# Patient Record
Sex: Male | Born: 1935 | Race: White | Hispanic: No | Marital: Married | Smoking: Never smoker
Health system: Southern US, Community
[De-identification: ages and names within clinical notes are randomized; demographics above are authoritative.]

## PROBLEM LIST (undated history)

## (undated) DIAGNOSIS — I498 Other specified cardiac arrhythmias: Secondary | ICD-10-CM

## (undated) DIAGNOSIS — R001 Bradycardia, unspecified: Secondary | ICD-10-CM

## (undated) DIAGNOSIS — J84112 Idiopathic pulmonary fibrosis: Secondary | ICD-10-CM

## (undated) DIAGNOSIS — R002 Palpitations: Secondary | ICD-10-CM

## (undated) DIAGNOSIS — R911 Solitary pulmonary nodule: Secondary | ICD-10-CM

## (undated) DIAGNOSIS — K219 Gastro-esophageal reflux disease without esophagitis: Secondary | ICD-10-CM

## (undated) DIAGNOSIS — I10 Essential (primary) hypertension: Secondary | ICD-10-CM

## (undated) DIAGNOSIS — Z8551 Personal history of malignant neoplasm of bladder: Secondary | ICD-10-CM

## (undated) DIAGNOSIS — E785 Hyperlipidemia, unspecified: Secondary | ICD-10-CM

## (undated) HISTORY — DX: Other specified cardiac arrhythmias: I49.8

## (undated) HISTORY — PX: CYSTOSCOPY: SUR368

## (undated) HISTORY — DX: Idiopathic pulmonary fibrosis: J84.112

## (undated) HISTORY — DX: Personal history of malignant neoplasm of bladder: Z85.51

## (undated) HISTORY — DX: Essential (primary) hypertension: I10

## (undated) HISTORY — DX: Bradycardia, unspecified: R00.1

## (undated) HISTORY — DX: Hyperlipidemia, unspecified: E78.5

## (undated) HISTORY — DX: Palpitations: R00.2

## (undated) HISTORY — DX: Solitary pulmonary nodule: R91.1

## (undated) HISTORY — DX: Gastro-esophageal reflux disease without esophagitis: K21.9

---

## 2000-03-28 ENCOUNTER — Ambulatory Visit (HOSPITAL_COMMUNITY): Admission: RE | Admit: 2000-03-28 | Discharge: 2000-03-28 | Payer: Self-pay | Admitting: Gastroenterology

## 2006-07-16 ENCOUNTER — Encounter: Admission: RE | Admit: 2006-07-16 | Discharge: 2006-07-16 | Payer: Self-pay | Admitting: Emergency Medicine

## 2006-08-02 ENCOUNTER — Ambulatory Visit: Payer: Self-pay | Admitting: Internal Medicine

## 2006-08-12 ENCOUNTER — Ambulatory Visit (HOSPITAL_COMMUNITY): Admission: RE | Admit: 2006-08-12 | Discharge: 2006-08-12 | Payer: Self-pay | Admitting: Internal Medicine

## 2006-08-14 ENCOUNTER — Ambulatory Visit: Payer: Self-pay | Admitting: Internal Medicine

## 2006-08-26 ENCOUNTER — Ambulatory Visit: Payer: Self-pay | Admitting: Internal Medicine

## 2006-11-25 ENCOUNTER — Ambulatory Visit: Payer: Self-pay | Admitting: Internal Medicine

## 2007-03-06 DIAGNOSIS — J479 Bronchiectasis, uncomplicated: Secondary | ICD-10-CM

## 2007-03-06 DIAGNOSIS — J8410 Pulmonary fibrosis, unspecified: Secondary | ICD-10-CM

## 2007-03-07 ENCOUNTER — Ambulatory Visit: Payer: Self-pay | Admitting: Internal Medicine

## 2007-06-04 ENCOUNTER — Ambulatory Visit: Payer: Self-pay | Admitting: Internal Medicine

## 2007-06-04 DIAGNOSIS — I10 Essential (primary) hypertension: Secondary | ICD-10-CM

## 2007-06-04 DIAGNOSIS — E785 Hyperlipidemia, unspecified: Secondary | ICD-10-CM

## 2007-06-04 DIAGNOSIS — K219 Gastro-esophageal reflux disease without esophagitis: Secondary | ICD-10-CM | POA: Insufficient documentation

## 2007-06-05 ENCOUNTER — Encounter: Payer: Self-pay | Admitting: Internal Medicine

## 2007-12-02 ENCOUNTER — Ambulatory Visit: Payer: Self-pay | Admitting: Internal Medicine

## 2007-12-16 ENCOUNTER — Ambulatory Visit (HOSPITAL_BASED_OUTPATIENT_CLINIC_OR_DEPARTMENT_OTHER): Admission: RE | Admit: 2007-12-16 | Discharge: 2007-12-16 | Payer: Self-pay | Admitting: Internal Medicine

## 2007-12-16 ENCOUNTER — Encounter: Payer: Self-pay | Admitting: Internal Medicine

## 2007-12-27 ENCOUNTER — Ambulatory Visit: Payer: Self-pay | Admitting: Internal Medicine

## 2008-01-01 ENCOUNTER — Ambulatory Visit: Payer: Self-pay | Admitting: Internal Medicine

## 2008-01-01 DIAGNOSIS — R0602 Shortness of breath: Secondary | ICD-10-CM

## 2008-01-15 ENCOUNTER — Telehealth (INDEPENDENT_AMBULATORY_CARE_PROVIDER_SITE_OTHER): Payer: Self-pay | Admitting: *Deleted

## 2008-02-18 ENCOUNTER — Ambulatory Visit: Payer: Self-pay | Admitting: Pulmonary Disease

## 2008-05-18 ENCOUNTER — Ambulatory Visit: Payer: Self-pay | Admitting: Pulmonary Disease

## 2008-07-02 ENCOUNTER — Ambulatory Visit: Payer: Self-pay | Admitting: Internal Medicine

## 2008-07-02 LAB — CONVERTED CEMR LAB
BUN: 15 mg/dL (ref 6–23)
Creatinine, Ser: 1.4 mg/dL (ref 0.4–1.5)
HCT: 42.6 % (ref 39.0–52.0)
MCHC: 35.7 g/dL (ref 30.0–36.0)
MCV: 87.3 fL (ref 78.0–100.0)
Monocytes Absolute: 0.9 10*3/uL (ref 0.1–1.0)
Monocytes Relative: 10 % (ref 3.0–12.0)
Neutrophils Relative %: 58.7 % (ref 43.0–77.0)

## 2008-07-05 ENCOUNTER — Ambulatory Visit: Payer: Self-pay | Admitting: Cardiology

## 2008-07-08 ENCOUNTER — Telehealth: Payer: Self-pay | Admitting: Internal Medicine

## 2008-07-12 ENCOUNTER — Telehealth: Payer: Self-pay | Admitting: Internal Medicine

## 2008-10-20 ENCOUNTER — Encounter (INDEPENDENT_AMBULATORY_CARE_PROVIDER_SITE_OTHER): Payer: Self-pay | Admitting: Urology

## 2008-10-20 ENCOUNTER — Ambulatory Visit (HOSPITAL_COMMUNITY): Admission: RE | Admit: 2008-10-20 | Discharge: 2008-10-20 | Payer: Self-pay | Admitting: Urology

## 2008-12-29 ENCOUNTER — Ambulatory Visit: Payer: Self-pay | Admitting: Internal Medicine

## 2009-01-06 LAB — CONVERTED CEMR LAB
Basophils Absolute: 0 10*3/uL (ref 0.0–0.1)
Basophils Relative: 0.1 % (ref 0.0–3.0)
HCT: 42.5 % (ref 39.0–52.0)
Hemoglobin: 15.1 g/dL (ref 13.0–17.0)
Lymphocytes Relative: 20 % (ref 12.0–46.0)
Lymphs Abs: 1.7 10*3/uL (ref 0.7–4.0)
MCHC: 35.7 g/dL (ref 30.0–36.0)
MCV: 87.7 fL (ref 78.0–100.0)
Monocytes Absolute: 1 10*3/uL (ref 0.1–1.0)
Neutro Abs: 5.5 10*3/uL (ref 1.4–7.7)
RBC: 4.84 M/uL (ref 4.22–5.81)
RDW: 12.8 % (ref 11.5–14.6)
WBC: 8.6 10*3/uL (ref 4.5–10.5)

## 2009-01-12 ENCOUNTER — Ambulatory Visit: Payer: Self-pay | Admitting: Internal Medicine

## 2009-02-28 ENCOUNTER — Ambulatory Visit: Payer: Self-pay | Admitting: Internal Medicine

## 2009-03-17 ENCOUNTER — Encounter: Payer: Self-pay | Admitting: Internal Medicine

## 2009-05-23 ENCOUNTER — Encounter: Payer: Self-pay | Admitting: Internal Medicine

## 2009-06-16 ENCOUNTER — Encounter (HOSPITAL_COMMUNITY): Admission: RE | Admit: 2009-06-16 | Discharge: 2009-09-14 | Payer: Self-pay | Admitting: Internal Medicine

## 2009-06-28 ENCOUNTER — Ambulatory Visit: Payer: Self-pay | Admitting: Internal Medicine

## 2009-07-25 HISTORY — PX: US ECHOCARDIOGRAPHY: HXRAD669

## 2009-07-29 HISTORY — PX: CARDIOVASCULAR STRESS TEST: SHX262

## 2009-08-22 ENCOUNTER — Encounter: Payer: Self-pay | Admitting: Internal Medicine

## 2009-09-15 ENCOUNTER — Encounter (HOSPITAL_COMMUNITY): Admission: RE | Admit: 2009-09-15 | Discharge: 2009-10-12 | Payer: Self-pay | Admitting: Internal Medicine

## 2009-10-18 ENCOUNTER — Encounter (HOSPITAL_COMMUNITY): Admission: RE | Admit: 2009-10-18 | Discharge: 2010-01-16 | Payer: Self-pay | Admitting: Internal Medicine

## 2009-10-31 ENCOUNTER — Ambulatory Visit: Payer: Self-pay | Admitting: Internal Medicine

## 2009-11-03 ENCOUNTER — Encounter: Payer: Self-pay | Admitting: Internal Medicine

## 2009-11-24 ENCOUNTER — Telehealth (INDEPENDENT_AMBULATORY_CARE_PROVIDER_SITE_OTHER): Payer: Self-pay | Admitting: *Deleted

## 2009-11-24 ENCOUNTER — Encounter: Payer: Self-pay | Admitting: Internal Medicine

## 2010-01-04 ENCOUNTER — Encounter: Payer: Self-pay | Admitting: Internal Medicine

## 2010-01-19 ENCOUNTER — Encounter (HOSPITAL_COMMUNITY)
Admission: RE | Admit: 2010-01-19 | Discharge: 2010-04-19 | Payer: Self-pay | Source: Home / Self Care | Admitting: Internal Medicine

## 2010-02-17 ENCOUNTER — Ambulatory Visit: Payer: Self-pay | Admitting: Internal Medicine

## 2010-04-20 ENCOUNTER — Encounter (HOSPITAL_COMMUNITY)
Admission: RE | Admit: 2010-04-20 | Discharge: 2010-06-20 | Payer: Self-pay | Source: Home / Self Care | Attending: Internal Medicine | Admitting: Internal Medicine

## 2010-05-01 ENCOUNTER — Ambulatory Visit: Payer: Self-pay | Admitting: Internal Medicine

## 2010-06-08 ENCOUNTER — Ambulatory Visit
Admission: RE | Admit: 2010-06-08 | Discharge: 2010-06-08 | Payer: Self-pay | Source: Home / Self Care | Attending: Internal Medicine | Admitting: Internal Medicine

## 2010-06-08 DIAGNOSIS — J209 Acute bronchitis, unspecified: Secondary | ICD-10-CM | POA: Insufficient documentation

## 2010-06-20 NOTE — Letter (Signed)
Summary: External Correspondence  External Correspondence   Imported By: Valinda Hoar 11/24/2009 14:04:55  _____________________________________________________________________  External Attachment:    Type:   Image     Comment:   External Document

## 2010-06-20 NOTE — Assessment & Plan Note (Signed)
Summary: 4 months/apc   Primary Provider/Referring Provider:  Lajean Manes  CC:  4 month followup.  Pt states his breathing is the same- no better or worse.  No new complaints today.  Pt states that he has only been using the spiriva as needed.[name]  History of Present Illness:  February 28, 2009- Bronchiectasis, RML Nodule, fibrosis.......................Marland Kitchenwife here Here for PFT review, feeling a little better in cool weather. Travelled to [place] and breathed better in the dry heat. Walked slowly on hills. After late golf game had increased cough overnight. PFT- Mild restriction, moderate reduction of diffusion, but normal flows. - 96%, 95, 97%, 465 m. Compared with PFT of 8/09, restriction is more evident, with pattern the same.  June 28, 2009- Pulmonary Fibrosis, bronchiectasis, RML Nodule..................................[name] Assessed at [facility] on my referral for ILD c/w pulmonary fibrosis. He reports maybe some increased cough with morning phlegm. Maybe a little more dyspnea noted with hills and steps. He says [facility] group felt dx Ideopathic Firosis firm without needing biopsy. He is waiting to hear about participation in a clinical trial. He had flu vax. He isn't sure if Spiriva does anything. He has started [facility]. Denies palpitation, ankle swelling, chest pain.  October 31, 2009- Pulmonary fibrosis, bronchiectasis, RML nodule. He feels stable. Going to Pulmonary Rehab. Also in a double blind clinical drug trial for IPF at [facility]. [facility] program is doing his PFT and CXR regularly. Dyspnea is unchanged. Main c/o is cough, dry, waking him and worse in the morning. Benzonatate not helping. He paces himself and avoids hills to control dyspnea. Plays golf 1-2x/ week.He uses Spiriva when needed.   Current Medications (verified): 1)  Atenolol 25 Mg  Tabs (Atenolol) .... One Qd 2)  Lipitor 80 Mg  Tabs (Atorvastatin Calcium) .... One Qd 3)  Micardis Hct 80-25 Mg  Tabs (Telmisartan-Hctz)  .... One Qd 4)  Prilosec 20 Mg  Cpdr (Omeprazole) .... Take 1 By Mouth Once Daily 5)  Spiriva Handihaler 18 Mcg Caps (Tiotropium Bromide Monohydrate) .... Inhale Contents of 1 Capsule Once A Day As Needed  Allergies (verified): No Known Drug Allergies  Past History:  Past Medical History: Last updated: 06/28/2009 R lung nodule No hx TB exposure/ neg PPD Ideopathic Pulmonaery Fibrosis - [facility]- no biopsy L Hilar node Hypertension Hyperlipidemia G E R D\par Bladder cancer- resected  Past Surgical History: Last updated: 06/28/2009 Bladder cancer resected via cystoscopy  Family History: Last updated: 06/04/2007 Coronary Heart Disease COPD:   Social History: Last updated: 06/04/2007 Patient never smoked.  retired Research officer, political party  Risk Factors: Smoking Status: never (06/04/2007)  Review of Systems      See HPI       The patient complains of shortness of breath with activity and non-productive cough.  The patient denies shortness of breath at rest, productive cough, coughing up blood, chest pain, irregular heartbeats, acid heartburn, indigestion, loss of appetite, weight change, abdominal pain, difficulty swallowing, sore throat, tooth/dental problems, headaches, nasal congestion/difficulty breathing through nose, and sneezing.    Vital Signs:  Patient profile:   76 year old male Weight:      185 pounds O2 Sat:      96 % on Room air Pulse rate:   53 / minute BP sitting:   128 / 74  (left arm)  Vitals Entered By: Vernie Murders (October 31, 2009 2:10 PM)  O2 Flow:  Room air  Physical Exam  Additional Exam:  General: A/Ox3; pleasant and cooperative, NAD, subdued affect,  SKIN: no rash, lesions NODES: no lymphadenopathy HEENT: [place]/AT, EOM- WNL, Conjuctivae- clear, PERRLA, TM-WNL, Nose- clear, Throat- clear and wnl NECK: Supple w/ fair ROM, JVD- none, normal carotid impulses w/o bruits Thyroid-  CHEST:  crackles to level of scapula bilaterally HEART: RRR, no m/g/r  heard ABDOMEN: Soft and nl;  ZOX:WRUE, nl pulses, no edema  NEURO: Grossly intact to observation      Impression & Recommendations:  Problem # 1:  PULMONARY NODULE, RIGHT MIDDLE LOBE (ICD-518.89)  This will be watched for now.He is getting imaged at [facility] for his fibrosis.  Problem # 2:  PULMONARY FIBROSIS (ICD-515) Clinically stable for now on drug trial. Cough is disruptive. Will give cough syurup.We don't document aspiration or other correctable process.  Problem # 3:  G E R D (ICD-530.81) Not describing active symptoms. Potential for aspiration addressed. His updated medication list for this problem includes:    Prilosec 20 Mg Cpdr (Omeprazole) .[name]... Take 1 by mouth once daily  Medications Added to Medication List This Visit: 1)  Spiriva Handihaler 18 Mcg Caps (Tiotropium bromide monohydrate) .... Inhale contents of 1 capsule once a day as needed 2)  Promethazine-codeine 6.25-10 Mg/53ml Syrp (Promethazine-codeine) .[name].. 1 teaspoon .four times a day as needed cough  Other Orders: Est. Patient Level IV (45409)  Patient Instructions: 1)  Please schedule a follow-up appointment in 6 months. 2)  Script for cough syrup to use if needed Prescriptions: PROMETHAZINE-CODEINE 6.25-10 MG/5ML SYRP (PROMETHAZINE-CODEINE) 1 teaspoon .four times a day as needed cough  #200 ml x 1   Entered and Authorized by:   Waymon Budge MD   Signed by:   Waymon Budge MD on 10/31/2009   Method used:   Print then Give to Patient   RxID:   [id number]

## 2010-06-20 NOTE — Assessment & Plan Note (Signed)
Summary: rov 4 months///kp   Primary Provider/Referring Provider:  Lajean Manes  CC:  4 month follow up visit-SOB at times and with Activity..  History of Present Illness:  12/29/08- bronchiectasis, RML nodule, fibrosis...........................Marland Kitchenwife here C/O no energy. Nap makes him feel worse. Coughing more with clear mucus x 2 months, more at night and early AM. Not interfering with sleep. Not much more short of breath. Denies chest pain/ palpitation .Last treadmill with Dr Patty Sermons 2 years ago. Nocturia x 3. Snores a lot. NPSG last year normal- AHI 2.5. Had hematuria- dx'd blader cancer- Dr Isabel Caprice. Treated locally excision/ topical chemo.Plays golf 1 day/ week.  February 28, 2009- Bronchiectasis, RML Nodule, fibrosis.......................Marland Kitchenwife here Here for PFT review, feeling a little better in cool weather. Travelled to [place] and breathed better in the dry heat. Walked slowly on hills. After late golf game had increased cough overnight. PFT- Mild restriction, moderate reduction of diffusion, but normal flows. - 96%, 95, 97%, 465 m. Compared with PFT of 8/09, restriction is more evident, with pattern the same.  June 28, 2009- Pulmonary Fibrosis, bronchiectasis, RML Nodule..................................[name] Assessed at [facility] on my referral for ILD c/w pulmonary fibrosis. He reports maybe some increased cough with morning phlegm. Maybe a little more dyspnea noted with hills and steps. He says [facility] group felt dx Ideopathic Firosis firm without needing biopsy. He is waiting to hear about participation in a clinical trial. He had flu vax. He isn't sure if Spiriva does anything. He has started [facility]. Denies palpitation, ankle swelling, chest pain.  Current Medications (verified): 1)  Atenolol 25 Mg  Tabs (Atenolol) .... One Qd 2)  Lipitor 80 Mg  Tabs (Atorvastatin Calcium) .... One Qd 3)  Micardis Hct 80-25 Mg  Tabs (Telmisartan-Hctz) .... One Qd 4)  Prilosec 20 Mg   Cpdr (Omeprazole) .... Take 1 By Mouth Once Daily 5)  Spiriva Handihaler 18 Mcg Caps (Tiotropium Bromide Monohydrate) .... Inhale Contents of 1 Capsule Once A Day  Allergies (verified): No Known Drug Allergies  Past History:  Family History: Last updated: 06/04/2007 Coronary Heart Disease COPD:   Social History: Last updated: 06/04/2007 Patient never smoked.  retired Research officer, political party  Risk Factors: Smoking Status: never (06/04/2007)  Past Medical History: R lung nodule No hx TB exposure/ neg PPD Ideopathic Pulmonaery Fibrosis - [facility]- no biopsy L Hilar node Hypertension Hyperlipidemia G E R D\par Bladder cancer- resected  Past Surgical History: Bladder cancer resected via cystoscopy  Review of Systems      See HPI       The patient complains of dyspnea on exertion and prolonged cough.  The patient denies anorexia, fever, weight loss, weight gain, vision loss, decreased hearing, hoarseness, chest pain, syncope, peripheral edema, headaches, hemoptysis, abdominal pain, and severe indigestion/heartburn.    Vital Signs:  Patient profile:   76 year old male Height:      68 inches Weight:      190.13 pounds BMI:     29.01 O2 Sat:      96 % on Room air Pulse rate:   57 / minute BP sitting:   132 / 74  (left arm) Cuff size:   regular  Vitals Entered By: Reynaldo Minium CMA (June 28, 2009 9:29 AM)  O2 Flow:  Room air  Physical Exam  Additional Exam:  General: A/Ox3; pleasant and cooperative, NAD, subdued affect,  SKIN: no rash, lesions NODES: no lymphadenopathy HEENT: [place]/AT, EOM- WNL, Conjuctivae- clear, PERRLA, TM-WNL, Nose- clear, Throat- clear  and wnl NECK: Supple w/ fair ROM, JVD- none, normal carotid impulses w/o bruits Thyroid-  CHEST:  crackles to level of scapula bilaterally HEART: RRR, no m/g/r heard ABDOMEN: Soft and nl;  ZOX:WRUE, nl pulses, no edema  NEURO: Grossly intact to observation      Impression & Recommendations:  Problem # 1:   PULMONARY FIBROSIS (ICD-515) IPF, 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 He has heard about single lung transplant and pirfenidone as options. For now he is waiting to hear about acceptance into a study trial at [facility]. I encouraged aerobic exercise, including his golf and the Pulmonary Rehab program. It is ok for him to try Spiriva on and off, a week at a time, to see if he thinks it helps any.  Other Orders: Est. Patient Level III (45409)  Patient Instructions: 1)  Please schedule a follow-up appointment in 4 months. 2)  OK to try Spiriva off and on, a week at a time. If you can't be sure that it helps with shortness of breath, then it probably isn't worth the money.

## 2010-06-20 NOTE — Letter (Signed)
Summary: Pulmonary/[facility]  Pulmonary/[facility]   Imported By: Sherian Rein 08/22/2009 12:06:12  _____________________________________________________________________  External Attachment:    Type:   Image     Comment:   External Document

## 2010-06-20 NOTE — Progress Notes (Signed)
Summary: Handicapped Placard forms  Phone Note Call from Patient   Caller: Patient Call For: [name] [name] Summary of Call: Pt came in; waiting in lobby to have Handicapped placard signed by CDY. attached to this note. Initial call taken by: Reynaldo Minium CMA,  November 24, 2009 11:24 AM  Follow-up for Phone Call        placard signed  and given to patient.  -Copy yo be scanned in chart .Kandice Hams [facility]  November 24, 2009 11:43 AM  Follow-up by: Kandice Hams CMA,  November 24, 2009 11:43 AM

## 2010-06-20 NOTE — Letter (Signed)
Summary: Pulmonology/[facility]  Pulmonology/[facility]   Imported By: Lester [place] 06/01/2009 11:05:30  _____________________________________________________________________  External Attachment:    Type:   Image     Comment:   External Document

## 2010-06-20 NOTE — Consult Note (Signed)
Summary: Pulmonary[facility]  Pulmonary/[facility]   Imported By: Lester [place] 11/11/2009 08:16:46  _____________________________________________________________________  External Attachment:    Type:   Image     Comment:   External Document

## 2010-06-20 NOTE — Assessment & Plan Note (Signed)
Summary: flu shot/ mbw  Nurse Visit   Allergies: No Known Drug Allergies  Orders Added: 1)  Flu Vaccine 67yrs + MEDICARE PATIENTS [Q2039] 2)  Administration Flu vaccine - MCR [G0008] Flu Vaccine Consent Questions     Do you have a history of severe allergic reactions to this vaccine? no    Any prior history of allergic reactions to egg and/or gelatin? no    Do you have a sensitivity to the preservative Thimersol? no    Do you have a past history of Guillan-Barre Syndrome? no    Do you currently have an acute febrile illness? no    Have you ever had a severe reaction to latex? no    Vaccine information given and explained to patient? yes    Are you currently pregnant? no    Lot Number:[id number]   Exp Date:11/18/2010   Site Given  Left Deltoid IMmedflu   Tammy Scott  February 20, 2010 9:40 AM   Appended Document: flu shot/ mbw     Allergies: No Known Drug Allergies     correct lot# for flu shot [id number] given 02/20/2010  Tammy Scott  February 20, 2010 5:11 PM

## 2010-06-20 NOTE — Consult Note (Signed)
Summary: Pulmonary/[facility]  Pulmonary/[facility]   Imported By: Sherian Rein 01/10/2010 11:36:25  _____________________________________________________________________  External Attachment:    Type:   Image     Comment:   External Document

## 2010-06-22 ENCOUNTER — Encounter (HOSPITAL_COMMUNITY): Payer: Medicare Other | Attending: Internal Medicine

## 2010-06-22 DIAGNOSIS — R0989 Other specified symptoms and signs involving the circulatory and respiratory systems: Secondary | ICD-10-CM | POA: Insufficient documentation

## 2010-06-22 DIAGNOSIS — R0609 Other forms of dyspnea: Secondary | ICD-10-CM | POA: Insufficient documentation

## 2010-06-22 DIAGNOSIS — E785 Hyperlipidemia, unspecified: Secondary | ICD-10-CM | POA: Insufficient documentation

## 2010-06-22 DIAGNOSIS — J8410 Pulmonary fibrosis, unspecified: Secondary | ICD-10-CM | POA: Insufficient documentation

## 2010-06-22 DIAGNOSIS — I10 Essential (primary) hypertension: Secondary | ICD-10-CM | POA: Insufficient documentation

## 2010-06-22 DIAGNOSIS — R05 Cough: Secondary | ICD-10-CM | POA: Insufficient documentation

## 2010-06-22 DIAGNOSIS — J90 Pleural effusion, not elsewhere classified: Secondary | ICD-10-CM | POA: Insufficient documentation

## 2010-06-22 DIAGNOSIS — K219 Gastro-esophageal reflux disease without esophagitis: Secondary | ICD-10-CM | POA: Insufficient documentation

## 2010-06-22 DIAGNOSIS — R059 Cough, unspecified: Secondary | ICD-10-CM | POA: Insufficient documentation

## 2010-06-22 DIAGNOSIS — Z5189 Encounter for other specified aftercare: Secondary | ICD-10-CM | POA: Insufficient documentation

## 2010-06-22 NOTE — Assessment & Plan Note (Signed)
Summary: Acute NP office visit - bronchitis   Primary Provider/Referring Provider:  Lajean Manes  CC:  prod cough with yellow mucus, wheezing, increased SOB, sore throat, and x2weeks - denies f/c/s.  History of Present Illness: June 28, 2009- Pulmonary Fibrosis, bronchiectasis, RML Nodule..................................[name] Assessed at [facility] on my referral for ILD c/w pulmonary fibrosis. He reports maybe some increased cough with morning phlegm. Maybe a little more dyspnea noted with hills and steps. He says [facility] group felt dx Ideopathic Firosis firm without needing biopsy. He is waiting to hear about participation in a clinical trial. He had flu vax. He isn't sure if Spiriva does anything. He has started [facility]. Denies palpitation, ankle swelling, chest pain.  October 31, 2009- Pulmonary fibrosis, bronchiectasis, RML nodule. He feels stable. Going to Pulmonary Rehab. Also in a double blind clinical drug trial for IPF at [facility]. [facility] program is doing his PFT and CXR regularly. Dyspnea is unchanged. Main c/o is cough, dry, waking him and worse in the morning. Benzonatate not helping. He paces himself and avoids hills to control dyspnea. Plays golf 1-2x/ week.He uses Spiriva when needed.  May 01, 2010-  Pulmonary fibrosis, bronchiectasis, RML nodule. Nurse-CC: 6 month follow up visit-Pulmonary Fibrosis; increased SOB with activity-nothing makes it better. He feels stable, playing golf once weekly and pacing himself on level ground. Stilll goes to [facility] twice weekly. He continues with an IPF study trial at [facility], being given a 2 hour IV infusion once monthly. That will finish in March and he may chosse to participate in the local Perfenadone trial next. He feels he is holding his own in terms of response to the [facility] drug. Dry cough is his biggest complaint. Brother now being evaluated for IPF.  June 08, 2010 --Presents for an acute office visit. Complains of prod  cough with yellow mucus, wheezing, increased SOB, sore throat, x2weeks. OTC not helping Denies chest pain,  orthopnea, hemoptysis, fever, n/v/d, edema, headache,recent travel or antibiotics. Use cough syrup but cough is keeping him up.      Medications Prior to Update: 1)  Atenolol 25 Mg  Tabs (Atenolol) .... One Qd 2)  Lipitor 80 Mg  Tabs (Atorvastatin Calcium) .... One Qd 3)  Micardis Hct 80-25 Mg  Tabs (Telmisartan-Hctz) .... One Qd 4)  Prilosec 20 Mg  Cpdr (Omeprazole) .... Take 1 By Mouth Once Daily 5)  Spiriva Handihaler 18 Mcg Caps (Tiotropium Bromide Monohydrate) .... Inhale Contents of 1 Capsule Once A Day As Needed 6)  Promethazine-Codeine 6.25-10 Mg/71ml Syrp (Promethazine-Codeine) .[name].. 1 Teaspoon .four Times A Day As Needed Cough  Current Medications (verified): 1)  Atenolol 25 Mg  Tabs (Atenolol) .... Take 1 Tablet By Mouth Once A Day 2)  Lipitor 80 Mg  Tabs (Atorvastatin Calcium) .... Take 1 Tab By Mouth At Bedtime 3)  Micardis Hct 80-25 Mg  Tabs (Telmisartan-Hctz) .... Take 1 Tablet By Mouth Once A Day 4)  Prilosec 20 Mg  Cpdr (Omeprazole) .... Take 1 By Mouth Before Breakfast 5)  Spiriva Handihaler 18 Mcg Caps (Tiotropium Bromide Monohydrate) .... Inhale Contents of 1 Capsule Once A Day As Needed 6)  Promethazine-Codeine 6.25-10 Mg/101ml Syrp (Promethazine-Codeine) .[name].. 1 Teaspoon .four Times A Day As Needed Cough  Allergies (verified): No Known Drug Allergies  Past History:  Past Medical History: Last updated: 05/01/2010 R lung nodule No hx TB exposure/ neg PPD Ideopathic Pulmonaery Fibrosis - [facility] study - no biopsy L Hilar node Hypertension Hyperlipidemia G E  R D\par Bladder cancer- resected  Past Surgical History: Last updated: 06/28/2009 Bladder cancer resected via cystoscopy  Family History: Last updated: 05/01/2010 Coronary Heart Disease COPD:  Brother- IPF  Social History: Last updated: 06/08/2010 Patient never smoked.  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 No Comments Medications reviewed with patient Daytime contact number verified with patient. Boone Master CNA/MA  June 08, 2010 4:19 PM    Physical Exam  Additional Exam:  General: A/Ox3; pleasant and cooperative, NAD SKIN: no rash, lesions NODES: no lymphadenopathy HEENT: [place]/AT, EOM- WNL, Conjuctivae- clear, PERRLA, TM-WNL, Nose- clear, Throat- clear and wnl NECK: Supple w/ fair ROM, JVD- none, normal carotid impulses w/o bruits Thyroid-  CHEST:  crackles to level of scapula bilaterally. Dry cough with deep breath.  HEART: RRR, no m/g/r heard ABDOMEN: Soft and nl;  UJW:JXBJ, nl pulses, no edema  NEURO: Grossly intact to observation      Impression & Recommendations:  Problem # 1:  BRONCHITIS, ACUTE (ICD-466.0) Flare :  albuterol neb  Augmentin 875mg  two times a day for 7days with food Mucinex DM two times a day as needed  Prednisone taper over next week.  Please contact office for sooner follow up if symptoms do not improve or worsen  follow up Dr. Maple Hudson as planned and as needed  His updated medication list for this problem includes:    Spiriva Handihaler 18 Mcg Caps (Tiotropium bromide monohydrate) ..... Inhale contents of 1 capsule once a day as needed     Promethazine-codeine 6.25-10 Mg/43ml Syrp (Promethazine-codeine) .[name]... 1 teaspoon .four times a day as needed cough    Augmentin 875-125 Mg Tabs (Amoxicillin-pot clavulanate) .[name]... 1 by mouth two times a day  Orders: Est. Patient Level IV (47829)  Medications Added to Medication List This Visit: 1)  Atenolol 25 Mg Tabs (Atenolol) .... Take 1 tablet by mouth once a day 2)  Lipitor 80 Mg Tabs (Atorvastatin calcium) .... Take 1 tab by mouth at bedtime 3)  Micardis Hct 80-25 Mg Tabs (Telmisartan-hctz) .... Take 1 tablet by mouth once a day 4)  Prilosec 20 Mg Cpdr (Omeprazole) .... Take 1 by mouth before breakfast 5)  Augmentin 875-125 Mg Tabs (Amoxicillin-pot clavulanate) .[name].. 1 by mouth two times a day 6)  Prednisone 10 Mg Tabs (Prednisone) .... 4 tabs for 2 days, then 3 tabs for 2 days, 2 tabs for 2 days, then 1 tab for 2 days, then stop  Patient Instructions: 1)  Augmentin 875mg  two times a day for 7days with food 2)  Mucinex DM two times a day as needed  3)  Prednisone taper over next week.  4)  Please contact office for sooner follow up if symptoms do not improve or worsen  5)  follow up Dr. Maple Hudson as planned and as needed  Prescriptions: PREDNISONE 10 MG TABS (PREDNISONE) 4 tabs for 2 days, then 3 tabs for 2 days, 2 tabs for 2 days, then 1 tab for 2 days, then stop  #20 x 0   Entered and Authorized by:   Rubye Oaks NP   Signed by:   Tammy Parrett NP on 06/08/2010   Method used:   Electronically to        [facility] [address] [id number] (retail)       [address]       [place], [place]  [address]       Ph: [phone]       Fax: [phone]   RxID:   [id number] AUGMENTIN 875-125 MG TABS (AMOXICILLIN-POT CLAVULANATE) 1 by mouth two times a day  #14 x 0   Entered and Authorized by:   Rubye Oaks NP   Signed by:   Tammy Parrett NP on 06/08/2010   Method used:   [facility]        [facility]  [address] [id number]       [address]       [place], [place]  [address]       Ph: [phone]       Fax: [phone]   RxID:   [id number]     Appended Document: Orders Update - neb tx    Clinical Lists Changes  Orders: Added new Service order of Nebulizer Tx (83151) - Signed Added new Service order of Albuterol Sulfate Sol 1mg  unit dose (V6160) - Signed       Medication Administration  Medication # 1:    Medication: Albuterol Sulfate Sol 1mg  unit dose    Diagnosis: BRONCHITIS, ACUTE (ICD-466.0)    Dose: 1 vial    Route: inhaled    Exp Date: 05-2011    Lot #: [id number]    Mfr: nephron    Patient tolerated medication without complications    Given by: Boone Master CNA/MA (June 08, 2010 4:59 PM)  Orders Added: 1)  Nebulizer Tx [73710] 2)  Albuterol Sulfate Sol 1mg  unit dose [G2694]

## 2010-06-22 NOTE — Assessment & Plan Note (Signed)
Summary: rov 6 months///kp   Primary Provider/Referring Provider:  Lajean Manes  CC:  6 month follow up visit-Pulmonary Fibrosis; increased SOB with activity-nothing makes it better.[name] [name]  History of Present Illness: June 28, 2009- Pulmonary Fibrosis, bronchiectasis, RML Nodule..................................[name] [name] Assessed at [facility] on my referral for ILD c/w pulmonary fibrosis. He reports maybe some increased cough with morning phlegm. Maybe a little more dyspnea noted with hills and steps. He says [facility] group felt dx Ideopathic Firosis firm without needing biopsy. He is waiting to hear about participation in a clinical trial. He had flu vax. He isn't sure if Spiriva does anything. He has started [facility]. Denies palpitation, ankle swelling, chest pain.  October 31, 2009- Pulmonary fibrosis, bronchiectasis, RML nodule. He feels stable. Going to Pulmonary Rehab. Also in a double blind clinical drug trial for IPF at [facility]. [facility] program is doing his PFT and CXR regularly. Dyspnea is unchanged. Main c/o is cough, dry, waking him and worse in the morning. Benzonatate not helping. He paces himself and avoids hills to control dyspnea. Plays golf 1-2x/ week.He uses Spiriva when needed.  May 01, 2010-  Pulmonary fibrosis, bronchiectasis, RML nodule. Nurse-CC: 6 month follow up visit-Pulmonary Fibrosis; increased SOB with activity-nothing makes it better. He feels stable, playing golf once weekly and pacing himself on level ground. Stilll goes to [facility] twice weekly. He continues with an IPF study trial at [facility], being given a 2 hour IV infusion once monthly. That will finish in March and he may chosse to participate in the local Perfenadone trial next. He feels he is holding his own in terms of response to the [facility] drug. Dry cough is his biggest complaint. Brother now being evaluated for IPF.     Preventive Screening-Counseling & Management  Alcohol-Tobacco     Smoking Status:  never  Current Medications (verified): 1)  Atenolol 25 Mg  Tabs (Atenolol) .... One Qd 2)  Lipitor 80 Mg  Tabs (Atorvastatin Calcium) .... One Qd 3)  Micardis Hct 80-25 Mg  Tabs (Telmisartan-Hctz) .... One Qd 4)  Prilosec 20 Mg  Cpdr (Omeprazole) .... Take 1 By Mouth Once Daily 5)  Spiriva Handihaler 18 Mcg Caps (Tiotropium Bromide Monohydrate) .... Inhale Contents of 1 Capsule Once A Day As Needed 6)  Promethazine-Codeine 6.25-10 Mg/55ml Syrp (Promethazine-Codeine) .[name] [name].. 1 Teaspoon .four Times A Day As Needed Cough  Allergies (verified): No Known Drug Allergies  Past History:  Past Surgical History: Last updated: 06/28/2009 Bladder cancer resected via cystoscopy  Family History: Last updated: 05/01/2010 Coronary Heart Disease COPD:  Brother- IPF  Social History: Last updated: 06/04/2007 Patient never smoked.  retired Research officer, political party  Risk Factors: Smoking Status: never (05/01/2010)  Past Medical History: R lung nodule No hx TB exposure/ neg PPD Ideopathic Pulmonaery Fibrosis - [facility] study - no biopsy L Hilar node Hypertension Hyperlipidemia G E R D\par Bladder cancer- resected  Family History: Coronary Heart Disease COPD:  Brother- IPF  Review of Systems      See HPI       The patient complains of shortness of breath with activity and non-productive cough.  The patient denies shortness of breath at rest, productive cough, coughing up blood, chest pain, irregular heartbeats, acid heartburn, indigestion, loss of appetite, weight change, abdominal pain, difficulty swallowing, sore throat, tooth/dental problems, headaches, nasal congestion/difficulty breathing through nose, and sneezing.    Vital Signs:  Patient profile:   76 year old male Height:      68 inches Weight:  191.38 pounds BMI:     29.20 O2 Sat:      98 % on Room air Pulse rate:   56 / minute BP sitting:   140 / 76  (left arm) Cuff size:   regular  Vitals Entered By: Reynaldo Minium CMA (May 01, 2010 9:59 AM)  O2 Flow:  Room air CC: 6 month follow up visit-Pulmonary Fibrosis; increased SOB with activity-nothing makes it better.   Physical Exam  Additional Exam:  General: A/Ox3; pleasant and cooperative, NAD, subdued affect,  SKIN: no rash, lesions NODES: no lymphadenopathy HEENT: [place]/AT, EOM- WNL, Conjuctivae- clear, PERRLA, TM-WNL, Nose- clear, Throat- clear and wnl NECK: Supple w/ fair ROM, JVD- none, normal carotid impulses w/o bruits Thyroid-  CHEST:  crackles to level of scapula bilaterally. Dry cough with deep breath.  HEART: RRR, no m/g/r heard ABDOMEN: Soft and nl;  GEX:BMWU, nl pulses, no edema  NEURO: Grossly intact to observation      Impression & Recommendations:  Problem # 1:  PULMONARY FIBROSIS (ICD-515) He has had numerous protocol driven PFTs and imaging studies as well as 6 MWTs done with his study trial particapation at [facility]. We are not being given those results, but his condition isn't changing enough to require [place] to test him here. He is also aware that lung transplant may be an option.   Problem # 2:  PULMONARY NODULE, RIGHT MIDDLE LOBE (ICD-518.89)  We are not going to do extra imaging beyond [facility] surveillance.   Other Orders: Est. Patient Level III (13244)  Patient Instructions: 1)  Please schedule a follow-up appointment in 6 months. 2)  Continue with the protocol trial at [facility]. 3)  If you are interested, the local Perfenadone study trial group can talk with you about participating in that sudy when you finish this one. 4)  Script for cough syrup refilled.  Prescriptions: PROMETHAZINE-CODEINE 6.25-10 MG/5ML SYRP (PROMETHAZINE-CODEINE) 1 teaspoon .four times a day as needed cough  #200 ml x 1   Entered and Authorized by:   Waymon Budge MD   Signed by:   Waymon Budge MD on 05/01/2010   Method used:   Print then Give to Patient   RxID:   [id number]

## 2010-06-27 ENCOUNTER — Encounter (HOSPITAL_COMMUNITY): Payer: Medicare Other

## 2010-06-29 ENCOUNTER — Encounter (HOSPITAL_COMMUNITY): Payer: Medicare Other

## 2010-07-04 ENCOUNTER — Encounter (HOSPITAL_COMMUNITY): Payer: Medicare Other

## 2010-07-06 ENCOUNTER — Encounter (HOSPITAL_COMMUNITY): Payer: Medicare Other

## 2010-07-11 ENCOUNTER — Encounter (HOSPITAL_COMMUNITY): Payer: Medicare Other

## 2010-07-13 ENCOUNTER — Encounter (HOSPITAL_COMMUNITY): Payer: Medicare Other

## 2010-07-18 ENCOUNTER — Encounter (HOSPITAL_COMMUNITY): Payer: Medicare Other

## 2010-07-20 ENCOUNTER — Encounter (HOSPITAL_COMMUNITY): Payer: Federal, State, Local not specified - PPO

## 2010-07-25 ENCOUNTER — Encounter (HOSPITAL_COMMUNITY): Payer: Federal, State, Local not specified - PPO | Attending: Internal Medicine

## 2010-07-25 DIAGNOSIS — R05 Cough: Secondary | ICD-10-CM | POA: Insufficient documentation

## 2010-07-25 DIAGNOSIS — J90 Pleural effusion, not elsewhere classified: Secondary | ICD-10-CM | POA: Insufficient documentation

## 2010-07-25 DIAGNOSIS — E785 Hyperlipidemia, unspecified: Secondary | ICD-10-CM | POA: Insufficient documentation

## 2010-07-25 DIAGNOSIS — R0609 Other forms of dyspnea: Secondary | ICD-10-CM | POA: Insufficient documentation

## 2010-07-25 DIAGNOSIS — I10 Essential (primary) hypertension: Secondary | ICD-10-CM | POA: Insufficient documentation

## 2010-07-25 DIAGNOSIS — J8410 Pulmonary fibrosis, unspecified: Secondary | ICD-10-CM | POA: Insufficient documentation

## 2010-07-25 DIAGNOSIS — R0989 Other specified symptoms and signs involving the circulatory and respiratory systems: Secondary | ICD-10-CM | POA: Insufficient documentation

## 2010-07-25 DIAGNOSIS — K219 Gastro-esophageal reflux disease without esophagitis: Secondary | ICD-10-CM | POA: Insufficient documentation

## 2010-07-25 DIAGNOSIS — R059 Cough, unspecified: Secondary | ICD-10-CM | POA: Insufficient documentation

## 2010-07-25 DIAGNOSIS — Z5189 Encounter for other specified aftercare: Secondary | ICD-10-CM | POA: Insufficient documentation

## 2010-07-27 ENCOUNTER — Encounter (HOSPITAL_COMMUNITY): Payer: Federal, State, Local not specified - PPO

## 2010-07-28 ENCOUNTER — Other Ambulatory Visit: Payer: Self-pay | Admitting: Family Medicine

## 2010-07-28 ENCOUNTER — Ambulatory Visit
Admission: RE | Admit: 2010-07-28 | Discharge: 2010-07-28 | Disposition: A | Discharge status: Home / Self Care | Payer: Medicare Other | Source: Ambulatory Visit | Attending: Family Medicine | Admitting: Family Medicine

## 2010-07-31 ENCOUNTER — Other Ambulatory Visit: Payer: Self-pay

## 2010-08-01 ENCOUNTER — Encounter (HOSPITAL_COMMUNITY): Payer: Federal, State, Local not specified - PPO

## 2010-08-03 ENCOUNTER — Encounter (HOSPITAL_COMMUNITY): Payer: Federal, State, Local not specified - PPO

## 2010-08-08 ENCOUNTER — Encounter (HOSPITAL_COMMUNITY): Payer: Federal, State, Local not specified - PPO

## 2010-08-10 ENCOUNTER — Encounter (HOSPITAL_COMMUNITY): Payer: Federal, State, Local not specified - PPO

## 2010-08-15 ENCOUNTER — Encounter (HOSPITAL_COMMUNITY): Payer: Federal, State, Local not specified - PPO

## 2010-08-17 ENCOUNTER — Encounter (HOSPITAL_COMMUNITY): Payer: Federal, State, Local not specified - PPO

## 2010-08-22 ENCOUNTER — Encounter (HOSPITAL_COMMUNITY): Payer: Federal, State, Local not specified - PPO | Attending: Internal Medicine

## 2010-08-22 DIAGNOSIS — Z5189 Encounter for other specified aftercare: Secondary | ICD-10-CM | POA: Insufficient documentation

## 2010-08-22 DIAGNOSIS — J90 Pleural effusion, not elsewhere classified: Secondary | ICD-10-CM | POA: Insufficient documentation

## 2010-08-22 DIAGNOSIS — R0989 Other specified symptoms and signs involving the circulatory and respiratory systems: Secondary | ICD-10-CM | POA: Insufficient documentation

## 2010-08-22 DIAGNOSIS — J8410 Pulmonary fibrosis, unspecified: Secondary | ICD-10-CM | POA: Insufficient documentation

## 2010-08-22 DIAGNOSIS — E785 Hyperlipidemia, unspecified: Secondary | ICD-10-CM | POA: Insufficient documentation

## 2010-08-22 DIAGNOSIS — I10 Essential (primary) hypertension: Secondary | ICD-10-CM | POA: Insufficient documentation

## 2010-08-22 DIAGNOSIS — K219 Gastro-esophageal reflux disease without esophagitis: Secondary | ICD-10-CM | POA: Insufficient documentation

## 2010-08-22 DIAGNOSIS — R059 Cough, unspecified: Secondary | ICD-10-CM | POA: Insufficient documentation

## 2010-08-22 DIAGNOSIS — R0609 Other forms of dyspnea: Secondary | ICD-10-CM | POA: Insufficient documentation

## 2010-08-22 DIAGNOSIS — R05 Cough: Secondary | ICD-10-CM | POA: Insufficient documentation

## 2010-08-24 ENCOUNTER — Encounter (HOSPITAL_COMMUNITY): Payer: Federal, State, Local not specified - PPO

## 2010-08-28 LAB — BASIC METABOLIC PANEL
BUN: 13 mg/dL (ref 6–23)
Calcium: 9 mg/dL (ref 8.4–10.5)
Creatinine, Ser: 1.53 mg/dL — ABNORMAL HIGH (ref 0.4–1.5)
Glucose, Bld: 92 mg/dL (ref 70–99)
Potassium: 3.9 mEq/L (ref 3.5–5.1)
Sodium: 132 mEq/L — ABNORMAL LOW (ref 135–145)

## 2010-08-29 ENCOUNTER — Encounter (HOSPITAL_COMMUNITY): Payer: Federal, State, Local not specified - PPO

## 2010-08-31 ENCOUNTER — Encounter (HOSPITAL_COMMUNITY): Payer: Federal, State, Local not specified - PPO

## 2010-09-05 ENCOUNTER — Encounter (HOSPITAL_COMMUNITY): Payer: Federal, State, Local not specified - PPO

## 2010-09-07 ENCOUNTER — Encounter (HOSPITAL_COMMUNITY): Payer: Federal, State, Local not specified - PPO

## 2010-09-12 ENCOUNTER — Encounter (HOSPITAL_COMMUNITY): Payer: Federal, State, Local not specified - PPO

## 2010-09-14 ENCOUNTER — Encounter (HOSPITAL_COMMUNITY): Payer: Federal, State, Local not specified - PPO

## 2010-09-19 ENCOUNTER — Encounter (HOSPITAL_COMMUNITY): Payer: Medicare Other | Attending: Internal Medicine

## 2010-09-19 DIAGNOSIS — R0609 Other forms of dyspnea: Secondary | ICD-10-CM | POA: Insufficient documentation

## 2010-09-19 DIAGNOSIS — R05 Cough: Secondary | ICD-10-CM | POA: Insufficient documentation

## 2010-09-19 DIAGNOSIS — Z5189 Encounter for other specified aftercare: Secondary | ICD-10-CM | POA: Insufficient documentation

## 2010-09-19 DIAGNOSIS — I10 Essential (primary) hypertension: Secondary | ICD-10-CM | POA: Insufficient documentation

## 2010-09-19 DIAGNOSIS — K219 Gastro-esophageal reflux disease without esophagitis: Secondary | ICD-10-CM | POA: Insufficient documentation

## 2010-09-19 DIAGNOSIS — J90 Pleural effusion, not elsewhere classified: Secondary | ICD-10-CM | POA: Insufficient documentation

## 2010-09-19 DIAGNOSIS — R059 Cough, unspecified: Secondary | ICD-10-CM | POA: Insufficient documentation

## 2010-09-19 DIAGNOSIS — J8410 Pulmonary fibrosis, unspecified: Secondary | ICD-10-CM | POA: Insufficient documentation

## 2010-09-19 DIAGNOSIS — E785 Hyperlipidemia, unspecified: Secondary | ICD-10-CM | POA: Insufficient documentation

## 2010-09-19 DIAGNOSIS — R0989 Other specified symptoms and signs involving the circulatory and respiratory systems: Secondary | ICD-10-CM | POA: Insufficient documentation

## 2010-09-21 ENCOUNTER — Encounter (HOSPITAL_COMMUNITY): Payer: Medicare Other

## 2010-09-26 ENCOUNTER — Encounter (HOSPITAL_COMMUNITY): Payer: Medicare Other

## 2010-09-28 ENCOUNTER — Encounter (HOSPITAL_COMMUNITY): Payer: Medicare Other

## 2010-09-29 ENCOUNTER — Telehealth: Payer: Self-pay | Admitting: Internal Medicine

## 2010-09-29 MED ORDER — PROMETHAZINE-CODEINE 6.25-10 MG/5ML PO SYRP: ORAL_SOLUTION | ORAL | Status: DC

## 2010-09-29 NOTE — Telephone Encounter (Signed)
OK to refill phenergan codeine cough syrup this time.

## 2010-09-29 NOTE — Telephone Encounter (Signed)
Spoke w/ pt wife and she is aware rx was sent to pharmacy. Nothing further was needed

## 2010-09-29 NOTE — Telephone Encounter (Signed)
Pt requesting refill on promethazine- codeine cough syrup. Pt was last seen 05/01/10 and was giving rx x 1 refill. Pt next OV is 10/30/10 for follow up. Please advise Dr. Maple Hudson if okay to refill. Thanks.  Carver Fila, CMA

## 2010-09-29 NOTE — Telephone Encounter (Signed)
Spoke w/ [facility] [address] and rx was called into pharmacy.   LMOMTCB X1 to advised of this

## 2010-09-29 NOTE — Telephone Encounter (Signed)
Spouse phoned stated she was returning a call to triage she can be reached at [phone].[name] [name]

## 2010-10-03 ENCOUNTER — Encounter (HOSPITAL_COMMUNITY): Payer: Medicare Other

## 2010-10-03 NOTE — Assessment & Plan Note (Signed)
[facility]                             PULMONARY OFFICE NOTE   NAME:Toste, [name] [name]                  MRN:          [id number]  DATE:03/07/2007                            DOB:          [birth year]    PROBLEMS:  1. Right middle lobe nodule.  2. Bronchiectasis/fibrosis.  3. Left hilar adenopathy.   HISTORY:  He describes an off and on nagging pain in the left anterior  costal line that seems more related to indigestion than to exertion or  anything cardiac. It also tends to come on when he is coughing which  happens especially at night. CT scan at [facility] on 11/28/2006 had  shown stable appearance in comparison with March 2008, particularly in  reference to a right middle lobe nodule with no new findings, some  interstitial fibrosis, and bronchiectasis were stable as well.   MEDICATIONS:  1. Atenolol 50 mg.  2. Lipitor 80 mg.  3. Micardis 80/25.   ALLERGIES:  No medication allergy.   He had pneumococcal vaccine about two years ago, but wants flu shot.   OBJECTIVE:  Weight 187 pounds, blood pressure 122/70, pulse 49, room air  saturation 98%. Minor cough. No rhonchi or dullness. No rub. I find no  adenopathy. Heart sounds are unremarkable.   IMPRESSION:  Right lung nodule with mild degrees of bronchiectasis and  fibrosis. Musculoskeletal chest wall pain.   PLAN:  1. Reflux precautions.  2. Flu vaccine.  3. We will get a chest x-ray in January, scheduling return for then.     Clinton D. Maple Hudson, MD, [facility] Bollman, FACP  Electronically Signed    CDY/MedQ  DD: 03/08/2007  DT: 03/10/2007  Job #: [id number]   cc:   Oley Balm. Georgina Pillion, M.D.

## 2010-10-03 NOTE — Assessment & Plan Note (Signed)
[facility]                             PULMONARY OFFICE NOTE   NAME:[name] [name] BOSHER                  MRN:          [id number]  DATE:11/25/2006                            DOB:          [birth year]    PROBLEM:  1. Right middle lobe nodule.  2. Bronchiectasis/fibrosis.  3. Left hilar adenopathy.   HISTORY:  He is tolerating the current weather comfortably.  Notices  pains in his lateral ribs and flanks each time after he plays golf with  a strong implication that it is related to his exertion and twisting.  There has been no sputum, no exertional type chest pain otherwise.   MEDICATIONS:  1. Atenolol 25 mg.  2. Lipitor 80 mg.  3. Micardis 80/2.5.   ALLERGIES:  No medication allergy.   OBJECTIVE:  Weight 186 pounds.  BP 134/72, pulse 54, room air saturation  100%.  Mild cough with forced expiration.  Few bibasilar crackles.  No adenopathy, edema, cyanosis or clubbing.  HEART:  Sounds regular without murmur.   A PET scan done August 12, 2006 was nonspecific for nodule in the right  lung, left hilar node.  Radiologist suspected the uptake was reactive  more likely than malignant but he suggested a followup CT scan in 3 to 6  months.  This was described and discussed with the patient.   IMPRESSION:  1. Lung nodules.  2. Pulmonary fibrosis.   PLAN:  Followup CT scan of the chest with high resolution views to be  done at [name] for comparison with their baseline study  done July 25, 2006.  Schedule a return in 3 months, earlier p.r.n.     [name] D. Maple Hudson, MD, [name], FACP  Electronically Signed   CDY/MedQ  DD: 11/27/2006  DT: 11/28/2006  Job #: [id number]   cc:   [name], M.D.

## 2010-10-03 NOTE — Procedures (Signed)
NAME:  [name] [name], [name] [name] NO.:  [id number]   MEDICAL RECORD NO.:  [id number]          PATIENT TYPE:  OUT   LOCATION:  SLEEP CENTER                 FACILITY:  [facility]   PHYSICIAN:  Clinton D. Maple Hudson, MD, FCCP, FACPDATE OF BIRTH:  12/21/1936   DATE OF STUDY:  12/16/2007                            NOCTURNAL POLYSOMNOGRAM   REFERRING PHYSICIAN:  Clinton D. Maple Hudson, MD, FCCP, FACP   REFERRING PHYSICIAN:  Clinton D. Maple Hudson, MD, FCCP, FACP.   INDICATION FOR STUDY:  Hypersomnia with sleep apnea.   EPWORTH SLEEPINESS SCORE:  8/24.  BMI 27.7, weight 182 pounds, height 68  inches, neck 18 inches.   HOME MEDICATIONS:  Charted and reviewed.   SLEEP ARCHITECTURE:  Total sleep time 362.5 minutes with sleep  deficiency 80.1%.  Stage I was 6.9%, stage II 75.2%, stage III 0.1%, REM  17.8% of total sleep time.  Sleep latency 45 minutes, REM latency 111  minutes.  Awake after sleep onset 46 minutes.  Arousal index 8.9.  No  bedtime medication taken.   RESPIRATORY DATA:  Apnea hypopnea index (AHI) 2.5 per hour.  Fifteen  events were scored, including one obstructive apnea and 14 hypopneas.  Events were more common while supine.  REM AHI 8.4.  Respiratory  disturbance index (RDI) was 4.8.  There were insufficient events to  permit CPAP titration by split protocol on this study night.   OXYGEN DATA:  Moderate snoring with oxygen desaturation to a nadir of  90%.  Mean oxygen saturation through the study was 95.6% on room air.   CARDIAC DATA:  Normal sinus rhythm.   MOVEMENT-PARASOMNIA:  No significant movement disturbance.  Bathroom x2.   IMPRESSIONS-RECOMMENDATIONS:  1. Occasional respiratory events disturbing sleep, AHI 2.5 per hour      (normal range 0-5 per hour).  2. Unremarkable sleep architecture for sleep study environment.      Clinton D. Maple Hudson, MD, [facility], FACP  Diplomate, Biomedical engineer of Sleep Medicine  Electronically Signed     CDY/MEDQ  D:  12/27/2007 08:51:27  T:   12/27/2007 16:10:96  Job:  [id number]

## 2010-10-03 NOTE — Op Note (Signed)
NAME:  [name] [name], MATH NO.:  [id number]   MEDICAL RECORD NO.:  [id number]          PATIENT TYPE:  AMB   LOCATION:  DAY                          FACILITY:  [facility]   PHYSICIAN:  Valetta Fuller, M.D.  DATE OF BIRTH:  [birth year]   DATE OF PROCEDURE:  DATE OF DISCHARGE:                               OPERATIVE REPORT   PREOPERATIVE DIAGNOSES:  1. Gross hematuria.  2. Bladder tumor.   POSTOPERATIVE DIAGNOSES:  1. Gross hematuria.  2. Bladder tumor.   PROCEDURE PERFORMED:  TURBT with instillation of mitomycin.   SURGEON:  Valetta Fuller, M.D.   ANESTHESIA:  General with LMA.   INDICATIONS:  [name] is a 76 year old male with no real urologic  history.  He presented for further assessment of a 3 week history of  asymptomatic gross hematuria.  The patient had cystoscopy in the office  which revealed what appeared to be a solitary bladder tumor.  This tumor  was involving the right posterior wall of the bladder.  The tumor  appeared to be approximately 3 cm in size.  It appeared to be papillary  and well-differentiated.  We recommended transurethral resection of the  tumor with consideration for mitomycin instillation.  The patient  appeared to understand the rationale for this and risks and benefits and  full informed consent was obtained.   TECHNIQUE AND FINDINGS:  The patient was brought to the operating room.  He received perioperative antibiotic with ciprofloxacin.  He had  successful induction of general LMA anesthesia and was placed in the  lithotomy position and prepped and draped in the usual manner.  Appropriate surgical time-out was performed.  A 28-French continuous  flow resectoscope was utilized.  Careful inspection of the bladder  revealed only a solitary tumor of approximately 3 cm with an overlying  clot in the right posterior wall of the bladder well away from the  ureteral orifice.  This was resected.  We then used a cold cup biopsy to  take a deeper bite of the underlying muscle.  There did not appear to be  evidence of muscle invasion based on the endoscopic findings.  The area  was carefully  fulgurated.  There was no evidence of bladder perforation and hemostasis  was excellent.  We placed a 20-French Foley catheter and elected to  instill 30 mg of mitomycin which will be left in-dwelling for  approximately 45 minutes.  The patient was brought to the recovery room  in stable condition.      Valetta Fuller, M.D.  Electronically Signed     DSG/MEDQ  D:  10/20/2008  T:  10/20/2008  Job:  [id number]   cc:   Oley Balm. Georgina Pillion, M.D.  Fax: [phone]

## 2010-10-05 ENCOUNTER — Encounter (HOSPITAL_COMMUNITY): Payer: Medicare Other

## 2010-10-06 NOTE — Assessment & Plan Note (Signed)
[facility]                             PULMONARY OFFICE NOTE   NAME:[name] [name], [name] [name]                  MRN:          [id number]  DATE:08/26/2006                            DOB:          [birth year]    PROBLEM LIST:  1. Right middle lobe nodule.  2. Bronchiectasis/fibrosis.  3. Left hilar adenopathy.   HISTORY:  He and his wife return for followup. He is coughing somewhat  less. Does not recognize active reflux. Taking 1 or 2 Nexium per week.  They indicate that he has been somewhat depressed and anxious about his  pulmonary status and his wife feels he really needs to get out playing  golf again to get his mind off of it. Nothing new. No chest pain,  adenopathy, fever or purulent sputum.   MEDICATIONS:  1. Atenolol 25 mg.  2. Lipitor 80 mg.  3. Micardis 80/25.   No medication allergy.   OBJECTIVE:  Weight 183 pounds, blood pressure 142/74, pulse regular 60,  room air saturation 98%.  There are fine crackles in the posterior bases. Work of breathing has  not increased.  I do not find adenopathy, neck vein distention or peripheral edema.  Heart sounds are regular without murmur.   Chest CT on July 25, 2006 had shown a right middle lobe density and  prominent markings. PET scan on August 12, 2006 at [facility] showed a  pulmonary nodule in the right lung measuring less than 1 cm with no  abnormal uptake. The left hilum had an enlarged lymph node which had  mild uptake on the PET scan, nonspecific. Chronic interstitial disease  was seen in the bases with the radiologist suggesting that the lymph  node uptake at the left hilum might be reactive with malignancy less  likely. A followup CT was recommended in 3-6 months. I talked with Mr.  and [name] [name] about these findings explaining that the nodular  density was of relatively little concern at this point but needed to be  followed. The bronchiectasis/fibrosis in the lung bases needs to  be  followed up for activity in case it turns out to be a progressive  pulmonary fibrosis. I discussed the possibility of biopsy with wedge  resection of the nodule and told him I felt that a surgeon at this point  would likely bring him back in 3 months to repeat the CT scan rather  than operating now. We talked through several scenarios and agreed to  follow his picture.   PLAN:  Schedule return in 3 months. I will anticipate scheduling a high-  resolution CT scan of the lung at that time.     Clinton D. Maple Hudson, MD, Tonny Bollman, FACP  Electronically Signed    CDY/MedQ  DD: 08/26/2006  DT: 08/27/2006  Job #: [id number]   cc:   Oley Balm. Georgina Pillion, M.D.

## 2010-10-06 NOTE — Assessment & Plan Note (Signed)
[facility]                             PULMONARY OFFICE NOTE   NAME:[name], [name] SALMONS                  MRN:          [id number]  DATE:08/02/2006                            DOB:          [birth year]    PROBLEM:  76 year old man seen in pulmonary consultation at the kind  request of Dr. Georgina Pillion with concern of lung nodules.   HISTORY:  For about three months, he has had some cough which has not  been severe and is usually non-productive. He never smoked. He had a  chest x-ray which led to a CAT scan at [facility] on  07/25/2006 showing a small right middle lobe nodular density with follow  up recommended in two months by the radiologist. Chronic interstitial  changes and suggestion of bronchiectasis noted especially in the  posterior bases with borderline cardiomegaly, normal adrenal glands, and  no bony abnormalities. He denies a history of pneumonia and has not  traveled to parts of the country associated with nodular pulmonary  fungal infections. There was no exposure history to tuberculosis.   MEDICATIONS:  1. Atenolol 25 mg.  2. Lipitor 80 mg.  3. Micardis 80/25.   ALLERGIES:  No medication allergy.   REVIEW OF SYSTEMS:  Non-productive cough without dyspnea; acid  indigestion. Weight has been stable. Cough especially at night. No chest  pain, fever, sweats, blood, adenopathy, or rash. He denies ankle edema  or leg pain and he denies any change in digestion, bowel, or bladder. He  has had some reflux events occasionally, but those seem associated with  food. There is no history of sinus disease.   PAST HISTORY:  Hypertension, elevated cholesterol. No surgeries. No  other significant medical history. He denies intolerance to latex,  contrast dye, or aspirin.   SOCIAL HISTORY:  Never smoked, occasional alcohol. 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   FAMILY HISTORY:  Mother was said to have had chronic obstructive  pulmonary disease, but not to have been a smoker. Others with heart  disease.   OBJECTIVE:  VITAL SIGNS:  Weight 185 pounds, blood pressure 130/68,  pulse regular 59, room air saturation 99%.  GENERAL:  He looks comfortable. I find no adenopathy or rash.  HEENT:  Normal palette spacing with no pharyngeal erythema or post-nasal  drainage. His nose and throat are clear.  CHEST:  Quiet and clear, except that I question trace crackle in the  deep posterior bases. He did not cough while here.  HEART:  Regular rhythm without murmur or gallop.  ABDOMEN:  No hepatosplenomegaly.  EXTREMITIES:  No edema or cyanosis.   RADIOLOGY:  CT from 07/25/2006 described a small right middle lobe nodular  density, chronic interstitial changes with question of bronchiectasis,  and borderline cardiomegaly. There was some calcium in the left anterior  descending coronary artery. These findings were reviewed with him.   IMPRESSION:  Right middle lobe nodule and mild bronchiectasis suggesting  previous inflammation that he does recall or possibly an aspiration  event. Note that the chest  x-ray from February 26 had question of  possible nodules in the left suprahilar and paraspinous areas. The CAT  scan only recognized a nodule in the right middle lobe. We reviewed  options with him. He has no recognized risk factors, but is very  concerned about the possibility of cancer. It appeared prudent that we  go ahead with a PET scan since this would drive a decision towards  referral for surgical evaluation. I believe this nodule is too small for  needle biopsy. We are scheduling pulmonary function tests and a PET  scan. He will return in about three weeks or so after those have been  done and we will make the decision going forward. He is comfortable with  this approach.     Clinton D. Maple Hudson, MD, Tonny Bollman, FACP  Electronically Signed     CDY/MedQ  DD: 08/03/2006  DT: 08/05/2006  Job #: [id number]   cc:   Oley Balm. Georgina Pillion, M.D.

## 2010-10-06 NOTE — Procedures (Signed)
[facility]  Patient:    [name] [name], [name] [name]                    MRN: [id number] Proc. Date: 03/28/00 Attending:  Fayrene Fearing L. Randa Evens, M.D. CC:         Oley Balm. Georgina Pillion, M.D.   Procedure Report  PROCEDURE:  Colonoscopy.  SURGEON:  James L. Edwards, M.D.  MEDICATIONS:  Fentanyl 50 mcg and Versed 4 mg IV.  SCOPE:  Olympus adult video colonoscope.  INDICATIONS:  Heme positive stool, _______ had previous sigmoidoscopies and colonoscopies were negative over the past four or five years.  DESCRIPTION OF PROCEDURE:  The procedure had been explained to the patient and consent obtained.  With the patient in the left lateral decubitus position, the Olympus adult video colonoscope was inserted and advanced under direct visualization.  The prep was excellent.  We were able to advance very easily to the cecum.  The right lower quadrant was transilluminated and the ileocecal valve was seen.  The scope was withdrawn.  The cecum, ascending colon, hepatic flexure, transverse colon, splenic flexure, descending, and sigmoid colon were seen well upon removal.  No polyps or other lesions were seen.  There was no significant diverticular disease.  The scope was withdrawn.  The patient tolerated the procedure well.  Was maintained on low flow oxygen and pulse oximeter throughout the procedure with no obvious problem.  ASSESSMENT:  Heme positive stool with no findings other than internal hemorrhoids on this exam.  PLAN:  Will treat for hemorrhoids and see back in the office in six weeks. DD:  03/28/00 TD:  03/28/00 Job: [id number]

## 2010-10-10 ENCOUNTER — Encounter (HOSPITAL_COMMUNITY): Payer: Medicare Other

## 2010-10-12 ENCOUNTER — Encounter (HOSPITAL_COMMUNITY): Payer: Medicare Other

## 2010-10-17 ENCOUNTER — Encounter (HOSPITAL_COMMUNITY): Payer: Medicare Other

## 2010-10-19 ENCOUNTER — Encounter (HOSPITAL_COMMUNITY): Payer: Medicare Other

## 2010-10-24 ENCOUNTER — Encounter (HOSPITAL_COMMUNITY): Payer: Medicare Other | Attending: Internal Medicine

## 2010-10-24 DIAGNOSIS — R05 Cough: Secondary | ICD-10-CM | POA: Insufficient documentation

## 2010-10-24 DIAGNOSIS — J8410 Pulmonary fibrosis, unspecified: Secondary | ICD-10-CM | POA: Insufficient documentation

## 2010-10-24 DIAGNOSIS — R0609 Other forms of dyspnea: Secondary | ICD-10-CM | POA: Insufficient documentation

## 2010-10-24 DIAGNOSIS — J90 Pleural effusion, not elsewhere classified: Secondary | ICD-10-CM | POA: Insufficient documentation

## 2010-10-24 DIAGNOSIS — R059 Cough, unspecified: Secondary | ICD-10-CM | POA: Insufficient documentation

## 2010-10-24 DIAGNOSIS — I10 Essential (primary) hypertension: Secondary | ICD-10-CM | POA: Insufficient documentation

## 2010-10-24 DIAGNOSIS — Z5189 Encounter for other specified aftercare: Secondary | ICD-10-CM | POA: Insufficient documentation

## 2010-10-24 DIAGNOSIS — K219 Gastro-esophageal reflux disease without esophagitis: Secondary | ICD-10-CM | POA: Insufficient documentation

## 2010-10-24 DIAGNOSIS — E785 Hyperlipidemia, unspecified: Secondary | ICD-10-CM | POA: Insufficient documentation

## 2010-10-24 DIAGNOSIS — R0989 Other specified symptoms and signs involving the circulatory and respiratory systems: Secondary | ICD-10-CM | POA: Insufficient documentation

## 2010-10-26 ENCOUNTER — Encounter (HOSPITAL_COMMUNITY): Payer: Medicare Other

## 2010-10-27 ENCOUNTER — Encounter: Payer: Self-pay | Admitting: Internal Medicine

## 2010-10-30 ENCOUNTER — Encounter: Payer: Self-pay | Admitting: Internal Medicine

## 2010-10-30 ENCOUNTER — Ambulatory Visit (INDEPENDENT_AMBULATORY_CARE_PROVIDER_SITE_OTHER): Payer: Medicare Other | Admitting: Internal Medicine

## 2010-10-30 VITALS — BP 116/64 | HR 65 | Ht 68.0 in | Wt 185.8 lb

## 2010-10-30 DIAGNOSIS — J984 Other disorders of lung: Secondary | ICD-10-CM

## 2010-10-30 DIAGNOSIS — J8410 Pulmonary fibrosis, unspecified: Secondary | ICD-10-CM

## 2010-10-30 MED ORDER — TRAMADOL HCL 50 MG PO TABS: 50.0000 mg | ORAL_TABLET | Freq: Four times a day (QID) | ORAL | Status: DC | PRN

## 2010-10-30 MED ORDER — PROMETHAZINE-CODEINE 6.25-10 MG/5ML PO SYRP: ORAL_SOLUTION | ORAL | Status: DC

## 2010-10-30 NOTE — Progress Notes (Signed)
  Subjective:    Patient ID: [name] [name], male    DOB: 03-06-1935, 76 y.o.   MRN: [id number]  HPI 10/30/10- 62 yoM never smoker, followed for pulmonary fibrosis and RML nodule, complicated by bronchiectasis, GERD and HBP. Last here June 08, 2010- note reviewed.  He continues Pulmonary Rehab. He finished clinical trial at [facility] and has now started perfenadone/ Asend trial at [facility]. With these he is getting PFTs, and xrays at [facility].  He notes that dyspnea is stable- doe on stairs, able to play golf twice weekly. Dry cough is gradually worse. Codeine cough syrup and benzonatate help.    Review of Systems Constitutional:   No weight loss, night sweats,  Fevers, chills, fatigue, lassitude. HEENT:   No headaches,  Difficulty swallowing,  Tooth/dental problems,  Sore throat,                No sneezing, itching, ear ache, nasal congestion, post nasal drip,   CV:  No chest pain,  Orthopnea, PND, swelling in lower extremities, anasarca, dizziness, palpitations  GI  No heartburn, indigestion, abdominal pain, nausea, vomiting, diarrhea, change in bowel habits, loss of appetite  Resp:   No coughing up of blood.  No change in color of mucus.  No wheezing.   Skin: no rash or lesions.  GU: no dysuria, change in color of urine, no urgency or frequency.  No flank pain.  MS:  No joint pain or swelling.  No decreased range of motion.  No back pain.  Psych:  No change in mood or affect. No depression or anxiety.  No memory loss.      Objective:   Physical Exam General- Alert, Oriented, Affect-appropriate, Distress- none acute  Skin- rash-none, lesions- none, excoriation- none  Lymphadenopathy- none  Head- atraumatic  Eyes- Gross vision intact, PERRLA,                             conjunctival hemorrhage on right  Ears- Hearing, canals, Tm - normal  Nose- Clear, No-Septal dev, mucus, polyps, erosion, perforation   Throat- Mallampati II , mucosa clear , drainage- none, tonsils-  atrophic  Neck- flexible , trachea midline, no stridor , thyroid nl, carotid no bruit  Chest - symmetrical excursion , unlabored     Heart/CV- RRR , no murmur , no gallop  , no rub, nl s1 s2                     - JVD- none , edema- none, stasis changes- none, varices- none     Lung- crackles to upper back bilaterally,             wheeze- none, cough- dry , dullness-none, rub- none     Chest wall-   Abd- tender-no, distended-no, bowel sounds-present, HSM- no  Br/ Gen/ Rectal- Not done, not indicated  Extrem- cyanosis- none, clubbing, none, atrophy- none, strength- nl  Neuro- grossly intact to observation         Assessment & Plan:

## 2010-10-30 NOTE — Assessment & Plan Note (Signed)
We talked about acceptable day-time cough suppressants and will try tramadol. He will call his study coordinator for an ok.

## 2010-10-30 NOTE — Assessment & Plan Note (Signed)
He is being imaged at [facility]. There is not a reason or benefit to him for me to repeat those imaging studies here.

## 2010-10-30 NOTE — Patient Instructions (Signed)
Script for cough syrup refilled  Script to try tramadol/ Ultram for cough. Call for larger amount if helpful.

## 2010-10-31 ENCOUNTER — Encounter (HOSPITAL_COMMUNITY): Payer: Medicare Other

## 2010-11-02 ENCOUNTER — Encounter (HOSPITAL_COMMUNITY): Payer: Medicare Other

## 2010-11-07 ENCOUNTER — Encounter (HOSPITAL_COMMUNITY): Payer: Medicare Other

## 2010-11-07 ENCOUNTER — Telehealth: Payer: Self-pay | Admitting: Internal Medicine

## 2010-11-07 MED ORDER — TRAMADOL HCL 50 MG PO TABS: 50.0000 mg | ORAL_TABLET | Freq: Four times a day (QID) | ORAL | Status: DC | PRN

## 2010-11-07 NOTE — Telephone Encounter (Signed)
Per CY-okay to refill this time for patient. Rx sent.    Pt aware that Rx sent.

## 2010-11-09 ENCOUNTER — Encounter (HOSPITAL_COMMUNITY): Payer: Medicare Other

## 2010-11-14 ENCOUNTER — Encounter (HOSPITAL_COMMUNITY): Payer: Medicare Other

## 2010-11-16 ENCOUNTER — Encounter (HOSPITAL_COMMUNITY): Payer: Medicare Other

## 2010-11-21 ENCOUNTER — Encounter (HOSPITAL_COMMUNITY): Payer: Medicare Other | Attending: Internal Medicine

## 2010-11-21 DIAGNOSIS — R05 Cough: Secondary | ICD-10-CM | POA: Insufficient documentation

## 2010-11-21 DIAGNOSIS — J90 Pleural effusion, not elsewhere classified: Secondary | ICD-10-CM | POA: Insufficient documentation

## 2010-11-21 DIAGNOSIS — Z5189 Encounter for other specified aftercare: Secondary | ICD-10-CM | POA: Insufficient documentation

## 2010-11-21 DIAGNOSIS — E785 Hyperlipidemia, unspecified: Secondary | ICD-10-CM | POA: Insufficient documentation

## 2010-11-21 DIAGNOSIS — R0989 Other specified symptoms and signs involving the circulatory and respiratory systems: Secondary | ICD-10-CM | POA: Insufficient documentation

## 2010-11-21 DIAGNOSIS — I10 Essential (primary) hypertension: Secondary | ICD-10-CM | POA: Insufficient documentation

## 2010-11-21 DIAGNOSIS — J8410 Pulmonary fibrosis, unspecified: Secondary | ICD-10-CM | POA: Insufficient documentation

## 2010-11-21 DIAGNOSIS — R0609 Other forms of dyspnea: Secondary | ICD-10-CM | POA: Insufficient documentation

## 2010-11-21 DIAGNOSIS — R059 Cough, unspecified: Secondary | ICD-10-CM | POA: Insufficient documentation

## 2010-11-21 DIAGNOSIS — K219 Gastro-esophageal reflux disease without esophagitis: Secondary | ICD-10-CM | POA: Insufficient documentation

## 2010-11-23 ENCOUNTER — Encounter (HOSPITAL_COMMUNITY): Payer: Medicare Other

## 2010-11-28 ENCOUNTER — Encounter (HOSPITAL_COMMUNITY): Payer: Medicare Other

## 2010-11-30 ENCOUNTER — Encounter (HOSPITAL_COMMUNITY): Payer: Medicare Other

## 2010-12-05 ENCOUNTER — Encounter (HOSPITAL_COMMUNITY): Payer: Medicare Other

## 2010-12-07 ENCOUNTER — Encounter (HOSPITAL_COMMUNITY): Payer: Medicare Other

## 2010-12-12 ENCOUNTER — Encounter (HOSPITAL_COMMUNITY): Payer: Medicare Other

## 2010-12-14 ENCOUNTER — Encounter (HOSPITAL_COMMUNITY): Payer: Medicare Other

## 2010-12-19 ENCOUNTER — Encounter (HOSPITAL_COMMUNITY): Payer: Medicare Other

## 2010-12-21 ENCOUNTER — Encounter (HOSPITAL_COMMUNITY): Payer: Medicare Other | Attending: Internal Medicine

## 2010-12-21 DIAGNOSIS — R059 Cough, unspecified: Secondary | ICD-10-CM | POA: Insufficient documentation

## 2010-12-21 DIAGNOSIS — E785 Hyperlipidemia, unspecified: Secondary | ICD-10-CM | POA: Insufficient documentation

## 2010-12-21 DIAGNOSIS — R0989 Other specified symptoms and signs involving the circulatory and respiratory systems: Secondary | ICD-10-CM | POA: Insufficient documentation

## 2010-12-21 DIAGNOSIS — J8410 Pulmonary fibrosis, unspecified: Secondary | ICD-10-CM | POA: Insufficient documentation

## 2010-12-21 DIAGNOSIS — J90 Pleural effusion, not elsewhere classified: Secondary | ICD-10-CM | POA: Insufficient documentation

## 2010-12-21 DIAGNOSIS — R0609 Other forms of dyspnea: Secondary | ICD-10-CM | POA: Insufficient documentation

## 2010-12-21 DIAGNOSIS — R05 Cough: Secondary | ICD-10-CM | POA: Insufficient documentation

## 2010-12-21 DIAGNOSIS — I10 Essential (primary) hypertension: Secondary | ICD-10-CM | POA: Insufficient documentation

## 2010-12-21 DIAGNOSIS — K219 Gastro-esophageal reflux disease without esophagitis: Secondary | ICD-10-CM | POA: Insufficient documentation

## 2010-12-21 DIAGNOSIS — Z5189 Encounter for other specified aftercare: Secondary | ICD-10-CM | POA: Insufficient documentation

## 2010-12-26 ENCOUNTER — Encounter (HOSPITAL_COMMUNITY): Payer: Medicare Other

## 2010-12-28 ENCOUNTER — Encounter (HOSPITAL_COMMUNITY): Payer: Medicare Other

## 2011-01-02 ENCOUNTER — Encounter (HOSPITAL_COMMUNITY): Payer: Medicare Other

## 2011-01-04 ENCOUNTER — Encounter (HOSPITAL_COMMUNITY): Payer: Medicare Other

## 2011-01-09 ENCOUNTER — Encounter (HOSPITAL_COMMUNITY): Payer: Medicare Other

## 2011-01-11 ENCOUNTER — Encounter (HOSPITAL_COMMUNITY): Payer: Medicare Other

## 2011-01-15 ENCOUNTER — Telehealth: Payer: Self-pay | Admitting: Internal Medicine

## 2011-01-15 NOTE — Telephone Encounter (Signed)
Spoke with [employer]. She states that the pt is in a clinical study at [facility]. She states that he this requires him to have lab work done every 2 months. Pt wants to have this done here, as it is more convenient for him. She states that pt would come here to our lab with a kit including everything that they would need. CDY, pls advise thanks

## 2011-01-16 ENCOUNTER — Encounter (HOSPITAL_COMMUNITY): Payer: Medicare Other

## 2011-01-16 NOTE — Telephone Encounter (Signed)
Left Message for [name] [name] to call me back about this patient-we will not do lab draws for this patient as they are requesting.

## 2011-01-17 NOTE — Telephone Encounter (Signed)
[name] [name] for m thoma

## 2011-01-18 ENCOUNTER — Encounter (HOSPITAL_COMMUNITY): Payer: Medicare Other

## 2011-01-18 NOTE — Telephone Encounter (Signed)
LMOM for [name] to return call to triage.

## 2011-01-18 NOTE — Telephone Encounter (Signed)
Spoke with [employer] and advised of recs per [employer]. She states needs specific reason why labs can not be done here. Will forward to [facility] per TP.

## 2011-01-18 NOTE — Telephone Encounter (Signed)
Spoke with [name] [name] is aware that per protocol we will not draw labs and send out for them. Pt is in a study program at [facility] and wanted another office to draw labs and send to their group for processing.

## 2011-01-23 ENCOUNTER — Encounter (HOSPITAL_COMMUNITY): Payer: Medicare Other | Attending: Internal Medicine

## 2011-01-23 DIAGNOSIS — E785 Hyperlipidemia, unspecified: Secondary | ICD-10-CM | POA: Insufficient documentation

## 2011-01-23 DIAGNOSIS — R0609 Other forms of dyspnea: Secondary | ICD-10-CM | POA: Insufficient documentation

## 2011-01-23 DIAGNOSIS — R0989 Other specified symptoms and signs involving the circulatory and respiratory systems: Secondary | ICD-10-CM | POA: Insufficient documentation

## 2011-01-23 DIAGNOSIS — K219 Gastro-esophageal reflux disease without esophagitis: Secondary | ICD-10-CM | POA: Insufficient documentation

## 2011-01-23 DIAGNOSIS — R059 Cough, unspecified: Secondary | ICD-10-CM | POA: Insufficient documentation

## 2011-01-23 DIAGNOSIS — R05 Cough: Secondary | ICD-10-CM | POA: Insufficient documentation

## 2011-01-23 DIAGNOSIS — J90 Pleural effusion, not elsewhere classified: Secondary | ICD-10-CM | POA: Insufficient documentation

## 2011-01-23 DIAGNOSIS — I10 Essential (primary) hypertension: Secondary | ICD-10-CM | POA: Insufficient documentation

## 2011-01-23 DIAGNOSIS — J8410 Pulmonary fibrosis, unspecified: Secondary | ICD-10-CM | POA: Insufficient documentation

## 2011-01-23 DIAGNOSIS — Z5189 Encounter for other specified aftercare: Secondary | ICD-10-CM | POA: Insufficient documentation

## 2011-01-25 ENCOUNTER — Telehealth: Payer: Self-pay | Admitting: Cardiology

## 2011-01-25 ENCOUNTER — Encounter (HOSPITAL_COMMUNITY): Payer: Medicare Other

## 2011-01-25 NOTE — Telephone Encounter (Signed)
Discussed with Lawson Fiscal. Will continue to monitor at rehab and at home.  Spoke wit Byrd Hesselbach and patient.  Both are to notify if heart rate stays in 40's.  Patient states he feels fine, no symptoms.  Chart on dest for review.  Did schedule appointment for this month to be seen.

## 2011-01-25 NOTE — Telephone Encounter (Signed)
Pt at cardiac rehab now. C/O heart rate 46, asympathic, sinus bradycardia. Will fax notes over. Maria aware message sent as urgent message

## 2011-01-27 NOTE — Telephone Encounter (Signed)
Agree with plan 

## 2011-01-30 ENCOUNTER — Encounter (HOSPITAL_COMMUNITY): Payer: Medicare Other

## 2011-01-31 ENCOUNTER — Encounter: Payer: Self-pay | Admitting: Cardiology

## 2011-02-01 ENCOUNTER — Encounter (HOSPITAL_COMMUNITY): Payer: Medicare Other

## 2011-02-06 ENCOUNTER — Encounter (HOSPITAL_COMMUNITY): Payer: Medicare Other

## 2011-02-08 ENCOUNTER — Other Ambulatory Visit: Payer: Self-pay | Admitting: *Deleted

## 2011-02-08 ENCOUNTER — Encounter (HOSPITAL_COMMUNITY): Payer: Medicare Other

## 2011-02-08 ENCOUNTER — Ambulatory Visit (INDEPENDENT_AMBULATORY_CARE_PROVIDER_SITE_OTHER): Payer: Medicare Other | Admitting: Cardiology

## 2011-02-08 ENCOUNTER — Encounter: Payer: Self-pay | Admitting: Cardiology

## 2011-02-08 VITALS — BP 130/80 | HR 60 | Wt 186.0 lb

## 2011-02-08 DIAGNOSIS — E7800 Pure hypercholesterolemia, unspecified: Secondary | ICD-10-CM

## 2011-02-08 DIAGNOSIS — E785 Hyperlipidemia, unspecified: Secondary | ICD-10-CM

## 2011-02-08 DIAGNOSIS — I119 Hypertensive heart disease without heart failure: Secondary | ICD-10-CM

## 2011-02-08 DIAGNOSIS — J8410 Pulmonary fibrosis, unspecified: Secondary | ICD-10-CM

## 2011-02-08 DIAGNOSIS — I10 Essential (primary) hypertension: Secondary | ICD-10-CM

## 2011-02-08 MED ORDER — ATENOLOL 25 MG PO TABS: 25.0000 mg | ORAL_TABLET | Freq: Every day | ORAL | Status: DC

## 2011-02-08 NOTE — Telephone Encounter (Signed)
Sent to pharmacy 

## 2011-02-08 NOTE — Assessment & Plan Note (Signed)
The patient has a history of pulmonary fibrosis.  He is not very limited in terms of his activity.  He plays golf once a week and goes to pulmonary rehabilitation twice a week.  He continues in the drug study down at [facility].

## 2011-02-08 NOTE — Progress Notes (Signed)
[name] [name] Date of Birth:  Apr 26, 1935 [facility] [address]   [address] [place], [place]  [address] [phone]           Fax   [phone]  History of Present Illness: This pleasant 76 year old gentleman is seen after a long absence.  We last saw him in March of 2011.  He has a history of pulmonary fibrosis which is idiopathic.  He is enrolled in a clinical trial at [facility].  He was initially referred to [place] because of marked sinus bradycardia.  He also has a history of essential hypertension.  He does not have any history of ischemic heart disease.  On 07/29/09 he had a treadmill Cardiolite stress test which shows normal perfusion and no evidence of ischemia and his ejection fraction was 64%.  Patient has a history of a echocardiogram on 07/25/09 showing normal left ventricular systolic function with impaired relaxation and showing mild aortic sclerosis without stenosis and normal pulmonary artery pressure.  Current Outpatient Prescriptions  Medication Sig Dispense Refill  . atorvastatin (LIPITOR) 80 MG tablet Take 80 mg by mouth daily.        [name] omeprazole (PRILOSEC) 20 MG capsule Take 20 mg by mouth daily. 30 minutes before breakfast       . telmisartan (MICARDIS) 80 MG tablet Take 80 mg by mouth daily.        [name] tiotropium (SPIRIVA) 18 MCG inhalation capsule Place 18 mcg into inhaler and inhale daily.        [name] atenolol (TENORMIN) 25 MG tablet Take 1 tablet (25 mg total) by mouth daily.  90 tablet  3    No Known Allergies  Patient Active Problem List  Diagnoses  . HYPERLIPIDEMIA  . HYPERTENSION  . BRONCHIECTASIS  . PULMONARY FIBROSIS  . PULMONARY NODULE, RIGHT MIDDLE LOBE  . G E R D  . DYSPNEA  . BRONCHITIS, ACUTE    History  Smoking status  . Never Smoker   Smokeless tobacco  . Not on file    History  Alcohol Use: Not on file    Family History  Problem Relation Age of Onset  . Coronary artery disease    . COPD    . Pulmonary  fibrosis Brother   . COPD Mother   . Heart failure Father     Review of Systems: Constitutional: no fever chills diaphoresis or fatigue or change in weight.  Head and neck: no hearing loss, no epistaxis, no photophobia or visual disturbance. Respiratory: No cough, shortness of breath or wheezing. Cardiovascular: No chest pain peripheral edema, palpitations. Gastrointestinal: No abdominal distention, no abdominal pain, no change in bowel habits hematochezia or melena. Genitourinary: No dysuria, no frequency, no urgency, no nocturia. Musculoskeletal:No arthralgias, no back pain, no gait disturbance or myalgias. Neurological: No dizziness, no headaches, no numbness, no seizures, no syncope, no weakness, no tremors. Hematologic: No lymphadenopathy, no easy bruising. Psychiatric: No confusion, no hallucinations, no sleep disturbance.    Physical Exam: Filed Vitals:   02/08/11 1456  BP: 130/80  Pulse: 60  The general appearance reveals a well-developed well-nourished gentleman in no distress.Pupils equal and reactive.   Extraocular Movements are full.  There is no scleral icterus.  The mouth and pharynx are normal.  The neck is supple.  The carotids reveal no bruits.  The jugular venous pressure is normal.  The thyroid is not enlarged.  There is no lymphadenopathy.    The chest reveals fine inspiratory pulmonary  rales bilaterally.  There is no expiratory wheeze.The precordium is quiet.  The first heart sound is normal.  The second heart sound is physiologically split.  There is no murmur gallop rub or click.  There is no abnormal lift or heave.  The abdomen is soft and nontender. Bowel sounds are normal. The liver and spleen are not enlarged. There Are no abdominal masses. There are no bruits.  The pedal pulses are good.  There is no phlebitis or edema.  There is no cyanosis or clubbing.  Strength is normal and symmetrical in all extremities.  There is no lateralizing weakness.  There are  no sensory deficits.  The skin is warm and dry.  There is no rash.     Assessment / Plan:  Continue same medication.  Recheck in one year or sooner p.r.n.

## 2011-02-08 NOTE — Assessment & Plan Note (Signed)
The patient has a history of essential hypertension.  He is on Atenolol and Micardis.  He is not having any side effects except for asymptomatic sinus bradycardia.  He has not had any dizziness or syncope.

## 2011-02-08 NOTE — Assessment & Plan Note (Signed)
The patient has a history of hypercholesterolemia.He is on Lipitor 80 mg daily.  He's not having any side effects from the Lipitor.

## 2011-02-08 NOTE — Telephone Encounter (Signed)
Pt returning call to [place]. Pt said Darl Pikes called wanting to know pt pharmacy. Pt said it is [facility] [id number] [place] [address] [phone]

## 2011-02-08 NOTE — Telephone Encounter (Signed)
Pt returning call to [place]. Please call back.

## 2011-02-13 ENCOUNTER — Encounter (HOSPITAL_COMMUNITY): Payer: Medicare Other

## 2011-02-15 ENCOUNTER — Encounter (HOSPITAL_COMMUNITY): Payer: Medicare Other

## 2011-02-20 ENCOUNTER — Encounter (HOSPITAL_COMMUNITY): Payer: Medicare Other | Attending: Internal Medicine

## 2011-02-20 DIAGNOSIS — R05 Cough: Secondary | ICD-10-CM | POA: Insufficient documentation

## 2011-02-20 DIAGNOSIS — Z5189 Encounter for other specified aftercare: Secondary | ICD-10-CM | POA: Insufficient documentation

## 2011-02-20 DIAGNOSIS — R0989 Other specified symptoms and signs involving the circulatory and respiratory systems: Secondary | ICD-10-CM | POA: Insufficient documentation

## 2011-02-20 DIAGNOSIS — R0609 Other forms of dyspnea: Secondary | ICD-10-CM | POA: Insufficient documentation

## 2011-02-20 DIAGNOSIS — J8410 Pulmonary fibrosis, unspecified: Secondary | ICD-10-CM | POA: Insufficient documentation

## 2011-02-20 DIAGNOSIS — I10 Essential (primary) hypertension: Secondary | ICD-10-CM | POA: Insufficient documentation

## 2011-02-20 DIAGNOSIS — J90 Pleural effusion, not elsewhere classified: Secondary | ICD-10-CM | POA: Insufficient documentation

## 2011-02-20 DIAGNOSIS — R059 Cough, unspecified: Secondary | ICD-10-CM | POA: Insufficient documentation

## 2011-02-20 DIAGNOSIS — E785 Hyperlipidemia, unspecified: Secondary | ICD-10-CM | POA: Insufficient documentation

## 2011-02-20 DIAGNOSIS — K219 Gastro-esophageal reflux disease without esophagitis: Secondary | ICD-10-CM | POA: Insufficient documentation

## 2011-02-22 ENCOUNTER — Encounter (HOSPITAL_COMMUNITY): Payer: Medicare Other

## 2011-02-27 ENCOUNTER — Encounter (HOSPITAL_COMMUNITY): Payer: Medicare Other

## 2011-03-01 ENCOUNTER — Encounter (HOSPITAL_COMMUNITY): Payer: Medicare Other

## 2011-03-06 ENCOUNTER — Encounter (HOSPITAL_COMMUNITY): Payer: Medicare Other

## 2011-03-08 ENCOUNTER — Encounter (HOSPITAL_COMMUNITY): Payer: Medicare Other

## 2011-03-13 ENCOUNTER — Encounter (HOSPITAL_COMMUNITY): Payer: Medicare Other

## 2011-03-15 ENCOUNTER — Encounter (HOSPITAL_COMMUNITY): Payer: Medicare Other

## 2011-03-20 ENCOUNTER — Encounter (HOSPITAL_COMMUNITY): Payer: Medicare Other

## 2011-03-22 ENCOUNTER — Encounter (HOSPITAL_COMMUNITY): Payer: Medicare Other

## 2011-03-22 DIAGNOSIS — R0989 Other specified symptoms and signs involving the circulatory and respiratory systems: Secondary | ICD-10-CM | POA: Insufficient documentation

## 2011-03-22 DIAGNOSIS — I10 Essential (primary) hypertension: Secondary | ICD-10-CM | POA: Insufficient documentation

## 2011-03-22 DIAGNOSIS — R0609 Other forms of dyspnea: Secondary | ICD-10-CM | POA: Insufficient documentation

## 2011-03-22 DIAGNOSIS — E785 Hyperlipidemia, unspecified: Secondary | ICD-10-CM | POA: Insufficient documentation

## 2011-03-22 DIAGNOSIS — J8410 Pulmonary fibrosis, unspecified: Secondary | ICD-10-CM | POA: Insufficient documentation

## 2011-03-22 DIAGNOSIS — R05 Cough: Secondary | ICD-10-CM | POA: Insufficient documentation

## 2011-03-22 DIAGNOSIS — J90 Pleural effusion, not elsewhere classified: Secondary | ICD-10-CM | POA: Insufficient documentation

## 2011-03-22 DIAGNOSIS — Z5189 Encounter for other specified aftercare: Secondary | ICD-10-CM | POA: Insufficient documentation

## 2011-03-22 DIAGNOSIS — K219 Gastro-esophageal reflux disease without esophagitis: Secondary | ICD-10-CM | POA: Insufficient documentation

## 2011-03-22 DIAGNOSIS — R059 Cough, unspecified: Secondary | ICD-10-CM | POA: Insufficient documentation

## 2011-03-23 ENCOUNTER — Telehealth: Payer: Self-pay | Admitting: Internal Medicine

## 2011-03-23 MED ORDER — AZITHROMYCIN 250 MG PO TABS: ORAL_TABLET | ORAL | Status: AC

## 2011-03-23 NOTE — Telephone Encounter (Signed)
Spoke with pt. He is c/o prod cough and chest congestion x several days. Cough is prod with clear sputum. Pt states that he already has plenty of cough syrup that CDY had already prescribed for him before, but wants to have a zpack called in. No change in breathing, wheeze, or fever. Please advise, thanks! No Known Allergies

## 2011-03-23 NOTE — Telephone Encounter (Signed)
Per CY-okay to give Zpak #  Take as directed no refills.

## 2011-03-23 NOTE — Telephone Encounter (Signed)
Pt is aware and [facility] [address]

## 2011-03-27 ENCOUNTER — Encounter (HOSPITAL_COMMUNITY): Payer: Medicare Other

## 2011-03-29 ENCOUNTER — Encounter (HOSPITAL_COMMUNITY): Payer: Medicare Other

## 2011-04-03 ENCOUNTER — Encounter (HOSPITAL_COMMUNITY): Payer: Medicare Other

## 2011-04-05 ENCOUNTER — Encounter (HOSPITAL_COMMUNITY)
Admission: RE | Admit: 2011-04-05 | Discharge: 2011-04-05 | Disposition: A | Discharge status: Home / Self Care | Payer: Medicare Other | Source: Ambulatory Visit | Attending: Internal Medicine | Admitting: Internal Medicine

## 2011-04-10 ENCOUNTER — Encounter (HOSPITAL_COMMUNITY): Payer: Medicare Other

## 2011-04-12 ENCOUNTER — Encounter (HOSPITAL_COMMUNITY): Payer: Medicare Other

## 2011-04-17 ENCOUNTER — Encounter (HOSPITAL_COMMUNITY)
Admission: RE | Admit: 2011-04-17 | Discharge: 2011-04-17 | Disposition: A | Discharge status: Home / Self Care | Payer: Medicare Other | Source: Ambulatory Visit | Attending: Internal Medicine | Admitting: Internal Medicine

## 2011-04-19 ENCOUNTER — Encounter (HOSPITAL_COMMUNITY)
Admission: RE | Admit: 2011-04-19 | Discharge: 2011-04-19 | Disposition: A | Discharge status: Home / Self Care | Payer: Medicare Other | Source: Ambulatory Visit | Attending: Internal Medicine | Admitting: Internal Medicine

## 2011-04-24 ENCOUNTER — Encounter (HOSPITAL_COMMUNITY): Payer: Federal, State, Local not specified - PPO

## 2011-04-24 DIAGNOSIS — Z5189 Encounter for other specified aftercare: Secondary | ICD-10-CM | POA: Insufficient documentation

## 2011-04-24 DIAGNOSIS — R05 Cough: Secondary | ICD-10-CM | POA: Insufficient documentation

## 2011-04-24 DIAGNOSIS — J90 Pleural effusion, not elsewhere classified: Secondary | ICD-10-CM | POA: Insufficient documentation

## 2011-04-24 DIAGNOSIS — K219 Gastro-esophageal reflux disease without esophagitis: Secondary | ICD-10-CM | POA: Insufficient documentation

## 2011-04-24 DIAGNOSIS — R0609 Other forms of dyspnea: Secondary | ICD-10-CM | POA: Insufficient documentation

## 2011-04-24 DIAGNOSIS — R059 Cough, unspecified: Secondary | ICD-10-CM | POA: Insufficient documentation

## 2011-04-24 DIAGNOSIS — J8410 Pulmonary fibrosis, unspecified: Secondary | ICD-10-CM | POA: Insufficient documentation

## 2011-04-24 DIAGNOSIS — I10 Essential (primary) hypertension: Secondary | ICD-10-CM | POA: Insufficient documentation

## 2011-04-24 DIAGNOSIS — E785 Hyperlipidemia, unspecified: Secondary | ICD-10-CM | POA: Insufficient documentation

## 2011-04-24 DIAGNOSIS — R0989 Other specified symptoms and signs involving the circulatory and respiratory systems: Secondary | ICD-10-CM | POA: Insufficient documentation

## 2011-04-26 ENCOUNTER — Encounter (HOSPITAL_COMMUNITY)
Admission: RE | Admit: 2011-04-26 | Discharge: 2011-04-26 | Disposition: A | Discharge status: Home / Self Care | Payer: Federal, State, Local not specified - PPO | Source: Ambulatory Visit | Attending: Internal Medicine | Admitting: Internal Medicine

## 2011-05-01 ENCOUNTER — Ambulatory Visit (INDEPENDENT_AMBULATORY_CARE_PROVIDER_SITE_OTHER): Payer: Medicare Other | Admitting: Internal Medicine

## 2011-05-01 ENCOUNTER — Encounter (HOSPITAL_COMMUNITY)
Admission: RE | Admit: 2011-05-01 | Discharge: 2011-05-01 | Disposition: A | Discharge status: Home / Self Care | Payer: Federal, State, Local not specified - PPO | Source: Ambulatory Visit | Attending: Internal Medicine | Admitting: Internal Medicine

## 2011-05-01 ENCOUNTER — Encounter: Payer: Self-pay | Admitting: Internal Medicine

## 2011-05-01 VITALS — BP 130/82 | HR 59 | Ht 68.0 in | Wt 186.4 lb

## 2011-05-01 DIAGNOSIS — J209 Acute bronchitis, unspecified: Secondary | ICD-10-CM

## 2011-05-01 DIAGNOSIS — J8410 Pulmonary fibrosis, unspecified: Secondary | ICD-10-CM

## 2011-05-01 DIAGNOSIS — J984 Other disorders of lung: Secondary | ICD-10-CM

## 2011-05-01 MED ORDER — PROMETHAZINE-CODEINE 6.25-10 MG/5ML PO SYRP: ORAL_SOLUTION | ORAL | Status: DC

## 2011-05-01 NOTE — Progress Notes (Signed)
Patient ID: [name] [name], male    DOB: 1934-06-19, 76 y.o.   MRN: [id number]  HPI 10/30/10- 76 yoM never smoker, followed for pulmonary fibrosis and RML nodule, complicated by bronchiectasis, GERD and HBP. Last here June 08, 2010- note reviewed.  He continues Pulmonary Rehab. He finished clinical trial at [facility] and has now started perfenadone/ Asend trial at [facility]. With these he is getting PFTs, and xrays at [facility].  He notes that dyspnea is stable- doe on stairs, able to play golf twice weekly. Dry cough is gradually worse. Codeine cough syrup and benzonatate help.   05/01/11- 10/30/10- 76 yoM never smoker, followed for pulmonary fibrosis and RML nodule, complicated by bronchiectasis, GERD and HBP Has had flu vaccine. Still participating in pulmonary rehabilitation twice a week as he continues with a research drug protocol at [facility]. He had a negative test for reflux recently. He describes a protracted "cold" since November 1. He may have a little fever and discolored sputum at the beginning. We had sent a Z-Pak and he is now taking an Alka-Seltzer product. Over the last 6 months he has felt about the same. He notices chronic cough with deep breath but no progressive shortness of breath. He says he looked stable on a 6 minute walk test and PFT at [facility]. Spiriva did not help it is no longer being used.  Review of Systems- see HPI Constitutional:   No-   weight loss, night sweats, fevers, chills, fatigue, lassitude. HEENT:   No-  headaches, difficulty swallowing, tooth/dental problems, sore throat,       No-  sneezing, itching, ear ache, nasal congestion, post nasal drip,  CV:  No-   chest pain, orthopnea, PND, swelling in lower extremities, anasarca,                                  dizziness, palpitations Resp: +  shortness of breath with exertion or at rest.              No-   productive cough,  +non-productive cough,  No- coughing up of blood.              No-   change in color of mucus.   No- wheezing.   Skin: No-   rash or lesions. GI:  No-   heartburn, indigestion, abdominal pain, nausea, vomiting, diarrhea,                 change in bowel habits, loss of appetite GU: No-   dysuria, change in color of urine, no urgency or frequency.  No- flank pain. MS:  No-   joint pain or swelling.  No- decreased range of motion.  No- back pain. Neuro-     nothing unusual Psych:  No- change in mood or affect. No depression or anxiety.  No memory loss.      Objective:   Physical Exam General- Alert, Oriented, Affect-appropriate, Distress- none acute Skin- rash-none, lesions- none, excoriation- none Lymphadenopathy- none Head- atraumatic            Eyes- Gross vision intact, PERRLA, conjunctivae clear secretions            Ears- Hearing, canals-normal            Nose- Clear, no-Septal dev, mucus, polyps, erosion, perforation             Throat- Mallampati II , mucosa clear ,  drainage- none, tonsils- atrophic Neck- flexible , trachea midline, no stridor , thyroid nl, carotid no bruit Chest - symmetrical excursion , unlabored           Heart/CV- RRR , no murmur , no gallop  , no rub, nl s1 s2                           - JVD- none , edema- none, stasis changes- none, varices- none           Lung- diffuse crackles, wheeze- none, raspy cough , dullness-none, rub- none           Chest wall-  Abd- tender-no, distended-no, bowel sounds-present, HSM- no Br/ Gen/ Rectal- Not done, not indicated Extrem- cyanosis- none, clubbing- none, atrophy- none, strength- nl Neuro- grossly intact to observation

## 2011-05-01 NOTE — Patient Instructions (Signed)
Refill script cough syrup  Please call as needed

## 2011-05-03 ENCOUNTER — Encounter (HOSPITAL_COMMUNITY)
Admission: RE | Admit: 2011-05-03 | Discharge: 2011-05-03 | Disposition: A | Discharge status: Home / Self Care | Payer: Federal, State, Local not specified - PPO | Source: Ambulatory Visit | Attending: Internal Medicine | Admitting: Internal Medicine

## 2011-05-05 NOTE — Assessment & Plan Note (Signed)
He is getting regular chest x-ray followup at [facility].

## 2011-05-05 NOTE — Assessment & Plan Note (Signed)
Extensive UIP. He is now in his second double blind drug trial at [facility] and reports stable pulmonary function.

## 2011-05-05 NOTE — Assessment & Plan Note (Signed)
Recent viral syndrome with mild bronchitic exacerbation should resolve. I see no indication for antibiotics.

## 2011-05-08 ENCOUNTER — Encounter (HOSPITAL_COMMUNITY)
Admission: RE | Admit: 2011-05-08 | Discharge: 2011-05-08 | Disposition: A | Discharge status: Home / Self Care | Payer: Federal, State, Local not specified - PPO | Source: Ambulatory Visit | Attending: Internal Medicine | Admitting: Internal Medicine

## 2011-05-10 ENCOUNTER — Encounter (HOSPITAL_COMMUNITY)
Admission: RE | Admit: 2011-05-10 | Discharge: 2011-05-10 | Disposition: A | Discharge status: Home / Self Care | Payer: Federal, State, Local not specified - PPO | Source: Ambulatory Visit | Attending: Internal Medicine | Admitting: Internal Medicine

## 2011-05-15 ENCOUNTER — Encounter (HOSPITAL_COMMUNITY): Payer: Federal, State, Local not specified - PPO

## 2011-05-17 ENCOUNTER — Encounter (HOSPITAL_COMMUNITY): Payer: Federal, State, Local not specified - PPO

## 2011-05-22 ENCOUNTER — Encounter (HOSPITAL_COMMUNITY): Payer: Medicare Other

## 2011-05-22 DIAGNOSIS — R05 Cough: Secondary | ICD-10-CM | POA: Insufficient documentation

## 2011-05-22 DIAGNOSIS — Z5189 Encounter for other specified aftercare: Secondary | ICD-10-CM | POA: Insufficient documentation

## 2011-05-22 DIAGNOSIS — R0609 Other forms of dyspnea: Secondary | ICD-10-CM | POA: Insufficient documentation

## 2011-05-22 DIAGNOSIS — J90 Pleural effusion, not elsewhere classified: Secondary | ICD-10-CM | POA: Insufficient documentation

## 2011-05-22 DIAGNOSIS — I10 Essential (primary) hypertension: Secondary | ICD-10-CM | POA: Insufficient documentation

## 2011-05-22 DIAGNOSIS — E785 Hyperlipidemia, unspecified: Secondary | ICD-10-CM | POA: Insufficient documentation

## 2011-05-22 DIAGNOSIS — R059 Cough, unspecified: Secondary | ICD-10-CM | POA: Insufficient documentation

## 2011-05-22 DIAGNOSIS — R0989 Other specified symptoms and signs involving the circulatory and respiratory systems: Secondary | ICD-10-CM | POA: Insufficient documentation

## 2011-05-22 DIAGNOSIS — K219 Gastro-esophageal reflux disease without esophagitis: Secondary | ICD-10-CM | POA: Insufficient documentation

## 2011-05-22 DIAGNOSIS — J8410 Pulmonary fibrosis, unspecified: Secondary | ICD-10-CM | POA: Insufficient documentation

## 2011-05-24 ENCOUNTER — Encounter (HOSPITAL_COMMUNITY)
Admission: RE | Admit: 2011-05-24 | Discharge: 2011-05-24 | Disposition: A | Discharge status: Home / Self Care | Payer: Medicare Other | Source: Ambulatory Visit | Attending: Internal Medicine | Admitting: Internal Medicine

## 2011-05-29 ENCOUNTER — Encounter (HOSPITAL_COMMUNITY)
Admission: RE | Admit: 2011-05-29 | Discharge: 2011-05-29 | Disposition: A | Discharge status: Home / Self Care | Payer: Medicare Other | Source: Ambulatory Visit | Attending: Internal Medicine | Admitting: Internal Medicine

## 2011-05-31 ENCOUNTER — Encounter (HOSPITAL_COMMUNITY)
Admission: RE | Admit: 2011-05-31 | Discharge: 2011-05-31 | Disposition: A | Discharge status: Home / Self Care | Payer: Medicare Other | Source: Ambulatory Visit | Attending: Internal Medicine | Admitting: Internal Medicine

## 2011-06-05 ENCOUNTER — Encounter (HOSPITAL_COMMUNITY)
Admission: RE | Admit: 2011-06-05 | Discharge: 2011-06-05 | Disposition: A | Discharge status: Home / Self Care | Payer: Medicare Other | Source: Ambulatory Visit | Attending: Internal Medicine | Admitting: Internal Medicine

## 2011-06-07 ENCOUNTER — Encounter (HOSPITAL_COMMUNITY)
Admission: RE | Admit: 2011-06-07 | Discharge: 2011-06-07 | Disposition: A | Discharge status: Home / Self Care | Payer: Medicare Other | Source: Ambulatory Visit | Attending: Internal Medicine | Admitting: Internal Medicine

## 2011-06-11 ENCOUNTER — Encounter (INDEPENDENT_AMBULATORY_CARE_PROVIDER_SITE_OTHER): Payer: Self-pay | Admitting: Surgery

## 2011-06-11 ENCOUNTER — Ambulatory Visit (INDEPENDENT_AMBULATORY_CARE_PROVIDER_SITE_OTHER): Payer: Medicare Other | Admitting: Surgery

## 2011-06-11 DIAGNOSIS — K4020 Bilateral inguinal hernia, without obstruction or gangrene, not specified as recurrent: Secondary | ICD-10-CM | POA: Insufficient documentation

## 2011-06-11 NOTE — Patient Instructions (Signed)
Hernia °A hernia occurs when an internal organ pushes out through a weak spot in the abdominal wall. Hernias most commonly occur in the groin and around the navel. Hernias often can be pushed back into place (reduced). Most hernias tend to get worse over time. Some abdominal hernias can get stuck in the opening (irreducible or incarcerated hernia) and cannot be reduced. An irreducible abdominal hernia which is tightly squeezed into the opening is at risk for impaired blood supply (strangulated hernia). A strangulated hernia is a medical emergency. Because of the risk for an irreducible or strangulated hernia, surgery may be recommended to repair a hernia. °CAUSES  °· Heavy lifting.  °· Prolonged coughing.  °· Straining to have a bowel movement.  °· A cut (incision) made during an abdominal surgery.  °HOME CARE INSTRUCTIONS  °· Bed rest is not required. You may continue your normal activities.  °· Avoid lifting more than 10 pounds (4.5 kg) or straining.  °· Cough gently. If you are a smoker it is best to stop. Even the best hernia repair can break down with the continual strain of coughing. Even if you do not have your hernia repaired, a cough will continue to aggravate the problem.  °· Do not wear anything tight over your hernia. Do not try to keep it in with an outside bandage or truss. These can damage abdominal contents if they are trapped within the hernia sac.  °· Eat a normal diet.  °· Avoid constipation. Straining over long periods of time will increase hernia size and encourage breakdown of repairs. If you cannot do this with diet alone, stool softeners may be used.  °SEEK IMMEDIATE MEDICAL CARE IF:  °· You have a fever.  °· You develop increasing abdominal pain.  °· You feel nauseous or vomit.  °· Your hernia is stuck outside the abdomen, looks discolored, feels hard, or is tender.  °· You have any changes in your bowel habits or in the hernia that are unusual for you.  °· You have increased pain or  swelling around the hernia.  °· You cannot push the hernia back in place by applying gentle pressure while lying down.  °MAKE SURE YOU:  °· Understand these instructions.  °· Will watch your condition.  °· Will get help right away if you are not doing well or get worse.  °Document Released: 05/07/2005 Document Revised: 01/17/2011 Document Reviewed: 12/25/2007 °[employer]® Patient Information ©2012 [employer]. °

## 2011-06-11 NOTE — Progress Notes (Signed)
Subjective:     Patient ID: [name] [name], male   DOB: [birth year], 77 y.o.   MRN: [id number]  HPI  [name] [name]  Aug 01, 1934 [id number]  Patient Care Team: Darrow Bussing, MD as PCP - General (Family Medicine) Waymon Budge, MD as Consulting Physician (Pulmonary Disease)  This patient is a 77 y.o.male who presents today for surgical evaluation at the request of Dr. Docia Chuck.   Reason for visit: Right groin swelling. A probable hernia.  Patient is a pleasant gentleman with pulmonary fibrosis. He is on a protocol followed by [facility] as well as Dr. Maple Hudson with the [facility]. He is in pulmonary rehabilitation 2 days a week. He is not on oxygen.  A few months ago he noted some groin swelling and discomfort. It is more noticeable. No episodes of incarceration or straining ablation. Does not have any symptoms on the left.  He has a history of small bladder cancer treated with cystoscopy by Dr. Isabel Caprice. No abdominal surgeries. No prior hernia surgeries. He does have a chronic cough and wonders if that is contributed to his hernia  Patient Active Problem List  Diagnoses  . HYPERLIPIDEMIA  . HYPERTENSION  . BRONCHIECTASIS  . PULMONARY FIBROSIS  . PULMONARY NODULE, RIGHT MIDDLE LOBE  . G E R D  . DYSPNEA  . BRONCHITIS, ACUTE  . Bilateral inguinal herniae (BIH), R>L    Past Medical History  Diagnosis Date  . Lung nodule   . IPF (idiopathic pulmonary fibrosis)   . Unspecified essential hypertension   . Other and unspecified hyperlipidemia   . Esophageal reflux   . Bradycardia   . Fluttering heart   . Palpitations   . Cancer     bladder    Past Surgical History  Procedure Date  . Cystoscopy   . US echocardiography 07/25/2009    ef 55-60%  . Cardiovascular stress test 07/29/2009    ef 64%    History   Social History  . Marital Status: Married    Spouse Name: N/A    Number of Children: N/A  . Years of Education: N/A   Occupational  History  . Retired    Social History Main Topics  . Smoking status: Never Smoker   . Smokeless tobacco: Not on file  . Alcohol Use: 1.2 oz/week    2 Cans of beer per week  . Drug Use: No  . Sexually Active: Not on file   Other Topics Concern  . Not on file   Social History Narrative  . No narrative on file    Family History  Problem Relation Age of Onset  . Coronary artery disease    . COPD    . Pulmonary fibrosis Brother   . COPD Mother   . Heart failure Father     Current outpatient prescriptions:atenolol (TENORMIN) 25 MG tablet, Take 1 tablet (25 mg total) by mouth daily., Disp: 90 tablet, Rfl: 3;  atorvastatin (LIPITOR) 80 MG tablet, Take 80 mg by mouth daily.  , Disp: , Rfl: ;  omeprazole (PRILOSEC) 20 MG capsule, Take 20 mg by mouth daily. 30 minutes before breakfast , Disp: , Rfl:  promethazine-codeine (PHENERGAN WITH CODEINE) 6.25-10 MG/5ML syrup, 1 teaspoon four times a day as needed for cough, Disp: 240 mL, Rfl: 1;  telmisartan (MICARDIS) 80 MG tablet, Take 80 mg by mouth daily.  , Disp: , Rfl:   No Known Allergies  BP 118/86  Pulse 60  Temp(Src) 97.6 F (  36.4 C) (Temporal)  Resp 12  Ht 5\' 7"  (1.702 m)  Wt 186 lb (84.369 kg)  BMI 29.13 kg/m2  '  Review of Systems  Constitutional: Negative for fever, chills and diaphoresis.  HENT: Negative for nosebleeds, sore throat, facial swelling, mouth sores, trouble swallowing and ear discharge.   Eyes: Negative for photophobia, discharge and visual disturbance.  Respiratory: Positive for cough. Negative for apnea, choking, chest tightness, shortness of breath, wheezing and stridor.   Cardiovascular: Negative for chest pain and palpitations.  Gastrointestinal: Negative for nausea, vomiting, abdominal pain, diarrhea, constipation, blood in stool, abdominal distention, anal bleeding and rectal pain.  Genitourinary: Negative for dysuria, urgency, discharge, difficulty urinating, penile pain and testicular pain.    Musculoskeletal: Negative for myalgias, back pain, arthralgias and gait problem.  Skin: Negative for color change, pallor, rash and wound.  Neurological: Negative for dizziness, speech difficulty, weakness, numbness and headaches.  Hematological: Negative for adenopathy. Does not bruise/bleed easily.  Psychiatric/Behavioral: Negative for hallucinations, confusion and agitation.       Objective:   Physical Exam  Constitutional: He is oriented to person, place, and time. He appears well-developed and well-nourished. No distress.  HENT:  Head: Normocephalic.  Mouth/Throat: Oropharynx is clear and moist. No oropharyngeal exudate.  Eyes: Conjunctivae and EOM are normal. Pupils are equal, round, and reactive to light. No scleral icterus.  Neck: Normal range of motion. Neck supple. No tracheal deviation present.  Cardiovascular: Normal rate, regular rhythm and intact distal pulses.   Pulmonary/Chest: Effort normal and breath sounds normal. No respiratory distress. He has no rales. He exhibits no tenderness.  Abdominal: Soft. He exhibits no distension and no mass. There is no tenderness.       Obese but soft  Genitourinary:     Musculoskeletal: Normal range of motion. He exhibits no tenderness.  Lymphadenopathy:    He has no cervical adenopathy.       Right: No inguinal adenopathy present.       Left: No inguinal adenopathy present.  Neurological: He is alert and oriented to person, place, and time. No cranial nerve deficit. He exhibits normal muscle tone. Coordination normal.  Skin: Skin is warm and dry. No rash noted. He is not diaphoretic. No erythema. No pallor.  Psychiatric: He has a normal mood and affect. His behavior is normal. Judgment and thought content normal.       Assessment:     BIH (R>>L)    Plan:     Given the size especially on the right side and discomfort I think he would benefit from repair. I would fix his left inguinal hernia as well.  Laparoscopy wound  provide benefit for this.  I would like to double check is okay with Dr. Maple Hudson his pulmonologist for him to do surgery.  The anatomy & physiology of the abdominal wall was discussed.  The pathophysiology of hernias was discussed.  Natural history risks without surgery of enlargement, pain, incarceration & strangulation was discussed.   Contributors to complications such as smoking, obesity, diabetes, prior surgery, etc were discussed.  I feel the risks of no intervention will lead to serious problems that outweigh the operative risks; therefore, I recommended surgery to reduce and repair the hernia.  I explained laparoscopic techniques with possible need for an open approach.  I noted the probable use of mesh to patch and/or buttress hernia repair  Risks such as bleeding, infection, abscess, need for further treatment, heart attack, death, and other risks were discussed.  I noted a good likelihood this will help address the problem.   Goals of post-operative recovery were discussed as well.  Possibility that this will not correct all symptoms was explained.  I stressed the importance of low-impact activity, aggressive pain control, avoiding constipation, & not pushing through pain to minimize risk of post-operative chronic pain or injury. Possibility of reherniation was discussed.  We will work to minimize complications.   An educational handout further explaining the pathology & treatment options was given as well.  Questions were answered.  The patient expresses understanding & wishes to proceed with surgery.

## 2011-06-12 ENCOUNTER — Encounter (HOSPITAL_COMMUNITY)
Admission: RE | Admit: 2011-06-12 | Discharge: 2011-06-12 | Disposition: A | Discharge status: Home / Self Care | Payer: Medicare Other | Source: Ambulatory Visit | Attending: Internal Medicine | Admitting: Internal Medicine

## 2011-06-14 ENCOUNTER — Encounter (HOSPITAL_COMMUNITY): Payer: Medicare Other

## 2011-06-19 ENCOUNTER — Encounter (HOSPITAL_COMMUNITY)
Admission: RE | Admit: 2011-06-19 | Discharge: 2011-06-19 | Disposition: A | Discharge status: Home / Self Care | Payer: Medicare Other | Source: Ambulatory Visit | Attending: Internal Medicine | Admitting: Internal Medicine

## 2011-06-20 ENCOUNTER — Telehealth: Payer: Self-pay | Admitting: Internal Medicine

## 2011-06-20 NOTE — Telephone Encounter (Signed)
Per Florentina Addison she has not had a fax come through on this pt. I spoke with Elease Hashimoto and advised her of this. I gave her triage fax # and will forward to [facility] to look out for fax

## 2011-06-21 ENCOUNTER — Encounter (HOSPITAL_COMMUNITY)
Admission: RE | Admit: 2011-06-21 | Discharge: 2011-06-21 | Disposition: A | Discharge status: Home / Self Care | Payer: Medicare Other | Source: Ambulatory Visit | Attending: Internal Medicine | Admitting: Internal Medicine

## 2011-06-21 NOTE — Telephone Encounter (Signed)
Message and clearance info placed on CY's cart to review and fill out.

## 2011-06-25 NOTE — Telephone Encounter (Signed)
Letter dictated and signed- needs to go with my last ov note to Dr [name].

## 2011-06-25 NOTE — Telephone Encounter (Signed)
Spoke with Dr [name]' office-aware that we have faxed the last office note, letter dictated by CY, and clearance surgery paperwork back to their office today.

## 2011-06-26 ENCOUNTER — Encounter (HOSPITAL_COMMUNITY)
Admission: RE | Admit: 2011-06-26 | Discharge: 2011-06-26 | Disposition: A | Discharge status: Home / Self Care | Payer: Medicare Other | Source: Ambulatory Visit | Attending: Internal Medicine | Admitting: Internal Medicine

## 2011-06-26 DIAGNOSIS — J90 Pleural effusion, not elsewhere classified: Secondary | ICD-10-CM | POA: Insufficient documentation

## 2011-06-26 DIAGNOSIS — R059 Cough, unspecified: Secondary | ICD-10-CM | POA: Insufficient documentation

## 2011-06-26 DIAGNOSIS — J8410 Pulmonary fibrosis, unspecified: Secondary | ICD-10-CM | POA: Insufficient documentation

## 2011-06-26 DIAGNOSIS — I10 Essential (primary) hypertension: Secondary | ICD-10-CM | POA: Insufficient documentation

## 2011-06-26 DIAGNOSIS — R0609 Other forms of dyspnea: Secondary | ICD-10-CM | POA: Insufficient documentation

## 2011-06-26 DIAGNOSIS — R0989 Other specified symptoms and signs involving the circulatory and respiratory systems: Secondary | ICD-10-CM | POA: Insufficient documentation

## 2011-06-26 DIAGNOSIS — Z5189 Encounter for other specified aftercare: Secondary | ICD-10-CM | POA: Insufficient documentation

## 2011-06-26 DIAGNOSIS — R05 Cough: Secondary | ICD-10-CM | POA: Insufficient documentation

## 2011-06-26 DIAGNOSIS — K219 Gastro-esophageal reflux disease without esophagitis: Secondary | ICD-10-CM | POA: Insufficient documentation

## 2011-06-26 DIAGNOSIS — E785 Hyperlipidemia, unspecified: Secondary | ICD-10-CM | POA: Insufficient documentation

## 2011-06-27 ENCOUNTER — Telehealth (INDEPENDENT_AMBULATORY_CARE_PROVIDER_SITE_OTHER): Payer: Self-pay

## 2011-06-27 NOTE — Telephone Encounter (Signed)
Called pt and spoke to the wife about receiving pulmonary clearance from Dr Jetty Duhamel. I advised them that I would take the surgical orders to our surgery schedulers and they would contact them to schedule surgery.

## 2011-06-28 ENCOUNTER — Encounter (HOSPITAL_COMMUNITY)
Admission: RE | Admit: 2011-06-28 | Discharge: 2011-06-28 | Disposition: A | Discharge status: Home / Self Care | Payer: Medicare Other | Source: Ambulatory Visit | Attending: Internal Medicine | Admitting: Internal Medicine

## 2011-06-29 NOTE — Letter (Signed)
June 20, 2011   Ardeth Sportsman, MD [facility] [address], [address] [place], [place] [address].  Reference:  Preoperative pulmonary clearance.  RE:  [name] [name], [name] [name] MRN:  [id number]  /  DOB:  September 11, 1934  Dear Dr. [name]:  I have your request for clearance comments anticipating bilateral inguinal hernia repair with mesh under general anesthesia.  A copy of my most recent office visit note from May 01, 2011 is enclosed.  [name] has been treated here and is followed in a research protocol at [facility] for idiopathic pulmonary fibrosis/usual interstitial pneumonitis.  He is dyspneic on hills and stairs, but has still been able to play some golf.  He has had an occasional viral pattern chest cold/bronchitis, but no prohibitive acute pulmonary events.  You can anticipate limited pulmonary reserve and the potential for some delay in extubation postoperatively.  Hopefully, things will go smoothly.  He has been followed by Dr. Cassell Clement for Cardiology for diagnosis of hypertension and hypercholesterolemia.   Sincerely,     Makyla Bye D. Maple Hudson, MD, Tonny Bollman, FACP Electronically Signed   CDY/MedQ  DD: 06/20/2011  DT: 06/20/2011  Job #: [id number]

## 2011-07-03 ENCOUNTER — Encounter (HOSPITAL_COMMUNITY)
Admission: RE | Admit: 2011-07-03 | Discharge: 2011-07-03 | Disposition: A | Discharge status: Home / Self Care | Payer: Medicare Other | Source: Ambulatory Visit | Attending: Internal Medicine | Admitting: Internal Medicine

## 2011-07-05 ENCOUNTER — Encounter (HOSPITAL_COMMUNITY)
Admission: RE | Admit: 2011-07-05 | Discharge: 2011-07-05 | Disposition: A | Discharge status: Home / Self Care | Payer: Medicare Other | Source: Ambulatory Visit | Attending: Internal Medicine | Admitting: Internal Medicine

## 2011-07-09 ENCOUNTER — Encounter (HOSPITAL_COMMUNITY): Payer: Self-pay | Admitting: Pharmacy Technician

## 2011-07-10 ENCOUNTER — Encounter (HOSPITAL_COMMUNITY): Payer: Medicare Other

## 2011-07-11 ENCOUNTER — Telehealth (INDEPENDENT_AMBULATORY_CARE_PROVIDER_SITE_OTHER): Payer: Self-pay

## 2011-07-11 NOTE — Telephone Encounter (Signed)
Pt called in stating that he saw his PCP at [facility] and was talking to him about his upcoming sx with DR Gross. The pt said his PCP wanted him to find out about the anesthesia that was going to be used on the pt b/c of his lung issues. I explained to the pt that general anesthesia was going to be used but I did not know the name of the drug that would be up to the anesthesiologist to decide. The pt will go have a preop appt and he could talk about that with the hospital. I did tell the pt that Dr [name] got pulmonary clearance on him b/c of his lung issues and Dr Maple Hudson cleared him to have surgery. If there are any issues about the lung the pt needs to call DR Maple Hudson. The pt requested I fax his PCP a office note. I will fax office note from 06-11-11 to fx#[phone] attn:Margarite/DR Metzer.

## 2011-07-12 ENCOUNTER — Telehealth (INDEPENDENT_AMBULATORY_CARE_PROVIDER_SITE_OTHER): Payer: Self-pay

## 2011-07-12 ENCOUNTER — Encounter (HOSPITAL_COMMUNITY)
Admission: RE | Admit: 2011-07-12 | Discharge: 2011-07-12 | Disposition: A | Discharge status: Home / Self Care | Payer: Medicare Other | Source: Ambulatory Visit | Attending: Internal Medicine | Admitting: Internal Medicine

## 2011-07-12 NOTE — Telephone Encounter (Signed)
Erica from the hospital calling to get surgical orders completed in epic on pt's surgery for 07-20-11 Lap BIH repair.

## 2011-07-13 ENCOUNTER — Other Ambulatory Visit: Payer: Self-pay

## 2011-07-13 ENCOUNTER — Encounter (HOSPITAL_COMMUNITY): Payer: Self-pay

## 2011-07-13 ENCOUNTER — Encounter (HOSPITAL_COMMUNITY)
Admission: RE | Admit: 2011-07-13 | Discharge: 2011-07-13 | Disposition: A | Discharge status: Home / Self Care | Payer: Medicare Other | Source: Ambulatory Visit | Attending: Surgery | Admitting: Surgery

## 2011-07-13 ENCOUNTER — Ambulatory Visit (HOSPITAL_COMMUNITY)
Admission: RE | Admit: 2011-07-13 | Discharge: 2011-07-13 | Disposition: A | Discharge status: Home / Self Care | Payer: Medicare Other | Source: Ambulatory Visit | Attending: Anesthesiology | Admitting: Anesthesiology

## 2011-07-13 DIAGNOSIS — Z01818 Encounter for other preprocedural examination: Secondary | ICD-10-CM | POA: Insufficient documentation

## 2011-07-13 DIAGNOSIS — Z01810 Encounter for preprocedural cardiovascular examination: Secondary | ICD-10-CM | POA: Insufficient documentation

## 2011-07-13 DIAGNOSIS — Z01812 Encounter for preprocedural laboratory examination: Secondary | ICD-10-CM | POA: Diagnosis not present

## 2011-07-13 DIAGNOSIS — K4020 Bilateral inguinal hernia, without obstruction or gangrene, not specified as recurrent: Secondary | ICD-10-CM | POA: Diagnosis not present

## 2011-07-13 DIAGNOSIS — Z01811 Encounter for preprocedural respiratory examination: Secondary | ICD-10-CM | POA: Diagnosis not present

## 2011-07-13 LAB — BASIC METABOLIC PANEL
CO2: 27 mEq/L (ref 19–32)
Calcium: 9.4 mg/dL (ref 8.4–10.5)
Glucose, Bld: 108 mg/dL — ABNORMAL HIGH (ref 70–99)
Sodium: 133 mEq/L — ABNORMAL LOW (ref 135–145)

## 2011-07-13 LAB — CBC
Hemoglobin: 15.9 g/dL (ref 13.0–17.0)
MCH: 30.1 pg (ref 26.0–34.0)
MCV: 83.4 fL (ref 78.0–100.0)
Platelets: 190 10*3/uL (ref 150–400)
RBC: 5.29 MIL/uL (ref 4.22–5.81)

## 2011-07-13 NOTE — Pre-Procedure Instructions (Signed)
20 [name] [name]  07/13/2011   Your procedure is scheduled on:  July 20, 2011 (Friday)  Report to [facility] at 8:30 AM.  Call this number if you have problems the morning of surgery: [phone]   Remember:   Do not eat food:After Midnight.  May have clear liquids: up to 4 Hours before arrival.  Clear liquids include soda, tea, black coffee, apple or grape juice, broth.  Take these medicines the morning of surgery with A SIP OF WATER: atenolol, prilosec, stop aspirin   Do not wear jewelry, make-up or nail polish.  Do not wear lotions, powders, or perfumes. You may wear deodorant.  Do not shave 48 hours prior to surgery.  Do not bring valuables to the hospital.  Contacts, dentures or bridgework may not be worn into surgery.  Leave suitcase in the car. After surgery it may be brought to your room.  For patients admitted to the hospital, checkout time is 11:00 AM the day of discharge.   Patients discharged the day of surgery will not be allowed to drive home.  Name and phone number of your driver: Gigi Gin [phone]  Special Instructions: CHG Shower Use Special Wash: 1/2 bottle night before surgery and 1/2 bottle morning of surgery.   Please read over the following fact sheets that you were given: Pain Booklet, MRSA Information and Surgical Site Infection Prevention

## 2011-07-13 NOTE — Consult Note (Signed)
Anesthesia:  Patient is a 77 year old male scheduled for laparoscopic repair of bilateral inguinal hernias on 07/20/11.  His history is significant for idiopathic pulmonary fibrosis.  He is in his second drug trial study at [facility] (pifendidone study).  His Pulmonologist there is Dr. Harvie Bridge.  He is also followed by Dr. Jetty Duhamel in [place].  He is in Pulmonary Rehab.  Currently he feels at his baseline.  According to [facility] notes, his PFTs on 07/10/11 showed FVC 2.32, 61% predicted; FEV1 1.83, 66% predicted; ratio 79%.  These were not changed from 13 weeks prior.  CXR today showed no active disease. Stable mild hyperinflation and chronic fibrotic changes.  Other history includes HTN, reflux, HLD, palpitations, bladder cancer, and RML lung nodule.  He has never smoked.  Dr. Patty Sermons is his Cardiologist/PCP.  He does not have a history of CAD, but has been evaluated for marked sinus bradycardia in the past.  EKG today showed SB @ 55 bpm.  On 07/29/09 he had a treadmill Cardiolite stress test which shows normal perfusion and no evidence of ischemia and his ejection fraction was 64%. Patient has a history of a echocardiogram on 07/25/09 showing normal left ventricular systolic function with impaired relaxation and showing mild aortic sclerosis without stenosis and normal pulmonary artery pressure.    Labs acceptable.  Dr. Maple Hudson cleared him from a Pulmonary standpoint for GA (see Letters tab).  [name] [name] is comfortable with GA, but is open to other means of Anesthesia (ie, local or spinal) if the Anesthesiologist has any concerns--even if that would mean a different repair approach.  I had Anesthesiologist Dr. Michelle Piper review his [facility] records, and based on what's on paper, he feels that plan for GA is reasonable.  However, [name] [name] assigned Anesthesiologist will evaluate him on the day of surgery, collaborate with Dr. [name], and discuss the definitive Anesthesia plan at that time.    Plan for  RA ABG on arrival.

## 2011-07-17 ENCOUNTER — Encounter (HOSPITAL_COMMUNITY)
Admission: RE | Admit: 2011-07-17 | Discharge: 2011-07-17 | Disposition: A | Discharge status: Home / Self Care | Payer: Medicare Other | Source: Ambulatory Visit | Attending: Internal Medicine | Admitting: Internal Medicine

## 2011-07-17 DIAGNOSIS — Z8551 Personal history of malignant neoplasm of bladder: Secondary | ICD-10-CM | POA: Diagnosis not present

## 2011-07-17 DIAGNOSIS — N401 Enlarged prostate with lower urinary tract symptoms: Secondary | ICD-10-CM | POA: Diagnosis not present

## 2011-07-17 NOTE — Progress Notes (Signed)
Dr. [name]' office called([phone]) and notified that no orders in Epic for procedure.  Sarah, RN @ office, reported that she would notify Dr. [name] when he returns to office tomorrow.//L. Celena Lanius,RN

## 2011-07-18 ENCOUNTER — Other Ambulatory Visit (INDEPENDENT_AMBULATORY_CARE_PROVIDER_SITE_OTHER): Payer: Self-pay | Admitting: Surgery

## 2011-07-19 ENCOUNTER — Encounter (HOSPITAL_COMMUNITY)
Admission: RE | Admit: 2011-07-19 | Discharge: 2011-07-19 | Disposition: A | Discharge status: Home / Self Care | Payer: Medicare Other | Source: Ambulatory Visit | Attending: Internal Medicine | Admitting: Internal Medicine

## 2011-07-19 MED ORDER — CHLORHEXIDINE GLUCONATE 4 % EX LIQD
1.0000 "application " | Freq: Once | CUTANEOUS | Status: DC
  Filled 2011-07-19: qty 15

## 2011-07-19 MED ORDER — CEFAZOLIN SODIUM-DEXTROSE 2-3 GM-% IV SOLR
2.0000 g | INTRAVENOUS | Status: AC
  Administered 2011-07-20: 2 g via INTRAVENOUS
  Filled 2011-07-19: qty 50

## 2011-07-20 ENCOUNTER — Encounter (HOSPITAL_COMMUNITY): Payer: Self-pay | Admitting: Vascular Surgery

## 2011-07-20 ENCOUNTER — Encounter (HOSPITAL_COMMUNITY): Payer: Self-pay | Admitting: *Deleted

## 2011-07-20 ENCOUNTER — Ambulatory Visit (HOSPITAL_COMMUNITY): Payer: Medicare Other | Admitting: Vascular Surgery

## 2011-07-20 ENCOUNTER — Ambulatory Visit (HOSPITAL_COMMUNITY)
Admission: RE | Admit: 2011-07-20 | Discharge: 2011-07-20 | Disposition: A | Discharge status: Home / Self Care | Payer: Medicare Other | Source: Ambulatory Visit | Attending: Surgery | Admitting: Surgery

## 2011-07-20 ENCOUNTER — Emergency Department (HOSPITAL_COMMUNITY)
Admission: EM | Admit: 2011-07-20 | Discharge: 2011-07-21 | Disposition: A | Discharge status: Home Health | Payer: Medicare Other | Attending: Emergency Medicine | Admitting: Emergency Medicine

## 2011-07-20 ENCOUNTER — Encounter (HOSPITAL_COMMUNITY)
Admission: RE | Disposition: A | Discharge status: Home / Self Care | Payer: Self-pay | Source: Ambulatory Visit | Attending: Surgery

## 2011-07-20 DIAGNOSIS — J479 Bronchiectasis, uncomplicated: Secondary | ICD-10-CM | POA: Diagnosis not present

## 2011-07-20 DIAGNOSIS — I10 Essential (primary) hypertension: Secondary | ICD-10-CM | POA: Diagnosis not present

## 2011-07-20 DIAGNOSIS — K4020 Bilateral inguinal hernia, without obstruction or gangrene, not specified as recurrent: Secondary | ICD-10-CM | POA: Insufficient documentation

## 2011-07-20 DIAGNOSIS — R339 Retention of urine, unspecified: Secondary | ICD-10-CM | POA: Insufficient documentation

## 2011-07-20 DIAGNOSIS — Z79899 Other long term (current) drug therapy: Secondary | ICD-10-CM | POA: Insufficient documentation

## 2011-07-20 DIAGNOSIS — Z7982 Long term (current) use of aspirin: Secondary | ICD-10-CM | POA: Insufficient documentation

## 2011-07-20 DIAGNOSIS — K219 Gastro-esophageal reflux disease without esophagitis: Secondary | ICD-10-CM | POA: Insufficient documentation

## 2011-07-20 DIAGNOSIS — E785 Hyperlipidemia, unspecified: Secondary | ICD-10-CM | POA: Insufficient documentation

## 2011-07-20 DIAGNOSIS — K4190 Unilateral femoral hernia, without obstruction or gangrene, not specified as recurrent: Secondary | ICD-10-CM | POA: Diagnosis not present

## 2011-07-20 DIAGNOSIS — Z8551 Personal history of malignant neoplasm of bladder: Secondary | ICD-10-CM | POA: Insufficient documentation

## 2011-07-20 DIAGNOSIS — R109 Unspecified abdominal pain: Secondary | ICD-10-CM | POA: Insufficient documentation

## 2011-07-20 HISTORY — PX: FEMORAL HERNIA REPAIR: SHX632

## 2011-07-20 HISTORY — PX: INGUINAL HERNIA REPAIR: SHX194

## 2011-07-20 LAB — BLOOD GAS, ARTERIAL
Acid-Base Excess: 2.1 mmol/L — ABNORMAL HIGH (ref 0.0–2.0)
Bicarbonate: 25.8 mEq/L — ABNORMAL HIGH (ref 20.0–24.0)
O2 Saturation: 98.3 %
TCO2: 26.9 mmol/L (ref 0–100)
pO2, Arterial: 106 mmHg — ABNORMAL HIGH (ref 80.0–100.0)

## 2011-07-20 LAB — URINALYSIS, ROUTINE W REFLEX MICROSCOPIC
Bilirubin Urine: NEGATIVE
Glucose, UA: NEGATIVE mg/dL
Hgb urine dipstick: NEGATIVE
Ketones, ur: NEGATIVE mg/dL
Leukocytes, UA: NEGATIVE
Nitrite: NEGATIVE
Protein, ur: NEGATIVE mg/dL
Specific Gravity, Urine: 1.027 (ref 1.005–1.030)
Urobilinogen, UA: 0.2 mg/dL (ref 0.0–1.0)
pH: 6 (ref 5.0–8.0)

## 2011-07-20 LAB — POCT I-STAT, CHEM 8
BUN: 20 mg/dL (ref 6–23)
Calcium, Ion: 1.1 mmol/L — ABNORMAL LOW (ref 1.12–1.32)
Creatinine, Ser: 1.6 mg/dL — ABNORMAL HIGH (ref 0.50–1.35)
TCO2: 28 mmol/L (ref 0–100)

## 2011-07-20 SURGERY — REPAIR, HERNIA, INGUINAL, BILATERAL, LAPAROSCOPIC
Anesthesia: General | Site: Pelvis | Laterality: Left | Wound class: Clean

## 2011-07-20 MED ORDER — NEOSTIGMINE METHYLSULFATE 1 MG/ML IJ SOLN
INTRAMUSCULAR | Status: DC | PRN
  Administered 2011-07-20: 4 mg via INTRAVENOUS

## 2011-07-20 MED ORDER — GLYCOPYRROLATE 0.2 MG/ML IJ SOLN
INTRAMUSCULAR | Status: DC | PRN
  Administered 2011-07-20: .7 mg via INTRAVENOUS

## 2011-07-20 MED ORDER — FENTANYL CITRATE 0.05 MG/ML IJ SOLN
INTRAMUSCULAR | Status: DC | PRN
  Administered 2011-07-20: 100 ug via INTRAVENOUS
  Administered 2011-07-20: 50 ug via INTRAVENOUS
  Administered 2011-07-20: 100 ug via INTRAVENOUS

## 2011-07-20 MED ORDER — LACTATED RINGERS IV SOLN
INTRAVENOUS | Status: DC | PRN
  Administered 2011-07-20 (×2): via INTRAVENOUS

## 2011-07-20 MED ORDER — PROPOFOL 10 MG/ML IV EMUL
INTRAVENOUS | Status: DC | PRN
  Administered 2011-07-20: 140 mg via INTRAVENOUS

## 2011-07-20 MED ORDER — EPHEDRINE SULFATE 50 MG/ML IJ SOLN
INTRAMUSCULAR | Status: DC | PRN
  Administered 2011-07-20: 10 mg via INTRAVENOUS

## 2011-07-20 MED ORDER — LACTATED RINGERS IV SOLN
INTRAVENOUS | Status: DC
  Administered 2011-07-20: 09:00:00 via INTRAVENOUS

## 2011-07-20 MED ORDER — ONDANSETRON HCL 4 MG/2ML IJ SOLN: 4.0000 mg | Freq: Once | INTRAMUSCULAR | Status: DC | PRN

## 2011-07-20 MED ORDER — HYDROMORPHONE HCL PF 1 MG/ML IJ SOLN
INTRAMUSCULAR | Status: AC
  Filled 2011-07-20: qty 1

## 2011-07-20 MED ORDER — LIDOCAINE HCL (CARDIAC) 20 MG/ML IV SOLN
INTRAVENOUS | Status: DC | PRN
  Administered 2011-07-20: 50 mg via INTRAVENOUS

## 2011-07-20 MED ORDER — BUPIVACAINE-EPINEPHRINE 0.25% -1:200000 IJ SOLN
INTRAMUSCULAR | Status: DC | PRN
  Administered 2011-07-20: 46 mL

## 2011-07-20 MED ORDER — SODIUM CHLORIDE 0.9 % IR SOLN
Status: DC | PRN
  Administered 2011-07-20: 1000 mL

## 2011-07-20 MED ORDER — ONDANSETRON HCL 4 MG/2ML IJ SOLN
INTRAMUSCULAR | Status: DC | PRN
  Administered 2011-07-20: 4 mg via INTRAVENOUS

## 2011-07-20 MED ORDER — 0.9 % SODIUM CHLORIDE (POUR BTL) OPTIME
TOPICAL | Status: DC | PRN
  Administered 2011-07-20: 1000 mL

## 2011-07-20 MED ORDER — ROCURONIUM BROMIDE 100 MG/10ML IV SOLN
INTRAVENOUS | Status: DC | PRN
  Administered 2011-07-20: 15 mg via INTRAVENOUS
  Administered 2011-07-20: 35 mg via INTRAVENOUS

## 2011-07-20 MED ORDER — OXYCODONE HCL 5 MG PO TABS: 5.0000 mg | ORAL_TABLET | Freq: Four times a day (QID) | ORAL | Status: AC | PRN

## 2011-07-20 MED ORDER — HYDROMORPHONE HCL PF 1 MG/ML IJ SOLN
0.2500 mg | INTRAMUSCULAR | Status: DC | PRN
  Administered 2011-07-20: 0.5 mg via INTRAVENOUS

## 2011-07-20 MED ORDER — HETASTARCH-ELECTROLYTES 6 % IV SOLN
INTRAVENOUS | Status: DC | PRN
  Administered 2011-07-20: 11:00:00 via INTRAVENOUS

## 2011-07-20 SURGICAL SUPPLY — 45 items
APPLIER CLIP 5 13 M/L LIGAMAX5 (MISCELLANEOUS)
CANISTER SUCTION 2500CC (MISCELLANEOUS) IMPLANT
CHLORAPREP W/TINT 26ML (MISCELLANEOUS) ×3 IMPLANT
CLIP APPLIE 5 13 M/L LIGAMAX5 (MISCELLANEOUS) IMPLANT
CLOTH BEACON ORANGE TIMEOUT ST (SAFETY) ×3 IMPLANT
COVER SURGICAL LIGHT HANDLE (MISCELLANEOUS) ×3 IMPLANT
DRAPE WARM FLUID 44X44 (DRAPE) ×3 IMPLANT
DRSG TEGADERM 4X4.75 (GAUZE/BANDAGES/DRESSINGS) ×3 IMPLANT
ELECT REM PT RETURN 9FT ADLT (ELECTROSURGICAL) ×3
ELECTRODE REM PT RTRN 9FT ADLT (ELECTROSURGICAL) ×2 IMPLANT
GAUZE SPONGE 2X2 8PLY STRL LF (GAUZE/BANDAGES/DRESSINGS) ×2 IMPLANT
GLOVE BIO SURGEON STRL SZ7.5 (GLOVE) ×3 IMPLANT
GLOVE BIOGEL PI IND STRL 7.0 (GLOVE) ×2 IMPLANT
GLOVE BIOGEL PI IND STRL 7.5 (GLOVE) ×4 IMPLANT
GLOVE BIOGEL PI IND STRL 8 (GLOVE) ×2 IMPLANT
GLOVE BIOGEL PI INDICATOR 7.0 (GLOVE) ×1
GLOVE BIOGEL PI INDICATOR 7.5 (GLOVE) ×2
GLOVE BIOGEL PI INDICATOR 8 (GLOVE) ×1
GLOVE ECLIPSE 6.5 STRL STRAW (GLOVE) ×6 IMPLANT
GLOVE ECLIPSE 8.0 STRL XLNG CF (GLOVE) ×3 IMPLANT
GLOVE SURG SS PI 7.0 STRL IVOR (GLOVE) ×3 IMPLANT
GOWN PREVENTION PLUS XLARGE (GOWN DISPOSABLE) ×3 IMPLANT
GOWN STRL NON-REIN LRG LVL3 (GOWN DISPOSABLE) ×6 IMPLANT
KIT BASIN OR (CUSTOM PROCEDURE TRAY) ×3 IMPLANT
KIT ROOM TURNOVER OR (KITS) ×3 IMPLANT
MESH ULTRAPRO 6X6 15CM15CM (Mesh General) ×6 IMPLANT
NEEDLE 22X1 1/2 (OR ONLY) (NEEDLE) ×3 IMPLANT
NS IRRIG 1000ML POUR BTL (IV SOLUTION) ×3 IMPLANT
PAD ARMBOARD 7.5X6 YLW CONV (MISCELLANEOUS) ×6 IMPLANT
SCISSORS LAP 5X35 DISP (ENDOMECHANICALS) IMPLANT
SET IRRIG TUBING LAPAROSCOPIC (IRRIGATION / IRRIGATOR) ×3 IMPLANT
SLEEVE Z-THREAD 5X100MM (TROCAR) ×3 IMPLANT
SPONGE GAUZE 2X2 STER 10/PKG (GAUZE/BANDAGES/DRESSINGS) ×1
SUT MNCRL AB 4-0 PS2 18 (SUTURE) ×3 IMPLANT
SUT VIC AB 3-0 SH 27 (SUTURE)
SUT VIC AB 3-0 SH 27XBRD (SUTURE) IMPLANT
TACKER 5MM HERNIA 3.5CML NAB (ENDOMECHANICALS) ×3 IMPLANT
TOWEL OR 17X24 6PK STRL BLUE (TOWEL DISPOSABLE) ×3 IMPLANT
TOWEL OR 17X26 10 PK STRL BLUE (TOWEL DISPOSABLE) ×3 IMPLANT
TOWEL OR NON WOVEN STRL DISP B (DISPOSABLE) ×3 IMPLANT
TRAY FOLEY CATH 14FR (SET/KITS/TRAYS/PACK) ×3 IMPLANT
TRAY LAPAROSCOPIC (CUSTOM PROCEDURE TRAY) ×3 IMPLANT
TROCAR XCEL BLUNT TIP 100MML (ENDOMECHANICALS) ×3 IMPLANT
TROCAR Z-THREAD FIOS 5X100MM (TROCAR) ×3 IMPLANT
WATER STERILE IRR 1000ML POUR (IV SOLUTION) IMPLANT

## 2011-07-20 NOTE — Anesthesia Preprocedure Evaluation (Addendum)
Anesthesia Evaluation  Patient identified by MRN, date of birth, ID band Patient awake    Reviewed: Allergy & Precautions, H&P , NPO status , Patient's Chart, lab work & pertinent test results, reviewed documented beta blocker date and time   Airway Mallampati: I TM Distance: >3 FB Neck ROM: full    Dental  (+) Partial Lower, Partial Upper, Teeth Intact and Dental Advisory Given   Pulmonary shortness of breath, pneumonia ,          Cardiovascular hypertension, Pt. on medications and Pt. on home beta blockers regular Normal    Neuro/Psych    GI/Hepatic GERD-  Medicated and Controlled,  Endo/Other    Renal/GU      Musculoskeletal   Abdominal   Peds  Hematology   Anesthesia Other Findings   Reproductive/Obstetrics                          Anesthesia Physical Anesthesia Plan  ASA: III  Anesthesia Plan: General ETT   Post-op Pain Management:    Induction: Intravenous  Airway Management Planned: Oral ETT  Additional Equipment:   Intra-op Plan:   Post-operative Plan: Extubation in OR  Informed Consent: I have reviewed the patients History and Physical, chart, labs and discussed the procedure including the risks, benefits and alternatives for the proposed anesthesia with the patient or authorized representative who has indicated his/her understanding and acceptance.   Dental advisory given  Plan Discussed with: Anesthesiologist, CRNA and Surgeon  Anesthesia Plan Comments:        Anesthesia Quick Evaluation

## 2011-07-20 NOTE — Progress Notes (Signed)
Pt qattempted to void. Unsuccessful.  Pt verbalized understanding of needing to return to ED if unable to void by 1930 to go to ED.Pt discharged to home.//L. Jordyne Poehlman,RN

## 2011-07-20 NOTE — H&P (Signed)
[name] [name]  April 30, 1935  [id number]     Eugenie Birks  September 01, 1934 [id number]  Patient Care Team: Darrow Bussing, MD as PCP - General (Family Medicine) Waymon Budge, MD as Consulting Physician (Pulmonary Disease)   Patient is a pleasant gentleman with pulmonary fibrosis. He is on a protocol followed by [facility] as well as Dr. Maple Hudson with the [facility]. He is in pulmonary rehabilitation 2 days a week. He is not on oxygen.   A few months ago he noted some groin swelling and discomfort. It is more noticeable. No episodes of incarceration or straining ablation. Does not have any symptoms on the left.  He has a history of small bladder cancer treated with cystoscopy by Dr. Isabel Caprice. No abdominal surgeries. No prior hernia surgeries. He does have a chronic cough and wonders if that is contributed to his hernia .   I recommended lap BIH repair.  He has been cleared   Patient Active Problem List  Diagnoses  . HYPERLIPIDEMIA  . HYPERTENSION  . BRONCHIECTASIS  . PULMONARY FIBROSIS  . PULMONARY NODULE, RIGHT MIDDLE LOBE  . G E R D  . DYSPNEA  . BRONCHITIS, ACUTE  . Bilateral inguinal herniae (BIH), R>L    Past Medical History  Diagnosis Date  . Lung nodule   . IPF (idiopathic pulmonary fibrosis)   . Unspecified essential hypertension   . Other and unspecified hyperlipidemia   . Esophageal reflux   . Bradycardia   . Fluttering heart   . Palpitations   . Cancer     bladder  . Pulmonary fibrosis     Past Surgical History  Procedure Date  . Cystoscopy     removal of bladder tumor  . US echocardiography 07/25/2009    ef 55-60%  . Cardiovascular stress test 07/29/2009    ef 64%    History   Social History  . Marital Status: Married    Spouse Name: N/A    Number of Children: N/A  . Years of Education: N/A   Occupational History  . Retired    Social History Main Topics  . Smoking status: Never Smoker   . Smokeless tobacco: Not  on file  . Alcohol Use: 1.2 oz/week    2 Cans of beer per week  . Drug Use: No  . Sexually Active: Not on file   Other Topics Concern  . Not on file   Social History Narrative  . No narrative on file    Family History  Problem Relation Age of Onset  . Coronary artery disease    . COPD    . Pulmonary fibrosis Brother   . COPD Mother   . Heart failure Father     Current facility-administered medications:ceFAZolin (ANCEF) IVPB 2 g/50 mL premix, 2 g, Intravenous, 60 min Pre-Op, Ardeth Sportsman, MD;  chlorhexidine (HIBICLENS) 4 % liquid 1 application, 1 application, Topical, Once, Ardeth Sportsman, MD;  chlorhexidine (HIBICLENS) 4 % liquid 1 application, 1 application, Topical, Once, Ardeth Sportsman, MD lactated ringers infusion, , Intravenous, Continuous, Rivka Barbara, MD, Last Rate: 50 mL/hr at 07/20/11 0847  No Known Allergies  BP 143/83  Pulse 52  Temp(Src) 98.1 F (36.7 C) (Oral)  Resp 18  Ht 5\' 7"  (1.702 m)  Wt 186 lb (84.369 kg)  BMI 29.13 kg/m2  SpO2 97%      Review of Systems  Constitutional: Negative for fever, chills and diaphoresis.  HENT: Negative for nosebleeds, sore  throat, facial swelling, mouth sores, trouble swallowing and ear discharge.  Eyes: Negative for photophobia, discharge and visual disturbance.  Respiratory: Positive for cough. Negative for apnea, choking, chest tightness, shortness of breath, wheezing and stridor.  Cardiovascular: Negative for chest pain and palpitations.  Gastrointestinal: Negative for nausea, vomiting, abdominal pain, diarrhea, constipation, blood in stool, abdominal distention, anal bleeding and rectal pain.  Genitourinary: Negative for dysuria, urgency, discharge, difficulty urinating, penile pain and testicular pain.  Musculoskeletal: Negative for myalgias, back pain, arthralgias and gait problem.  Skin: Negative for color change, pallor, rash and wound.  Neurological: Negative for dizziness, speech difficulty,  weakness, numbness and headaches.  Hematological: Negative for adenopathy. Does not bruise/bleed easily.  Psychiatric/Behavioral: Negative for hallucinations, confusion and agitation.  Objective:   Physical Exam  Constitutional: He is oriented to person, place, and time. He appears well-developed and well-nourished. No distress.  HENT:  Head: Normocephalic.  Mouth/Throat: Oropharynx is clear and moist. No oropharyngeal exudate.  Eyes: Conjunctivae and EOM are normal. Pupils are equal, round, and reactive to light. No scleral icterus.  Neck: Normal range of motion. Neck supple. No tracheal deviation present.  Cardiovascular: Normal rate, regular rhythm and intact distal pulses.  Pulmonary/Chest: Effort normal and breath sounds normal. No respiratory distress. He has no rales. He exhibits no tenderness.  Abdominal: Soft. He exhibits no distension and no mass. There is no tenderness.  Obese but soft  Genitourinary:   BIH (R>>L)  Musculoskeletal: Normal range of motion. He exhibits no tenderness.  Lymphadenopathy:  He has no cervical adenopathy.  Right: No inguinal adenopathy present.  Left: No inguinal adenopathy present.  Neurological: He is alert and oriented to person, place, and time. No cranial nerve deficit. He exhibits normal muscle tone. Coordination normal.  Skin: Skin is warm and dry. No rash noted. He is not diaphoretic. No erythema. No pallor.  Psychiatric: He has a normal mood and affect. His behavior is normal. Judgment and thought content normal.  Assessment:   BIH (R>>L)  Plan:   Given the size especially on the right side and discomfort I think he would benefit from repair. I would fix his left inguinal hernia as well. Laparoscopy wound provide benefit for this. He has been checked by Dr. Maple Hudson his pulmonologist & is cleared for surgery.   The anatomy & physiology of the abdominal wall was discussed. The pathophysiology of hernias was discussed. Natural history risks  without surgery of enlargement, pain, incarceration & strangulation was discussed. Contributors to complications such as smoking, obesity, diabetes, prior surgery, etc were discussed. I feel the risks of no intervention will lead to serious problems that outweigh the operative risks; therefore, I recommended surgery to reduce and repair the hernia. I explained laparoscopic techniques with possible need for an open approach. I noted the probable use of mesh to patch and/or buttress hernia repair.   Risks such as bleeding, infection, abscess, need for further treatment, heart attack, death, and other risks were discussed. I noted a good likelihood this will help address the problem. Goals of post-operative recovery were discussed as well. Possibility that this will not correct all symptoms was explained. I stressed the importance of low-impact activity, aggressive pain control, avoiding constipation, & not pushing through pain to minimize risk of post-operative chronic pain or injury. Possibility of reherniation was discussed. We will work to minimize complications. An educational handout further explaining the pathology & treatment options was given as well. Questions were answered. The patient expresses understanding & wishes to  proceed with surgery.

## 2011-07-20 NOTE — Transfer of Care (Signed)
Immediate Anesthesia Transfer of Care Note  Patient: [name] [name]  Procedure(s) Performed: Procedure(s) (LRB): LAPAROSCOPIC BILATERAL INGUINAL HERNIA REPAIR (Bilateral) INSERTION OF MESH (Bilateral) HERNIA REPAIR FEMORAL (Left)  Patient Location: PACU  Anesthesia Type: General  Level of Consciousness: awake, alert  and oriented  Airway & Oxygen Therapy: Patient Spontanous Breathing and Patient connected to face mask oxygen  Post-op Assessment: Report given to PACU RN, Post -op Vital signs reviewed and stable and Patient moving all extremities  Post vital signs: Reviewed and stable  Complications: No apparent anesthesia complications

## 2011-07-20 NOTE — Discharge Instructions (Signed)
HERNIA REPAIR: POST OP INSTRUCTIONS  1. DIET: Follow a light bland diet the first 24 hours after arrival home, such as soup, liquids, crackers, etc.  Be sure to include lots of fluids daily.  Avoid fast food or heavy meals as your are more likely to get nauseated.  Eat a low fat the next few days after surgery. 2. Take your usually prescribed home medications unless otherwise directed. 3. PAIN CONTROL: a. Pain is best controlled by a usual combination of three different methods TOGETHER: i. Ice/Heat ii. Over the counter pain medication iii. Prescription pain medication b. Most patients will experience some swelling and bruising around the hernia(s) such as the bellybutton, groins, or old incisions.  Ice packs or heating pads (30-60 minutes up to 6 times a day) will help. Use ice for the first few days to help decrease swelling and bruising, then switch to heat to help relax tight/sore spots and speed recovery.  Some people prefer to use ice alone, heat alone, alternating between ice & heat.  Experiment to what works for you.  Swelling and bruising can take several weeks to resolve.   c. It is helpful to take an over-the-counter pain medication regularly for the first few weeks.  Choose one of the following that works best for you: i. Naproxen (Aleve, etc)  Two 220mg tabs twice a day ii. Ibuprofen (Advil, etc) Three 200mg tabs four times a day (every meal & bedtime) iii. Acetaminophen (Tylenol, etc) 325-650mg four times a day (every meal & bedtime) d. A  prescription for pain medication should be given to you upon discharge.  Take your pain medication as prescribed.  i. If you are having problems/concerns with the prescription medicine (does not control pain, nausea, vomiting, rash, itching, etc), please call us [phone] to see if we need to switch you to a different pain medicine that will work better for you and/or control your side effect better. ii. If you need a refill on your pain  medication, please contact your pharmacy.  They will contact our office to request authorization. Prescriptions will not be filled after 5 pm or on week-ends. 4. Avoid getting constipated.  Between the surgery and the pain medications, it is common to experience some constipation.  Increasing fluid intake and taking a fiber supplement (such as Metamucil, Citrucel, FiberCon, MiraLax, etc) 1-2 times a day regularly will usually help prevent this problem from occurring.  A mild laxative (prune juice, Milk of Magnesia, MiraLax, etc) should be taken according to package directions if there are no bowel movements after 48 hours.   5. Wash / shower every day.  You may shower over the dressings as they are waterproof.   6. Remove your waterproof bandages 5 days after surgery.  You may leave the incision open to air.  You may replace a dressing/Band-Aid to cover the incision for comfort if you wish.  Continue to shower over incision(s) after the dressing is off.    7. ACTIVITIES as tolerated:   a. You may resume regular (light) daily activities beginning the next day-such as daily self-care, walking, climbing stairs-gradually increasing activities as tolerated.  If you can walk 30 minutes without difficulty, it is safe to try more intense activity such as jogging, treadmill, bicycling, low-impact aerobics, swimming, etc. b. Save the most intensive and strenuous activity for last such as sit-ups, heavy lifting, contact sports, etc  Refrain from any heavy lifting or straining until you are off narcotics for pain control.     c. DO NOT PUSH THROUGH PAIN.  Let pain be your guide: If it hurts to do something, don't do it.  Pain is your body warning you to avoid that activity for another week until the pain goes down. d. You may drive when you are no longer taking prescription pain medication, you can comfortably wear a seatbelt, and you can safely maneuver your car and apply brakes. e. You may have sexual intercourse  when it is comfortable.  8. FOLLOW UP in our office a. Please call [facility] at [phone] to set up an appointment to see your surgeon in the office for a follow-up appointment approximately 2-3 weeks after your surgery. b. Make sure that you call for this appointment the day you arrive home to insure a convenient appointment time. 9.  IF YOU HAVE DISABILITY OR FAMILY LEAVE FORMS, BRING THEM TO THE OFFICE FOR PROCESSING.  DO NOT GIVE THEM TO YOUR DOCTOR.  WHEN TO CALL US [phone]: 1. Poor pain control 2. Reactions / problems with new medications (rash/itching, nausea, etc)  3. Fever over 101.5 F (38.5 C) 4. Inability to urinate 5. Nausea and/or vomiting 6. Worsening swelling or bruising 7. Continued bleeding from incision. 8. Increased pain, redness, or drainage from the incision   The clinic staff is available to answer your questions during regular business hours (8:30am-5pm).  Please don't hesitate to call and ask to speak to one of our nurses for clinical concerns.   If you have a medical emergency, go to the nearest emergency room or call 911.  A surgeon from [facility] is always on call at the hospitals in [place]  [facility] [address], [address], [place], [place]  [address] ?  [address] [address], [place], [place]   [address] MAIN: [phone] ? TOLL FREE: [phone] ? FAX: [phone] [email or web address]  

## 2011-07-20 NOTE — ED Notes (Signed)
PT had hernia surg. This am has not voided since being discharged.  Pt has been drinking fluids and also ate watermelon. Still unable to urinate.  Pt given ice pack for incision site.

## 2011-07-20 NOTE — Preoperative (Signed)
Beta Blockers   Reason not to administer Beta Blockers:Not Applicable. Tenormin 25 mg @ 0630 07/20/11

## 2011-07-20 NOTE — Progress Notes (Addendum)
Pt unable to void.  Attempted x2.  Iv fluids infusing at [facility]  Pt has drank of coke.  Dr. [name] notified. He stated that pt was in & out cath at end of procedured and should not be in & out cath'd for 6 more hours.  He ordered that pt may go home if he understands that if he does not void in 6 hours to return to ED; or he could stay until 8pm at which time he does not void he may be in cath'd w/ leg bag attached..  Pt notified of orders.  Will stay for 30 more minutes.  Will assess at that time.//L. Shiquan Mathieu,RN

## 2011-07-20 NOTE — Anesthesia Procedure Notes (Signed)
Procedure Name: Intubation Date/Time: 07/20/2011 10:56 AM Performed by: Margaree Mackintosh Pre-anesthesia Checklist: Patient identified, Timeout performed, Emergency Drugs available, Suction available and Patient being monitored Patient Re-evaluated:Patient Re-evaluated prior to inductionOxygen Delivery Method: Circle system utilized Preoxygenation: Pre-oxygenation with 100% oxygen Intubation Type: IV induction Ventilation: Mask ventilation without difficulty Laryngoscope Size: Mac and 3 Grade View: Grade I Tube type: Oral Tube size: 8.0 mm Number of attempts: 1 Airway Equipment and Method: Stylet Placement Confirmation: ETT inserted through vocal cords under direct vision,  breath sounds checked- equal and bilateral and positive ETCO2 Secured at: 22 cm Tube secured with: Tape Dental Injury: Teeth and Oropharynx as per pre-operative assessment

## 2011-07-20 NOTE — ED Notes (Signed)
Urine collected was from foley catheter

## 2011-07-20 NOTE — Brief Op Note (Signed)
07/20/2011  12:44 PM  PATIENT:  [name] [name]  77 y.o. male  PRE-OPERATIVE DIAGNOSIS:  Bilateral Inguinal Hernia   POST-OPERATIVE DIAGNOSIS:    Bilateral Inguinal Hernias (R>>L) Left femoral hernia  PROCEDURE:  Procedure(s) (LRB): LAPAROSCOPIC BILATERAL INGUINAL HERNIA REPAIR (Bilateral) INSERTION OF MESH (Bilateral) HERNIA REPAIR FEMORAL (Left)  SURGEON:  Surgeon(s) and Role:    * Ardeth Sportsman, MD - Primary  PHYSICIAN ASSISTANT:   ASSISTANTS: none   ANESTHESIA:   local and general  EBL:  Total I/O In: 1700 [I.V.:1200; IV Piggyback:500] Out: -   BLOOD ADMINISTERED:none  DRAINS: none   LOCAL MEDICATIONS USED:  BUPIVICAINE   SPECIMEN:  No Specimen  DISPOSITION OF SPECIMEN:  N/A  COUNTS:  YES  TOURNIQUET:  * No tourniquets in log *  DICTATION:   The patient was identified & brought into the operating room. He was positioned supine with arms tucked. SCDs were active during the entire case. The patient underwent general anesthesia without any difficulty.  The abdomen was prepped and draped in a sterile fashion. A Surgical Timeout confirmed our plan.  I made a transverse incision through the inferior umbilical fold.  I made a small transverse nick through the anterior rectus fascia contralateral to the inguinal hernia side and placed a 0-vicryl stitch through the fascia.  I placed a Hasson trocar into the preperitoneal plane.  Entry was clean.  We induced carbon dioxide insufflation. Camera inspection revealed no injury.  I used a 10mm angled scope to bluntly free the peritoneum off the infraumbilical anterior abdominal wall.  I created enough of a preperitoneal pocket to place 5mm ports into the right & left mid-abdomen into this preperitoneal cavity.  I started dissection on the right side.  I used blunt & focused sharp dissection to free the peritoneum off the flank and down to the pubic rim.  I freed the anteriolateral bladder wall off the anteriolateral pelvic  wall, sparing midline attachments.    I freed the peritoneum off the spermatic vessels & vas deferens.  I freed peritoneum off the retroperitoneum along the psoas muscle.  There were no breaches in peritoneum that required closure.  I checked & assured hemostasis.   I noted a large direct >> indirect inguinal hernia on the right side.    I did a similar dissection of the left side.  He had a smaller direct hernia there.  He had a small but definite femoral hernia as well.  For each side, I chose a 15x15 cm sheet of ultra-lightweight polypropylene mesh (Ultrapro).  I cut a sigmoid-shaped slit at the side of the mesh.  I placed the mesh into the preperitoneal space & laid it as overlapping mirror-image diamonds such that the inferior point was a 6x6 cm corner flap resting in the true pelvis, covering the obturator & femoral foramina.   The medial cornesr stretched across midline cephalad to the pubic rim.   This provided >2 inch coverage around the hernias.   Because there were bilateral direct defects, the right side large, I did use tacks with a Protack titanium tack along the pubic rim, midline, and superolateral corners.  I held the hernia sacs cephalad & evacuated carbon dioxide.  I closed the fascia with absorbable suture.  I closed the skin using 4-0 monocryl stitch.  Sterile dressings were applied. The patient was extubated & arrived in the PACU in stable condition..  I had discussed postoperative care with the patient in the holding area. I am  about to locate the patient's family and discuss operative findings and postoperative goals / instructions.  Instructions are written in the chart as well.   PLAN OF CARE: Discharge to home after PACU  PATIENT DISPOSITION:  PACU - hemodynamically stable.   Delay start of Pharmacological VTE agent (>24hrs) due to surgical blood loss or risk of bleeding: no

## 2011-07-20 NOTE — Anesthesia Postprocedure Evaluation (Signed)
  Anesthesia Post-op Note  Patient: [name] [name]  Procedure(s) Performed: Procedure(s) (LRB): LAPAROSCOPIC BILATERAL INGUINAL HERNIA REPAIR (Bilateral) INSERTION OF MESH (Bilateral) HERNIA REPAIR FEMORAL (Left)  Patient Location: PACU  Anesthesia Type: General  Level of Consciousness: awake, alert , oriented and patient cooperative  Airway and Oxygen Therapy: Patient Spontanous Breathing and Patient connected to nasal cannula oxygen  Post-op Pain: mild  Post-op Assessment: Post-op Vital signs reviewed, Patient's Cardiovascular Status Stable, Respiratory Function Stable, Patent Airway, No signs of Nausea or vomiting and Pain level controlled  Post-op Vital Signs: stable  Complications: No apparent anesthesia complications

## 2011-07-20 NOTE — ED Notes (Addendum)
Had hernia surgery (Dr. Acquanetta Belling) at 1100 this am, here tonight for urinary retention, last void at ~1045. C/o pain "from surgery". Rates pain at 6/10. Had a "pain pill" (2 at 1900).

## 2011-07-20 NOTE — ED Provider Notes (Signed)
History     CSN: [id number]  Arrival date & time 07/20/11  2022   First MD Initiated Contact with Patient 07/20/11 2150      Chief Complaint  Patient presents with  . Urinary Retention    The history is provided by the patient.  This 77 year old gentleman reports he had a bilateral inguinal hernia repair earlier today. Was discharged from short stay at approximately 2:30 this afternoon. Just prior to discharge patient had an in and out cath performed and was told if he had difficulty urinating at home and had not urinated by 8 PM to return to the emergency department. Patient states he has been unable to urinate more than drops for the time and his lower abdomen has become increasingly uncomfortable. Denies fever or other symptoms.  Past Medical History  Diagnosis Date  . Lung nodule   . IPF (idiopathic pulmonary fibrosis)   . Unspecified essential hypertension   . Other and unspecified hyperlipidemia   . Esophageal reflux   . Bradycardia   . Fluttering heart   . Palpitations   . Cancer     bladder  . Pulmonary fibrosis     Past Surgical History  Procedure Date  . Cystoscopy     removal of bladder tumor  . US echocardiography 07/25/2009    ef 55-60%  . Cardiovascular stress test 07/29/2009    ef 64%  . Hernia repair     Family History  Problem Relation Age of Onset  . Coronary artery disease    . COPD    . Pulmonary fibrosis Brother   . COPD Mother   . Heart failure Father     History  Substance Use Topics  . Smoking status: Never Smoker   . Smokeless tobacco: Not on file  . Alcohol Use: 1.2 oz/week    2 Cans of beer per week      Review of Systems  Constitutional: Negative.   HENT: Negative.   Eyes: Negative.   Respiratory: Negative.   Cardiovascular: Negative.   Gastrointestinal: Negative.   Genitourinary: Negative.   Musculoskeletal: Negative.   Skin: Negative.   Neurological: Negative.   Hematological: Negative.   Psychiatric/Behavioral:  Negative.     Allergies  Review of patient's allergies indicates no known allergies.  Home Medications   Current Outpatient Rx  Name Route Sig Dispense Refill  . ASPIRIN 325 MG PO TABS Oral Take 162.5 mg by mouth daily.    . ATENOLOL 25 MG PO TABS Oral Take 25 mg by mouth daily.    . ATORVASTATIN CALCIUM 80 MG PO TABS Oral Take 80 mg by mouth daily.     . ADULT MULTIVITAMIN W/MINERALS CH Oral Take 1 tablet by mouth daily.    [name] OMEPRAZOLE 20 MG PO CPDR Oral Take 20 mg by mouth daily. 30 minutes before breakfast    . OXYCODONE HCL 5 MG PO TABS Oral Take 1-2 tablets (5-10 mg total) by mouth every 6 (six) hours as needed for pain. 50 tablet 0  . PROMETHAZINE-CODEINE 6.25-10 MG/5ML PO SYRP Oral Take 5 mLs by mouth every 6 (six) hours as needed. For cough    . STUDY MEDICATION Oral Take 3 capsules by mouth 3 (three) times daily. From [employer] for clinical trial on pulmonary fibrosis    . TELMISARTAN-HCTZ 80-25 MG PO TABS Oral Take 1 tablet by mouth daily.      BP 152/78  Pulse 68  Temp 97.9 F (36.6 C)  Resp 20  SpO2 96%  Physical Exam  Constitutional: He is oriented to person, place, and time. He appears well-developed and well-nourished.  HENT:  Head: Normocephalic and atraumatic.  Eyes: Conjunctivae are normal.  Neck: Neck supple.  Cardiovascular: Normal rate and regular rhythm.   Pulmonary/Chest: Effort normal and breath sounds normal.  Abdominal: Soft. Bowel sounds are normal.  Musculoskeletal: Normal range of motion.  Neurological: He is alert and oriented to person, place, and time.  Skin: Skin is warm and dry.  Psychiatric: He has a normal mood and affect.    ED Course  Procedures  Foley catheter has been placed and drained approximately 600 cc of amber colored urine. Patient reports significant relief of suprapubic discomfort after catheter placement. UA negative. Findings and clinical impression discussed with patient. We'll plan for discharge home with Foley  catheter in place and supply patient with a leg bag and instructions for use. Patient is to arrange follow up on Monday it with his primary care physician (Dr Archie Balboa) or with his urologist (Dr Isabel Caprice) for catheter removal. I have discussed patient with Dr. Estell Harpin who is in agreement with plan.  Labs Reviewed  URINALYSIS, ROUTINE W REFLEX MICROSCOPIC - Abnormal; Notable for the following:    APPearance CLOUDY (*)    All other components within normal limits  POCT I-STAT, CHEM 8 - Abnormal; Notable for the following:    Sodium 132 (*)    Creatinine, Ser 1.60 (*)    Glucose, Bld 114 (*)    Calcium, Ion 1.10 (*)    Hemoglobin 12.9 (*)    HCT 38.0 (*)    All other components within normal limits   No results found.   No diagnosis found.    MDM  HPI/PE and clinical findings c/w 1. Urinary retention s/p bil inguinal hernia repair        Leanne Chang, NP 07/20/11 2357

## 2011-07-21 NOTE — Discharge Instructions (Signed)
Please review the instructions below. We have placed a catheter due to your inability to urinate. This catheter will remain in place until Monday or Tuesday. You has been instructed in the use of the leg bag. Use the larger bag at night. Return if you have any worsening symptoms, otherwise arrange follow up with Dr. Isabel Caprice or Dr Docia Chuck for Monday or Tuesday for catheter removal.   Acute Urinary Retention, Male You have been seen by a caregiver today because of your inability to urinate (pass your water). This is a common problem in elderly males. As men age their prostates become larger and block the flow of urine from the bladder. This is usually a problem that has come on gradually. It is often first noticed by having to get up at night to urinate. This is because as the prostate enlarges it is more difficult to empty the bladder completely. Treatment may involve a one time catheterization to empty the bladder. This is putting in a tube to drain your urine. Then you and your personal caregiver can decide at your earliest convenience how to handle this problem in the future. It may also be a problem that may not recur for years. Sometimes this problem can be caused by medications. In this case, all that is often necessary is to discontinue the offending agent. If you are to leave the foley catheter (a long, narrow, hollow tube) in and go home with a drainage system, you will need to discuss the best course of action with your caregiver. While the catheter is in, maintain a good intake of fluids. Keep the drainage bag emptied and lower than your catheter. This is so contaminated (infected) urine will not be flowing back into your bladder. This could lead to a urinary tract infection. Only take over-the-counter or prescription medicines for pain, discomfort, or fever as directed by your caregiver.  SEEK IMMEDIATE MEDICAL CARE IF:  You develop chills, fever, or show signs of generalized illness that occurs  prior to seeing your caregiver. Document Released: 08/13/2000 Document Revised: 01/17/2011 Document Reviewed: 04/28/2008 [facility] Patient Information 2012 [place], [place].

## 2011-07-21 NOTE — ED Notes (Signed)
Leg bag placed on pt and wife instructed on how to care for at home.  Pt and wife voices understanding

## 2011-07-22 NOTE — ED Provider Notes (Signed)
Medical screening examination/treatment/procedure(s) were performed by non-physician practitioner and as supervising physician I was immediately available for consultation/collaboration.   Benny Lennert, MD 07/22/11 [phone]

## 2011-07-23 ENCOUNTER — Encounter (HOSPITAL_COMMUNITY): Payer: Self-pay | Admitting: Surgery

## 2011-07-23 ENCOUNTER — Ambulatory Visit (INDEPENDENT_AMBULATORY_CARE_PROVIDER_SITE_OTHER): Payer: Medicare Other

## 2011-07-23 ENCOUNTER — Telehealth (INDEPENDENT_AMBULATORY_CARE_PROVIDER_SITE_OTHER): Payer: Self-pay | Admitting: General Surgery

## 2011-07-23 DIAGNOSIS — Z9889 Other specified postprocedural states: Secondary | ICD-10-CM

## 2011-07-23 NOTE — Telephone Encounter (Signed)
[name] CALLED TO CHANGE F/U APPT FOR HER HUSBAND [name] [name]. ALSO SHE SAID HE WAS SEEN IN ER OVER WEEKEND AND HAD A  FOLEY CATHETER INSERTED. I WILL CONTACT DR. [name] E CATHETER REMOVAL/GY

## 2011-07-23 NOTE — Progress Notes (Signed)
Pt came in for nurse only to have foley cath removed. The pt tolerated the procedure well with me removing the foley cath. I advised pt to go home and start drinking plenty of liquids. The pt was advised to call me if pt not able to void on their own with 2hrs b/c I then would have to call the [facility]. The pt understands.

## 2011-07-24 ENCOUNTER — Encounter (HOSPITAL_COMMUNITY): Payer: Medicare Other | Attending: Internal Medicine

## 2011-07-24 DIAGNOSIS — K219 Gastro-esophageal reflux disease without esophagitis: Secondary | ICD-10-CM | POA: Insufficient documentation

## 2011-07-24 DIAGNOSIS — R05 Cough: Secondary | ICD-10-CM | POA: Insufficient documentation

## 2011-07-24 DIAGNOSIS — R0609 Other forms of dyspnea: Secondary | ICD-10-CM | POA: Insufficient documentation

## 2011-07-24 DIAGNOSIS — R059 Cough, unspecified: Secondary | ICD-10-CM | POA: Insufficient documentation

## 2011-07-24 DIAGNOSIS — I10 Essential (primary) hypertension: Secondary | ICD-10-CM | POA: Insufficient documentation

## 2011-07-24 DIAGNOSIS — J8410 Pulmonary fibrosis, unspecified: Secondary | ICD-10-CM | POA: Insufficient documentation

## 2011-07-24 DIAGNOSIS — E785 Hyperlipidemia, unspecified: Secondary | ICD-10-CM | POA: Insufficient documentation

## 2011-07-24 DIAGNOSIS — Z5189 Encounter for other specified aftercare: Secondary | ICD-10-CM | POA: Insufficient documentation

## 2011-07-24 DIAGNOSIS — J90 Pleural effusion, not elsewhere classified: Secondary | ICD-10-CM | POA: Insufficient documentation

## 2011-07-24 DIAGNOSIS — R0989 Other specified symptoms and signs involving the circulatory and respiratory systems: Secondary | ICD-10-CM | POA: Insufficient documentation

## 2011-07-26 ENCOUNTER — Encounter (HOSPITAL_COMMUNITY): Admission: RE | Admit: 2011-07-26 | Payer: Medicare Other | Source: Ambulatory Visit

## 2011-07-31 ENCOUNTER — Encounter (HOSPITAL_COMMUNITY): Payer: Medicare Other

## 2011-08-02 ENCOUNTER — Encounter (HOSPITAL_COMMUNITY): Payer: Medicare Other

## 2011-08-07 ENCOUNTER — Encounter (HOSPITAL_COMMUNITY): Payer: Medicare Other

## 2011-08-09 ENCOUNTER — Encounter (HOSPITAL_COMMUNITY): Payer: Medicare Other

## 2011-08-14 ENCOUNTER — Encounter (HOSPITAL_COMMUNITY): Payer: Medicare Other

## 2011-08-16 ENCOUNTER — Encounter (HOSPITAL_COMMUNITY): Payer: Medicare Other

## 2011-08-21 ENCOUNTER — Encounter (HOSPITAL_COMMUNITY): Payer: Medicare Other

## 2011-08-23 ENCOUNTER — Encounter (HOSPITAL_COMMUNITY): Payer: Medicare Other

## 2011-08-27 ENCOUNTER — Encounter (INDEPENDENT_AMBULATORY_CARE_PROVIDER_SITE_OTHER): Payer: Self-pay | Admitting: Surgery

## 2011-08-27 ENCOUNTER — Ambulatory Visit (INDEPENDENT_AMBULATORY_CARE_PROVIDER_SITE_OTHER): Payer: Medicare Other | Admitting: Surgery

## 2011-08-27 VITALS — BP 126/78 | HR 60 | Temp 98.2°F | Resp 16 | Ht 67.0 in | Wt 181.4 lb

## 2011-08-27 DIAGNOSIS — K4020 Bilateral inguinal hernia, without obstruction or gangrene, not specified as recurrent: Secondary | ICD-10-CM

## 2011-08-27 DIAGNOSIS — K4190 Unilateral femoral hernia, without obstruction or gangrene, not specified as recurrent: Secondary | ICD-10-CM

## 2011-08-27 MED ORDER — HYDROCODONE-ACETAMINOPHEN 5-500 MG PO CAPS: 1.0000 | ORAL_CAPSULE | Freq: Four times a day (QID) | ORAL | Status: AC | PRN

## 2011-08-27 MED ORDER — NAPROXEN 500 MG PO TABS: 500.0000 mg | ORAL_TABLET | Freq: Two times a day (BID) | ORAL | Status: AC

## 2011-08-27 MED ORDER — PROMETHAZINE HCL 12.5 MG PO TABS: 12.5000 mg | ORAL_TABLET | Freq: Three times a day (TID) | ORAL | Status: DC | PRN

## 2011-08-27 MED ORDER — PROMETHAZINE HCL 12.5 MG PO TABS: 12.5000 mg | ORAL_TABLET | Freq: Four times a day (QID) | ORAL | Status: DC | PRN

## 2011-08-27 NOTE — Progress Notes (Signed)
Subjective:     Patient ID: [name] [name], male   DOB: September 06, 1934, 77 y.o.   MRN: [id number]  HPI  [name] [name]  25-Apr-1935 [id number]  Patient Care Team: Darrow Bussing, MD as PCP - General (Family Medicine) Waymon Budge, MD as Consulting Physician (Pulmonary Disease)  This patient is a 77 y.o.male who presents today for surgical evaluation.   Procedure: Laparoscopic bilateral inguinal and left femoral hernia repairs 07/20/2011  Patient comes in today feeling rather sore. Did not like oxycodone so stopped it. Did not call for a different medication. Weaned off medicines. Taking ibuprofen one pill 2-3 times a day. Still having lower abdominal soreness. Still with some nausea and bloating. Having daily bowel movements. No fevers or chills. Bruising gone down.  Patient Active Problem List  Diagnoses  . HYPERLIPIDEMIA  . HYPERTENSION  . BRONCHIECTASIS  . PULMONARY FIBROSIS  . PULMONARY NODULE, RIGHT MIDDLE LOBE  . G E R D  . DYSPNEA  . BRONCHITIS, ACUTE  . Bilateral inguinal herniae (BIH), R>L  . Femoral hernia, left    Past Medical History  Diagnosis Date  . Lung nodule   . IPF (idiopathic pulmonary fibrosis)   . Unspecified essential hypertension   . Other and unspecified hyperlipidemia   . Esophageal reflux   . Bradycardia   . Fluttering heart   . Palpitations   . Cancer     bladder  . Pulmonary fibrosis     Past Surgical History  Procedure Date  . Cystoscopy     removal of bladder tumor  . US echocardiography 07/25/2009    ef 55-60%  . Cardiovascular stress test 07/29/2009    ef 64%  . Inguinal hernia repair 07/20/2011    Procedure: LAPAROSCOPIC BILATERAL INGUINAL HERNIA REPAIR;  Surgeon: Ardeth Sportsman, MD;  Location: [facility] OR;  Service: General;  Laterality: Bilateral;  . Femoral hernia repair 07/20/2011    Procedure: HERNIA REPAIR FEMORAL;  Surgeon: Ardeth Sportsman, MD;  Location: [facility] OR;  Service: General;  Laterality: Left;    History   Social  History  . Marital Status: Married    Spouse Name: N/A    Number of Children: N/A  . Years of Education: N/A   Occupational History  . Retired    Social History Main Topics  . Smoking status: Never Smoker   . Smokeless tobacco: Not on file  . Alcohol Use: 1.2 oz/week    2 Cans of beer per week  . Drug Use: No  . Sexually Active: Not on file   Other Topics Concern  . Not on file   Social History Narrative  . No narrative on file    Family History  Problem Relation Age of Onset  . Coronary artery disease    . COPD    . Pulmonary fibrosis Brother   . COPD Mother   . Heart failure Father     Current Outpatient Prescriptions  Medication Sig Dispense Refill  . aspirin 325 MG tablet Take 162.5 mg by mouth daily.      [name] atenolol (TENORMIN) 25 MG tablet Take 25 mg by mouth daily.      [name] atorvastatin (LIPITOR) 80 MG tablet Take 80 mg by mouth daily.       . benzonatate (TESSALON) 100 MG capsule Ad lib.      [name] ibuprofen (ADVIL,MOTRIN) 200 MG tablet Take 200 mg by mouth every 6 (six) hours as needed.      . Multiple Vitamin (  MULITIVITAMIN WITH MINERALS) TABS Take 1 tablet by mouth daily.      [name] omeprazole (PRILOSEC) 20 MG capsule Take 20 mg by mouth daily. 30 minutes before breakfast      . promethazine-codeine (PHENERGAN WITH CODEINE) 6.25-10 MG/5ML syrup Take 5 mLs by mouth every 6 (six) hours as needed. For cough      . STUDY MEDICATION Take 3 capsules by mouth 3 (three) times daily. From [facility] for clinical trial on pulmonary fibrosis      . telmisartan-hydrochlorothiazide (MICARDIS HCT) 80-25 MG per tablet Take 1 tablet by mouth daily.         No Known Allergies  BP 126/78  Pulse 60  Temp(Src) 98.2 F (36.8 C) (Temporal)  Resp 16  Ht 5\' 7"  (1.702 m)  Wt 181 lb 6.4 oz (82.283 kg)  BMI 28.41 kg/m2     Review of Systems  Constitutional: Negative for fever, chills and diaphoresis.  HENT: Negative for sore throat, trouble swallowing and neck pain.   Eyes:  Negative for photophobia and visual disturbance.  Respiratory: Negative for choking and shortness of breath.   Cardiovascular: Negative for chest pain and palpitations.  Gastrointestinal: Negative for nausea, vomiting, abdominal distention, anal bleeding and rectal pain.  Genitourinary: Negative for dysuria, urgency, difficulty urinating and testicular pain.  Musculoskeletal: Negative for myalgias, arthralgias and gait problem.  Skin: Negative for color change and rash.  Neurological: Negative for dizziness, speech difficulty, weakness and numbness.  Hematological: Negative for adenopathy.  Psychiatric/Behavioral: Negative for hallucinations, confusion and agitation.       Objective:   Physical Exam  Constitutional: He is oriented to person, place, and time. He appears well-developed and well-nourished. No distress.  HENT:  Head: Normocephalic.  Mouth/Throat: Oropharynx is clear and moist. No oropharyngeal exudate.  Eyes: Conjunctivae and EOM are normal. Pupils are equal, round, and reactive to light. No scleral icterus.  Neck: Normal range of motion. No tracheal deviation present.  Cardiovascular: Normal rate, normal heart sounds and intact distal pulses.   Pulmonary/Chest: Effort normal. No respiratory distress.  Abdominal: Soft. He exhibits no distension. There is no tenderness. Hernia confirmed negative in the right inguinal area and confirmed negative in the left inguinal area.       Incisions clean with normal healing ridges.  No hernias  3x2cm right groin mass c/w seroma  Musculoskeletal: Normal range of motion. He exhibits no tenderness.  Neurological: He is alert and oriented to person, place, and time. No cranial nerve deficit. He exhibits normal muscle tone. Coordination normal.  Skin: Skin is warm and dry. No rash noted. He is not diaphoretic.  Psychiatric: He has a normal mood and affect. His behavior is normal.       Assessment:     5 weeks s/p lap BIH & LFHernia  repairs, slowly recovering    Plan:     Increase activity as tolerated.  Do not push through pain.    Switch & Improve pain control.  Naproxen BID, Hydrocodone.  Use phenergan PRN for nausea.  CALL IF THIS DOES NOT HELP!  I noted that he has the right to better pain control  Advanced on diet as tolerated. Bowel regimen to avoid problems.  Return to clinic 3 weeks to make sure that he is improved. The patient expressed understanding and appreciation

## 2011-08-27 NOTE — Patient Instructions (Signed)
Managing Pain  Pain after surgery or related to activity is often due to strain/injury to muscle, tendon, nerves and/or incisions.  This pain is usually short-term and will improve in a few months.   Many people find it helpful to do the following things TOGETHER to help speed the process of healing and to get back to regular activity more quickly:  1. Avoid heavy physical activity a.  no lifting greater than 20 pounds b. Do not "push through" the pain.  Listen to your body and avoid positions and maneuvers than reproduce the pain c. Walking is okay as tolerated, but go slowly and stop when getting sore.  d. Remember: If it hurts to do it, then don't do it! 2. Take Anti-inflammatory medication  a. Take with food/snack around the clock for 1-2 weeks i. This helps the muscle and nerve tissues become less irritable and calm down faster b. Choose ONE of the following over-the-counter medications: i. Naproxen 500mg  tab twice a day  ii. Ibuprofen 200mg  tabs (ex. Advil, Motrin) 3-4 pills with every meal and just before bedtime iii. Acetaminophen 500mg  tabs (Tylenol) 1-2 pills with every meal and just before bedtime 3. Use a Heating pad or Ice/Cold Pack a. 4-6 times a day b. May use warm bath/hottub  or showers 4. Try Gentle Massage and/or Stretching  a. at the area of pain many times a day b. stop if you feel pain - do not overdo it  Try these steps together to help you body heal faster and avoid making things get worse.  Doing just one of these things may not be enough.    If you are not getting better after two weeks or are noticing you are getting worse, contact our office for further advice; we may need to re-evaluate you & see what other things we can do to help.

## 2011-08-28 ENCOUNTER — Encounter (HOSPITAL_COMMUNITY): Payer: Medicare Other

## 2011-08-29 ENCOUNTER — Telehealth (INDEPENDENT_AMBULATORY_CARE_PROVIDER_SITE_OTHER): Payer: Self-pay

## 2011-08-29 NOTE — Telephone Encounter (Signed)
Called pt to check on him after DR Gross gave him some other pain med. On Monday. The pt is doing better now with the different pain med. The pt stated he feel he is improving now. I advised the pt to call if any problems.

## 2011-08-30 ENCOUNTER — Encounter (HOSPITAL_COMMUNITY): Payer: Medicare Other

## 2011-09-04 ENCOUNTER — Encounter (HOSPITAL_COMMUNITY): Payer: Medicare Other

## 2011-09-06 ENCOUNTER — Encounter (HOSPITAL_COMMUNITY): Payer: Medicare Other

## 2011-09-11 ENCOUNTER — Encounter (HOSPITAL_COMMUNITY): Payer: Medicare Other

## 2011-09-13 ENCOUNTER — Encounter (HOSPITAL_COMMUNITY): Payer: Medicare Other

## 2011-09-18 ENCOUNTER — Encounter (HOSPITAL_COMMUNITY): Payer: Medicare Other

## 2011-09-20 ENCOUNTER — Encounter (HOSPITAL_COMMUNITY): Payer: Medicare Other

## 2011-09-25 ENCOUNTER — Other Ambulatory Visit: Payer: Self-pay | Admitting: Dermatology

## 2011-09-25 ENCOUNTER — Encounter (HOSPITAL_COMMUNITY): Payer: Medicare Other

## 2011-09-25 DIAGNOSIS — E785 Hyperlipidemia, unspecified: Secondary | ICD-10-CM | POA: Diagnosis not present

## 2011-09-25 DIAGNOSIS — D0420 Carcinoma in situ of skin of unspecified ear and external auricular canal: Secondary | ICD-10-CM | POA: Diagnosis not present

## 2011-09-25 DIAGNOSIS — I10 Essential (primary) hypertension: Secondary | ICD-10-CM | POA: Diagnosis not present

## 2011-09-25 DIAGNOSIS — L570 Actinic keratosis: Secondary | ICD-10-CM | POA: Diagnosis not present

## 2011-09-25 DIAGNOSIS — D485 Neoplasm of uncertain behavior of skin: Secondary | ICD-10-CM | POA: Diagnosis not present

## 2011-09-26 ENCOUNTER — Encounter (INDEPENDENT_AMBULATORY_CARE_PROVIDER_SITE_OTHER): Payer: Medicare Other | Admitting: Surgery

## 2011-09-27 ENCOUNTER — Encounter (HOSPITAL_COMMUNITY): Payer: Medicare Other

## 2011-10-02 ENCOUNTER — Encounter (HOSPITAL_COMMUNITY): Payer: Medicare Other

## 2011-10-04 ENCOUNTER — Encounter (HOSPITAL_COMMUNITY): Payer: Medicare Other

## 2011-10-29 ENCOUNTER — Encounter: Payer: Self-pay | Admitting: Internal Medicine

## 2011-10-29 ENCOUNTER — Ambulatory Visit (INDEPENDENT_AMBULATORY_CARE_PROVIDER_SITE_OTHER): Payer: Medicare Other | Admitting: Internal Medicine

## 2011-10-29 VITALS — BP 104/62 | HR 59 | Ht 67.0 in | Wt 183.0 lb

## 2011-10-29 DIAGNOSIS — J8410 Pulmonary fibrosis, unspecified: Secondary | ICD-10-CM

## 2011-10-29 DIAGNOSIS — J479 Bronchiectasis, uncomplicated: Secondary | ICD-10-CM | POA: Diagnosis not present

## 2011-10-29 DIAGNOSIS — J984 Other disorders of lung: Secondary | ICD-10-CM | POA: Diagnosis not present

## 2011-10-29 MED ORDER — TRAMADOL HCL 50 MG PO TABS: ORAL_TABLET | ORAL | Status: AC

## 2011-10-29 MED ORDER — PROMETHAZINE-CODEINE 6.25-10 MG/5ML PO SYRP: 5.0000 mL | ORAL_SOLUTION | Freq: Four times a day (QID) | ORAL | Status: DC | PRN

## 2011-10-29 NOTE — Progress Notes (Signed)
Patient ID: [name] [name], male    DOB: [birth year], 77 y.o.   MRN: [id number]  HPI 10/30/10- 42 yoM never smoker, followed for pulmonary fibrosis and RML nodule, complicated by bronchiectasis, GERD and HBP. Last here June 08, 2010- note reviewed.  He continues Pulmonary Rehab. He finished clinical trial at [facility] and has now started perfenadone/ Asend trial at [facility]. With these he is getting PFTs, and xrays at [facility].  He notes that dyspnea is stable- doe on stairs, able to play golf twice weekly. Dry cough is gradually worse. Codeine cough syrup and benzonatate help.   05/01/11- 10/30/10- 75 yoM never smoker, followed for pulmonary fibrosis and RML nodule, complicated by bronchiectasis, GERD and HBP Has had flu vaccine. Still participating in pulmonary rehabilitation twice a week as he continues with a research drug protocol at [facility]. He had a negative test for reflux recently. He describes a protracted "cold" since November 1. He may have a little fever and discolored sputum at the beginning. We had sent a Z-Pak and he is now taking an Alka-Seltzer product. Over the last 6 months he has felt about the same. He notices chronic cough with deep breath but no progressive shortness of breath. He says he looked stable on a 6 minute walk test and PFT at [facility]. Spiriva did not help- it is no longer being used.  10/29/11-  75 yoM never smoker, followed for pulmonary fibrosis and RML nodule, complicated by bronchiectasis, GERD and HBP  Patient c/o "slight" sob, and cough. Denies wheezing. He finishes his second drug trial for interstitial lung disease at [facility] and is now getting "the real by mouth". The protocol includes chest x-ray, PFT and 6 minute walk test being done there so we will not repeat them. Subjectively, and from what he is told at [facility], he is basically stable with persistent cough. Cough wakes him early in the mornings . He continues to play golf regularly.  In March he had double hernia  repair. As he recovers, he will resume pulmonary rehabilitation here in July.  Review of Systems- see HPI Constitutional:   No-   weight loss, night sweats, fevers, chills, fatigue, lassitude. HEENT:   No-  headaches, difficulty swallowing, tooth/dental problems, sore throat,       No-  sneezing, itching, ear ache, nasal congestion, post nasal drip,  CV:  No-   chest pain, orthopnea, PND, swelling in lower extremities, anasarca, dizziness, palpitations Resp: +  shortness of breath with exertion or at rest.              No-   productive cough,  +non-productive cough,  No- coughing up of blood.              No-   change in color of mucus.  No- wheezing.   Skin: No-   rash or lesions. GI:  No-   heartburn, indigestion, abdominal pain, +nausea, vomiting, GU: ain. MS:  No-   joint pain or swelling.   Neuro-     nothing unusual Psych:  No- change in mood or affect. No depression or anxiety.  No memory loss.   Objective:   Physical Exam General- Alert, Oriented, Affect-appropriate, Distress- none acute Skin- rash-none, lesions- none, excoriation- none Lymphadenopathy- none Head- atraumatic            Eyes- Gross vision intact, PERRLA, conjunctivae clear secretions            Ears- Hearing, canals-normal  Nose- Clear, no-Septal dev, mucus, polyps, erosion, perforation             Throat- Mallampati II , mucosa clear , drainage- none, tonsils- atrophic Neck- flexible , trachea midline, no stridor , thyroid nl, carotid no bruit Chest - symmetrical excursion , unlabored           Heart/CV- RRR , no murmur , no gallop  , no rub, nl s1 s2                           - JVD- none , edema- none, stasis changes- none, varices- none           Lung- diffuse crackles, wheeze- none, +mild dry  cough , dullness-none, rub- none           Chest wall-  Abd- tender-no, distended-no, bowel sounds-present, HSM- no Br/ Gen/ Rectal- Not done, not indicated Extrem- cyanosis- none, clubbing- none,  atrophy- none, strength- nl Neuro- grossly intact to observation

## 2011-11-03 NOTE — Assessment & Plan Note (Signed)
His dominant lung pathology now is the interstitial fibrotic lung disease.

## 2011-11-03 NOTE — Assessment & Plan Note (Signed)
This is 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 It is progressing very slowly if at all based on what I understand from the status of his [facility] from trials and associated surveillance. He remains able to play golf. We discussed ways to manage his dry cough. I am refilling his codeine cough syrup but I will let him try tramadol.

## 2011-11-03 NOTE — Assessment & Plan Note (Signed)
He is getting his chest x-ray surveillance at [facility].

## 2011-11-03 NOTE — Patient Instructions (Signed)
Try tramadol for cough  Please call as needed

## 2011-11-20 DIAGNOSIS — J84112 Idiopathic pulmonary fibrosis: Secondary | ICD-10-CM | POA: Diagnosis not present

## 2011-11-20 DIAGNOSIS — R05 Cough: Secondary | ICD-10-CM | POA: Diagnosis not present

## 2011-11-20 DIAGNOSIS — Z79899 Other long term (current) drug therapy: Secondary | ICD-10-CM | POA: Diagnosis not present

## 2011-11-27 ENCOUNTER — Encounter (HOSPITAL_COMMUNITY): Payer: Self-pay

## 2011-11-27 DIAGNOSIS — J8410 Pulmonary fibrosis, unspecified: Secondary | ICD-10-CM | POA: Insufficient documentation

## 2011-11-27 DIAGNOSIS — R0989 Other specified symptoms and signs involving the circulatory and respiratory systems: Secondary | ICD-10-CM | POA: Insufficient documentation

## 2011-11-27 DIAGNOSIS — R05 Cough: Secondary | ICD-10-CM | POA: Insufficient documentation

## 2011-11-27 DIAGNOSIS — R059 Cough, unspecified: Secondary | ICD-10-CM | POA: Insufficient documentation

## 2011-11-27 DIAGNOSIS — J90 Pleural effusion, not elsewhere classified: Secondary | ICD-10-CM | POA: Insufficient documentation

## 2011-11-27 DIAGNOSIS — Z5189 Encounter for other specified aftercare: Secondary | ICD-10-CM | POA: Insufficient documentation

## 2011-11-27 DIAGNOSIS — R0609 Other forms of dyspnea: Secondary | ICD-10-CM | POA: Insufficient documentation

## 2011-11-27 DIAGNOSIS — K219 Gastro-esophageal reflux disease without esophagitis: Secondary | ICD-10-CM | POA: Insufficient documentation

## 2011-11-27 DIAGNOSIS — E785 Hyperlipidemia, unspecified: Secondary | ICD-10-CM | POA: Insufficient documentation

## 2011-11-27 DIAGNOSIS — I10 Essential (primary) hypertension: Secondary | ICD-10-CM | POA: Insufficient documentation

## 2011-11-29 ENCOUNTER — Encounter (HOSPITAL_COMMUNITY)
Admission: RE | Admit: 2011-11-29 | Discharge: 2011-11-29 | Disposition: A | Discharge status: Home / Self Care | Payer: Self-pay | Source: Ambulatory Visit | Attending: Internal Medicine | Admitting: Internal Medicine

## 2011-11-29 NOTE — Progress Notes (Signed)
Pulmonary Rehab Maintenance Program  Pt first day Pulmonary Rehab Maintenance Program, (pt has been in Pulmonary Rehab Maintenance Program before) tolerated well. Vital signs stable 02 SATS 97%. PLB encouraged/demonstated. Will continue to encourage and support.

## 2011-12-04 ENCOUNTER — Encounter (HOSPITAL_COMMUNITY)
Admission: RE | Admit: 2011-12-04 | Discharge: 2011-12-04 | Disposition: A | Discharge status: Home / Self Care | Payer: Self-pay | Source: Ambulatory Visit | Attending: Internal Medicine | Admitting: Internal Medicine

## 2011-12-06 ENCOUNTER — Encounter (HOSPITAL_COMMUNITY)
Admission: RE | Admit: 2011-12-06 | Discharge: 2011-12-06 | Disposition: A | Discharge status: Home / Self Care | Payer: Self-pay | Source: Ambulatory Visit | Attending: Internal Medicine | Admitting: Internal Medicine

## 2011-12-10 ENCOUNTER — Other Ambulatory Visit: Payer: Self-pay | Admitting: Internal Medicine

## 2011-12-10 DIAGNOSIS — J8410 Pulmonary fibrosis, unspecified: Secondary | ICD-10-CM

## 2011-12-11 ENCOUNTER — Encounter (HOSPITAL_COMMUNITY)
Admission: RE | Admit: 2011-12-11 | Discharge: 2011-12-11 | Disposition: A | Discharge status: Home / Self Care | Payer: Self-pay | Source: Ambulatory Visit | Attending: Internal Medicine | Admitting: Internal Medicine

## 2011-12-13 ENCOUNTER — Encounter (HOSPITAL_COMMUNITY)
Admission: RE | Admit: 2011-12-13 | Discharge: 2011-12-13 | Disposition: A | Discharge status: Home / Self Care | Payer: Self-pay | Source: Ambulatory Visit | Attending: Internal Medicine | Admitting: Internal Medicine

## 2011-12-18 ENCOUNTER — Encounter (HOSPITAL_COMMUNITY)
Admission: RE | Admit: 2011-12-18 | Discharge: 2011-12-18 | Disposition: A | Discharge status: Home / Self Care | Payer: Self-pay | Source: Ambulatory Visit | Attending: Internal Medicine | Admitting: Internal Medicine

## 2011-12-20 ENCOUNTER — Encounter (HOSPITAL_COMMUNITY)
Admission: RE | Admit: 2011-12-20 | Discharge: 2011-12-20 | Disposition: A | Discharge status: Home / Self Care | Payer: Self-pay | Source: Ambulatory Visit | Attending: Internal Medicine | Admitting: Internal Medicine

## 2011-12-20 DIAGNOSIS — Z5189 Encounter for other specified aftercare: Secondary | ICD-10-CM | POA: Insufficient documentation

## 2011-12-20 DIAGNOSIS — I10 Essential (primary) hypertension: Secondary | ICD-10-CM | POA: Insufficient documentation

## 2011-12-20 DIAGNOSIS — R0989 Other specified symptoms and signs involving the circulatory and respiratory systems: Secondary | ICD-10-CM | POA: Insufficient documentation

## 2011-12-20 DIAGNOSIS — E785 Hyperlipidemia, unspecified: Secondary | ICD-10-CM | POA: Insufficient documentation

## 2011-12-20 DIAGNOSIS — J8410 Pulmonary fibrosis, unspecified: Secondary | ICD-10-CM | POA: Insufficient documentation

## 2011-12-20 DIAGNOSIS — R0609 Other forms of dyspnea: Secondary | ICD-10-CM | POA: Insufficient documentation

## 2011-12-20 DIAGNOSIS — R05 Cough: Secondary | ICD-10-CM | POA: Insufficient documentation

## 2011-12-20 DIAGNOSIS — R059 Cough, unspecified: Secondary | ICD-10-CM | POA: Insufficient documentation

## 2011-12-20 DIAGNOSIS — J90 Pleural effusion, not elsewhere classified: Secondary | ICD-10-CM | POA: Insufficient documentation

## 2011-12-20 DIAGNOSIS — K219 Gastro-esophageal reflux disease without esophagitis: Secondary | ICD-10-CM | POA: Insufficient documentation

## 2011-12-25 ENCOUNTER — Encounter (HOSPITAL_COMMUNITY): Payer: Self-pay

## 2011-12-27 ENCOUNTER — Encounter (HOSPITAL_COMMUNITY)
Admission: RE | Admit: 2011-12-27 | Discharge: 2011-12-27 | Disposition: A | Discharge status: Home / Self Care | Payer: Self-pay | Source: Ambulatory Visit | Attending: Internal Medicine | Admitting: Internal Medicine

## 2012-01-01 ENCOUNTER — Encounter (HOSPITAL_COMMUNITY)
Admission: RE | Admit: 2012-01-01 | Discharge: 2012-01-01 | Disposition: A | Discharge status: Home / Self Care | Payer: Self-pay | Source: Ambulatory Visit | Attending: Internal Medicine | Admitting: Internal Medicine

## 2012-01-03 ENCOUNTER — Encounter (HOSPITAL_COMMUNITY)
Admission: RE | Admit: 2012-01-03 | Discharge: 2012-01-03 | Disposition: A | Discharge status: Home / Self Care | Payer: Self-pay | Source: Ambulatory Visit | Attending: Internal Medicine | Admitting: Internal Medicine

## 2012-01-04 DIAGNOSIS — J84112 Idiopathic pulmonary fibrosis: Secondary | ICD-10-CM | POA: Diagnosis not present

## 2012-01-08 ENCOUNTER — Encounter (HOSPITAL_COMMUNITY)
Admission: RE | Admit: 2012-01-08 | Discharge: 2012-01-08 | Disposition: A | Discharge status: Home / Self Care | Payer: Self-pay | Source: Ambulatory Visit | Attending: Internal Medicine | Admitting: Internal Medicine

## 2012-01-10 ENCOUNTER — Encounter (HOSPITAL_COMMUNITY)
Admission: RE | Admit: 2012-01-10 | Discharge: 2012-01-10 | Disposition: A | Discharge status: Home / Self Care | Payer: Self-pay | Source: Ambulatory Visit | Attending: Internal Medicine | Admitting: Internal Medicine

## 2012-01-15 ENCOUNTER — Encounter (HOSPITAL_COMMUNITY)
Admission: RE | Admit: 2012-01-15 | Discharge: 2012-01-15 | Disposition: A | Discharge status: Home / Self Care | Payer: Self-pay | Source: Ambulatory Visit | Attending: Internal Medicine | Admitting: Internal Medicine

## 2012-01-17 ENCOUNTER — Encounter (HOSPITAL_COMMUNITY): Payer: Self-pay

## 2012-01-17 DIAGNOSIS — N401 Enlarged prostate with lower urinary tract symptoms: Secondary | ICD-10-CM | POA: Diagnosis not present

## 2012-01-17 DIAGNOSIS — C679 Malignant neoplasm of bladder, unspecified: Secondary | ICD-10-CM | POA: Diagnosis not present

## 2012-01-22 ENCOUNTER — Encounter (HOSPITAL_COMMUNITY)
Admission: RE | Admit: 2012-01-22 | Discharge: 2012-01-22 | Disposition: A | Discharge status: Home / Self Care | Payer: Self-pay | Source: Ambulatory Visit | Attending: Internal Medicine | Admitting: Internal Medicine

## 2012-01-22 DIAGNOSIS — R059 Cough, unspecified: Secondary | ICD-10-CM | POA: Insufficient documentation

## 2012-01-22 DIAGNOSIS — J90 Pleural effusion, not elsewhere classified: Secondary | ICD-10-CM | POA: Insufficient documentation

## 2012-01-22 DIAGNOSIS — R0609 Other forms of dyspnea: Secondary | ICD-10-CM | POA: Insufficient documentation

## 2012-01-22 DIAGNOSIS — I10 Essential (primary) hypertension: Secondary | ICD-10-CM | POA: Insufficient documentation

## 2012-01-22 DIAGNOSIS — Z5189 Encounter for other specified aftercare: Secondary | ICD-10-CM | POA: Insufficient documentation

## 2012-01-22 DIAGNOSIS — R05 Cough: Secondary | ICD-10-CM | POA: Insufficient documentation

## 2012-01-22 DIAGNOSIS — K219 Gastro-esophageal reflux disease without esophagitis: Secondary | ICD-10-CM | POA: Insufficient documentation

## 2012-01-22 DIAGNOSIS — J8410 Pulmonary fibrosis, unspecified: Secondary | ICD-10-CM | POA: Insufficient documentation

## 2012-01-22 DIAGNOSIS — R0989 Other specified symptoms and signs involving the circulatory and respiratory systems: Secondary | ICD-10-CM | POA: Insufficient documentation

## 2012-01-22 DIAGNOSIS — E785 Hyperlipidemia, unspecified: Secondary | ICD-10-CM | POA: Insufficient documentation

## 2012-01-24 ENCOUNTER — Encounter (HOSPITAL_COMMUNITY)
Admission: RE | Admit: 2012-01-24 | Discharge: 2012-01-24 | Disposition: A | Discharge status: Home / Self Care | Payer: Self-pay | Source: Ambulatory Visit | Attending: Internal Medicine | Admitting: Internal Medicine

## 2012-01-29 ENCOUNTER — Encounter (HOSPITAL_COMMUNITY): Payer: Self-pay

## 2012-01-31 ENCOUNTER — Encounter (HOSPITAL_COMMUNITY): Payer: Self-pay

## 2012-02-05 ENCOUNTER — Encounter (HOSPITAL_COMMUNITY): Payer: Self-pay

## 2012-02-07 ENCOUNTER — Encounter (HOSPITAL_COMMUNITY): Payer: Self-pay

## 2012-02-12 ENCOUNTER — Encounter (HOSPITAL_COMMUNITY)
Admission: RE | Admit: 2012-02-12 | Discharge: 2012-02-12 | Disposition: A | Discharge status: Home / Self Care | Payer: Self-pay | Source: Ambulatory Visit | Attending: Internal Medicine | Admitting: Internal Medicine

## 2012-02-14 ENCOUNTER — Encounter (HOSPITAL_COMMUNITY)
Admission: RE | Admit: 2012-02-14 | Discharge: 2012-02-14 | Disposition: A | Discharge status: Home / Self Care | Payer: Self-pay | Source: Ambulatory Visit | Attending: Internal Medicine | Admitting: Internal Medicine

## 2012-02-19 ENCOUNTER — Encounter (HOSPITAL_COMMUNITY)
Admission: RE | Admit: 2012-02-19 | Discharge: 2012-02-19 | Disposition: A | Discharge status: Home / Self Care | Payer: Self-pay | Source: Ambulatory Visit | Attending: Internal Medicine | Admitting: Internal Medicine

## 2012-02-19 DIAGNOSIS — J90 Pleural effusion, not elsewhere classified: Secondary | ICD-10-CM | POA: Insufficient documentation

## 2012-02-19 DIAGNOSIS — R0989 Other specified symptoms and signs involving the circulatory and respiratory systems: Secondary | ICD-10-CM | POA: Insufficient documentation

## 2012-02-19 DIAGNOSIS — R059 Cough, unspecified: Secondary | ICD-10-CM | POA: Insufficient documentation

## 2012-02-19 DIAGNOSIS — E785 Hyperlipidemia, unspecified: Secondary | ICD-10-CM | POA: Insufficient documentation

## 2012-02-19 DIAGNOSIS — I10 Essential (primary) hypertension: Secondary | ICD-10-CM | POA: Insufficient documentation

## 2012-02-19 DIAGNOSIS — K219 Gastro-esophageal reflux disease without esophagitis: Secondary | ICD-10-CM | POA: Insufficient documentation

## 2012-02-19 DIAGNOSIS — R0609 Other forms of dyspnea: Secondary | ICD-10-CM | POA: Insufficient documentation

## 2012-02-19 DIAGNOSIS — R05 Cough: Secondary | ICD-10-CM | POA: Insufficient documentation

## 2012-02-19 DIAGNOSIS — Z5189 Encounter for other specified aftercare: Secondary | ICD-10-CM | POA: Insufficient documentation

## 2012-02-19 DIAGNOSIS — J8410 Pulmonary fibrosis, unspecified: Secondary | ICD-10-CM | POA: Insufficient documentation

## 2012-02-20 DIAGNOSIS — Z23 Encounter for immunization: Secondary | ICD-10-CM | POA: Diagnosis not present

## 2012-02-21 ENCOUNTER — Encounter (HOSPITAL_COMMUNITY)
Admission: RE | Admit: 2012-02-21 | Discharge: 2012-02-21 | Disposition: A | Discharge status: Home / Self Care | Payer: Self-pay | Source: Ambulatory Visit | Attending: Internal Medicine | Admitting: Internal Medicine

## 2012-02-26 ENCOUNTER — Encounter (HOSPITAL_COMMUNITY)
Admission: RE | Admit: 2012-02-26 | Discharge: 2012-02-26 | Disposition: A | Discharge status: Home / Self Care | Payer: Self-pay | Source: Ambulatory Visit | Attending: Internal Medicine | Admitting: Internal Medicine

## 2012-02-28 ENCOUNTER — Encounter (HOSPITAL_COMMUNITY)
Admission: RE | Admit: 2012-02-28 | Discharge: 2012-02-28 | Disposition: A | Discharge status: Home / Self Care | Payer: Self-pay | Source: Ambulatory Visit | Attending: Internal Medicine | Admitting: Internal Medicine

## 2012-03-04 ENCOUNTER — Encounter (HOSPITAL_COMMUNITY)
Admission: RE | Admit: 2012-03-04 | Discharge: 2012-03-04 | Disposition: A | Discharge status: Home / Self Care | Payer: Self-pay | Source: Ambulatory Visit | Attending: Internal Medicine | Admitting: Internal Medicine

## 2012-03-06 ENCOUNTER — Encounter (HOSPITAL_COMMUNITY)
Admission: RE | Admit: 2012-03-06 | Discharge: 2012-03-06 | Disposition: A | Discharge status: Home / Self Care | Payer: Self-pay | Source: Ambulatory Visit | Attending: Internal Medicine | Admitting: Internal Medicine

## 2012-03-11 ENCOUNTER — Encounter (HOSPITAL_COMMUNITY)
Admission: RE | Admit: 2012-03-11 | Discharge: 2012-03-11 | Disposition: A | Discharge status: Home / Self Care | Payer: Self-pay | Source: Ambulatory Visit | Attending: Internal Medicine | Admitting: Internal Medicine

## 2012-03-13 ENCOUNTER — Encounter (HOSPITAL_COMMUNITY)
Admission: RE | Admit: 2012-03-13 | Discharge: 2012-03-13 | Disposition: A | Discharge status: Home / Self Care | Payer: Self-pay | Source: Ambulatory Visit | Attending: Internal Medicine | Admitting: Internal Medicine

## 2012-03-18 ENCOUNTER — Encounter (HOSPITAL_COMMUNITY)
Admission: RE | Admit: 2012-03-18 | Discharge: 2012-03-18 | Disposition: A | Discharge status: Home / Self Care | Payer: Self-pay | Source: Ambulatory Visit | Attending: Internal Medicine | Admitting: Internal Medicine

## 2012-03-20 ENCOUNTER — Encounter (HOSPITAL_COMMUNITY)
Admission: RE | Admit: 2012-03-20 | Discharge: 2012-03-20 | Disposition: A | Discharge status: Home / Self Care | Payer: Self-pay | Source: Ambulatory Visit | Attending: Internal Medicine | Admitting: Internal Medicine

## 2012-03-25 ENCOUNTER — Encounter (HOSPITAL_COMMUNITY)
Admission: RE | Admit: 2012-03-25 | Discharge: 2012-03-25 | Disposition: A | Discharge status: Home / Self Care | Payer: Self-pay | Source: Ambulatory Visit | Attending: Internal Medicine | Admitting: Internal Medicine

## 2012-03-25 DIAGNOSIS — R05 Cough: Secondary | ICD-10-CM | POA: Insufficient documentation

## 2012-03-25 DIAGNOSIS — I10 Essential (primary) hypertension: Secondary | ICD-10-CM | POA: Insufficient documentation

## 2012-03-25 DIAGNOSIS — J90 Pleural effusion, not elsewhere classified: Secondary | ICD-10-CM | POA: Insufficient documentation

## 2012-03-25 DIAGNOSIS — Z5189 Encounter for other specified aftercare: Secondary | ICD-10-CM | POA: Insufficient documentation

## 2012-03-25 DIAGNOSIS — K219 Gastro-esophageal reflux disease without esophagitis: Secondary | ICD-10-CM | POA: Insufficient documentation

## 2012-03-25 DIAGNOSIS — J8410 Pulmonary fibrosis, unspecified: Secondary | ICD-10-CM | POA: Insufficient documentation

## 2012-03-25 DIAGNOSIS — R059 Cough, unspecified: Secondary | ICD-10-CM | POA: Insufficient documentation

## 2012-03-25 DIAGNOSIS — E785 Hyperlipidemia, unspecified: Secondary | ICD-10-CM | POA: Insufficient documentation

## 2012-03-25 DIAGNOSIS — R0989 Other specified symptoms and signs involving the circulatory and respiratory systems: Secondary | ICD-10-CM | POA: Insufficient documentation

## 2012-03-25 DIAGNOSIS — R0609 Other forms of dyspnea: Secondary | ICD-10-CM | POA: Insufficient documentation

## 2012-03-27 ENCOUNTER — Encounter (HOSPITAL_COMMUNITY)
Admission: RE | Admit: 2012-03-27 | Discharge: 2012-03-27 | Disposition: A | Discharge status: Home / Self Care | Payer: Self-pay | Source: Ambulatory Visit | Attending: Internal Medicine | Admitting: Internal Medicine

## 2012-03-28 DIAGNOSIS — Z79899 Other long term (current) drug therapy: Secondary | ICD-10-CM | POA: Diagnosis not present

## 2012-03-28 DIAGNOSIS — J84112 Idiopathic pulmonary fibrosis: Secondary | ICD-10-CM | POA: Diagnosis not present

## 2012-03-28 DIAGNOSIS — J8410 Pulmonary fibrosis, unspecified: Secondary | ICD-10-CM | POA: Diagnosis not present

## 2012-03-28 DIAGNOSIS — R5383 Other fatigue: Secondary | ICD-10-CM | POA: Diagnosis not present

## 2012-03-28 DIAGNOSIS — R5381 Other malaise: Secondary | ICD-10-CM | POA: Diagnosis not present

## 2012-04-01 ENCOUNTER — Encounter (HOSPITAL_COMMUNITY): Admission: RE | Admit: 2012-04-01 | Payer: Self-pay | Source: Ambulatory Visit

## 2012-04-01 DIAGNOSIS — N269 Renal sclerosis, unspecified: Secondary | ICD-10-CM | POA: Diagnosis not present

## 2012-04-01 DIAGNOSIS — E785 Hyperlipidemia, unspecified: Secondary | ICD-10-CM | POA: Diagnosis not present

## 2012-04-01 DIAGNOSIS — I10 Essential (primary) hypertension: Secondary | ICD-10-CM | POA: Diagnosis not present

## 2012-04-01 DIAGNOSIS — Z0000 Encounter for general adult medical examination without abnormal findings: Secondary | ICD-10-CM | POA: Diagnosis not present

## 2012-04-01 DIAGNOSIS — J8410 Pulmonary fibrosis, unspecified: Secondary | ICD-10-CM | POA: Diagnosis not present

## 2012-04-03 ENCOUNTER — Encounter (HOSPITAL_COMMUNITY): Payer: Self-pay

## 2012-04-08 ENCOUNTER — Encounter (HOSPITAL_COMMUNITY)
Admission: RE | Admit: 2012-04-08 | Discharge: 2012-04-08 | Disposition: A | Discharge status: Home / Self Care | Payer: Self-pay | Source: Ambulatory Visit | Attending: Internal Medicine | Admitting: Internal Medicine

## 2012-04-10 ENCOUNTER — Encounter (HOSPITAL_COMMUNITY)
Admission: RE | Admit: 2012-04-10 | Discharge: 2012-04-10 | Disposition: A | Discharge status: Home / Self Care | Payer: Self-pay | Source: Ambulatory Visit | Attending: Internal Medicine | Admitting: Internal Medicine

## 2012-04-15 ENCOUNTER — Encounter (HOSPITAL_COMMUNITY)
Admission: RE | Admit: 2012-04-15 | Discharge: 2012-04-15 | Disposition: A | Discharge status: Home / Self Care | Payer: Self-pay | Source: Ambulatory Visit | Attending: Internal Medicine | Admitting: Internal Medicine

## 2012-04-17 ENCOUNTER — Encounter (HOSPITAL_COMMUNITY): Payer: Self-pay

## 2012-04-22 ENCOUNTER — Encounter (HOSPITAL_COMMUNITY)
Admission: RE | Admit: 2012-04-22 | Discharge: 2012-04-22 | Disposition: A | Discharge status: Home / Self Care | Payer: Self-pay | Source: Ambulatory Visit | Attending: Internal Medicine | Admitting: Internal Medicine

## 2012-04-22 DIAGNOSIS — R059 Cough, unspecified: Secondary | ICD-10-CM | POA: Insufficient documentation

## 2012-04-22 DIAGNOSIS — Z5189 Encounter for other specified aftercare: Secondary | ICD-10-CM | POA: Insufficient documentation

## 2012-04-22 DIAGNOSIS — K219 Gastro-esophageal reflux disease without esophagitis: Secondary | ICD-10-CM | POA: Insufficient documentation

## 2012-04-22 DIAGNOSIS — I10 Essential (primary) hypertension: Secondary | ICD-10-CM | POA: Insufficient documentation

## 2012-04-22 DIAGNOSIS — J90 Pleural effusion, not elsewhere classified: Secondary | ICD-10-CM | POA: Insufficient documentation

## 2012-04-22 DIAGNOSIS — J8410 Pulmonary fibrosis, unspecified: Secondary | ICD-10-CM | POA: Insufficient documentation

## 2012-04-22 DIAGNOSIS — R05 Cough: Secondary | ICD-10-CM | POA: Insufficient documentation

## 2012-04-22 DIAGNOSIS — R0989 Other specified symptoms and signs involving the circulatory and respiratory systems: Secondary | ICD-10-CM | POA: Insufficient documentation

## 2012-04-22 DIAGNOSIS — R0609 Other forms of dyspnea: Secondary | ICD-10-CM | POA: Insufficient documentation

## 2012-04-22 DIAGNOSIS — E785 Hyperlipidemia, unspecified: Secondary | ICD-10-CM | POA: Insufficient documentation

## 2012-04-24 ENCOUNTER — Encounter (HOSPITAL_COMMUNITY)
Admission: RE | Admit: 2012-04-24 | Discharge: 2012-04-24 | Disposition: A | Discharge status: Home / Self Care | Payer: Self-pay | Source: Ambulatory Visit | Attending: Internal Medicine | Admitting: Internal Medicine

## 2012-04-29 ENCOUNTER — Encounter (HOSPITAL_COMMUNITY): Payer: Self-pay

## 2012-04-29 ENCOUNTER — Encounter: Payer: Self-pay | Admitting: Internal Medicine

## 2012-04-29 ENCOUNTER — Ambulatory Visit (INDEPENDENT_AMBULATORY_CARE_PROVIDER_SITE_OTHER): Payer: Medicare Other | Admitting: Internal Medicine

## 2012-04-29 VITALS — BP 114/66 | HR 61 | Ht 67.0 in | Wt 176.0 lb

## 2012-04-29 DIAGNOSIS — J8410 Pulmonary fibrosis, unspecified: Secondary | ICD-10-CM | POA: Diagnosis not present

## 2012-04-29 MED ORDER — PROMETHAZINE-CODEINE 6.25-10 MG/5ML PO SYRP: 5.0000 mL | ORAL_SOLUTION | Freq: Four times a day (QID) | ORAL | Status: DC | PRN

## 2012-04-29 NOTE — Patient Instructions (Addendum)
Since you are being followed closely at [facility], I will not duplicate their work. Please call as needed.  Script refilling cough syrup

## 2012-04-29 NOTE — Progress Notes (Signed)
Patient ID: [name] [name], male    DOB: July 11, 1934, 77 y.o.   MRN: [id number]  HPI 10/30/10- 9 yoM never smoker, followed for pulmonary fibrosis and RML nodule, complicated by bronchiectasis, GERD and HBP. Last here June 08, 2010- note reviewed.  He continues Pulmonary Rehab. He finished clinical trial at [facility] and has now started perfenadone/ Asend trial at [facility]. With these he is getting PFTs, and xrays at [facility].  He notes that dyspnea is stable- doe on stairs, able to play golf twice weekly. Dry cough is gradually worse. Codeine cough syrup and benzonatate help.   05/01/11- 10/30/10- 75 yoM never smoker, followed for pulmonary fibrosis and RML nodule, complicated by bronchiectasis, GERD and HBP Has had flu vaccine. Still participating in pulmonary rehabilitation twice a week as he continues with a research drug protocol at [facility]. He had a negative test for reflux recently. He describes a protracted "cold" since November 1. He may have a little fever and discolored sputum at the beginning. We had sent a Z-Pak and he is now taking an Alka-Seltzer product. Over the last 6 months he has felt about the same. He notices chronic cough with deep breath but no progressive shortness of breath. He says he looked stable on a 6 minute walk test and PFT at [facility]. Spiriva did not help- it is no longer being used.  10/29/11-  75 yoM never smoker, followed for pulmonary fibrosis and RML nodule, complicated by bronchiectasis, GERD and HBP  Patient c/o "slight" sob, and cough. Denies wheezing. He finishes his second drug trial for interstitial lung disease at [facility] and is now getting "the real by mouth". The protocol includes chest x-ray, PFT and 6 minute walk test being done there so we will not repeat them. Subjectively, and from what he is told at [facility], he is basically stable with persistent cough. Cough wakes him early in the mornings . He continues to play golf regularly.  In March he had double hernia  repair. As he recovers, he will resume pulmonary rehabilitation here in July.  04/29/12- 75 yoM never smoker, followed for pulmonary fibrosis and RML nodule, complicated by bronchiectasis, GERD and HBP FOLLOWS FOR: dry cough and still having slight SOB-no worse than last OV. He is still being followed now on open label perfenadone/ Pulmonary Fibrosis trial at [facility] and says he is now getting the active drug. He is holding his own. At last office visit at [facility] in November, he desaturated to 88% on his 6 minute walk test. He continues pulmonary rehabilitation here and likes it. Had flu vaccine. Denies cardiac problems. Coughs for about one hour each morning and this is his biggest problem. Uses Tessalon Perles with Phenergan codeine taken once at night. Prilosec controls heartburn. Has had pneumonia vaccine and flu vaccine. CXR 07/13/11 IMPRESSION:  No active disease. Stable mild hyperinflation and chronic fibrotic  changes.  Original Report Authenticated By: Natasha Mead, M.D.    Review of Systems- see HPI Constitutional:   No-   weight loss, night sweats, fevers, chills, fatigue, lassitude. HEENT:   No-  headaches, difficulty swallowing, tooth/dental problems, sore throat,       No-  sneezing, itching, ear ache, nasal congestion, post nasal drip,  CV:  No-   chest pain, orthopnea, PND, swelling in lower extremities, anasarca, dizziness, palpitations Resp: +  shortness of breath with exertion or at rest.              No-   productive  cough,  +non-productive cough,  No- coughing up of blood.              No-   change in color of mucus.  No- wheezing.   Skin: No-   rash or lesions. GI:  No-   heartburn, indigestion, abdominal pain, nausea, vomiting, GU: ain. MS:  No-   joint pain or swelling.   Neuro-     nothing unusual Psych:  No- change in mood or affect. No depression or anxiety.  No memory loss.   Objective:   Physical Exam General- Alert, Oriented, Affect-appropriate, Distress- none  acute Skin- rash-none, lesions- none, excoriation- none Lymphadenopathy- none Head- atraumatic            Eyes- Gross vision intact, PERRLA, conjunctivae clear secretions            Ears- Hearing, canals-normal            Nose- Clear, no-Septal dev, mucus, polyps, erosion, perforation             Throat- Mallampati II , mucosa clear , drainage- none, tonsils- atrophic Neck- flexible , trachea midline, no stridor , thyroid nl, carotid no bruit Chest - symmetrical excursion , unlabored           Heart/CV- RRR , no murmur , no gallop  , no rub, nl s1 s2                           - JVD- none , edema- none, stasis changes- none, varices- none           Lung- +diffuse crackles, wheeze- none, +mild dry  cough , dullness-none, rub- none           Chest wall-  Abd-  Br/ Gen/ Rectal- Not done, not indicated Extrem- cyanosis- none, clubbing- none, atrophy- none, strength- nl Neuro- grossly intact to observation

## 2012-05-01 ENCOUNTER — Encounter (HOSPITAL_COMMUNITY)
Admission: RE | Admit: 2012-05-01 | Discharge: 2012-05-01 | Disposition: A | Discharge status: Home / Self Care | Payer: Self-pay | Source: Ambulatory Visit | Attending: Internal Medicine | Admitting: Internal Medicine

## 2012-05-06 ENCOUNTER — Encounter (HOSPITAL_COMMUNITY)
Admission: RE | Admit: 2012-05-06 | Discharge: 2012-05-06 | Disposition: A | Discharge status: Home / Self Care | Payer: Self-pay | Source: Ambulatory Visit | Attending: Internal Medicine | Admitting: Internal Medicine

## 2012-05-07 NOTE — Assessment & Plan Note (Signed)
He is very nearly stable clinically although he may be desaturating a little bit more on his 6 minute walk test by his description. Those results are at [facility]. Cough is his biggest complaint. Plan-continue pulmonary rehabilitation. Refill cough syrup.

## 2012-05-08 ENCOUNTER — Encounter (HOSPITAL_COMMUNITY)
Admission: RE | Admit: 2012-05-08 | Discharge: 2012-05-08 | Disposition: A | Discharge status: Home / Self Care | Payer: Self-pay | Source: Ambulatory Visit | Attending: Internal Medicine | Admitting: Internal Medicine

## 2012-05-13 ENCOUNTER — Encounter (HOSPITAL_COMMUNITY): Payer: Self-pay

## 2012-05-15 ENCOUNTER — Encounter (HOSPITAL_COMMUNITY)
Admission: RE | Admit: 2012-05-15 | Discharge: 2012-05-15 | Disposition: A | Discharge status: Home / Self Care | Payer: Self-pay | Source: Ambulatory Visit | Attending: Internal Medicine | Admitting: Internal Medicine

## 2012-05-20 ENCOUNTER — Encounter (HOSPITAL_COMMUNITY): Payer: Self-pay

## 2012-05-22 ENCOUNTER — Encounter (HOSPITAL_COMMUNITY): Payer: Self-pay

## 2012-05-22 DIAGNOSIS — E785 Hyperlipidemia, unspecified: Secondary | ICD-10-CM | POA: Insufficient documentation

## 2012-05-22 DIAGNOSIS — J8410 Pulmonary fibrosis, unspecified: Secondary | ICD-10-CM | POA: Insufficient documentation

## 2012-05-22 DIAGNOSIS — I10 Essential (primary) hypertension: Secondary | ICD-10-CM | POA: Insufficient documentation

## 2012-05-22 DIAGNOSIS — R059 Cough, unspecified: Secondary | ICD-10-CM | POA: Insufficient documentation

## 2012-05-22 DIAGNOSIS — J90 Pleural effusion, not elsewhere classified: Secondary | ICD-10-CM | POA: Insufficient documentation

## 2012-05-22 DIAGNOSIS — Z5189 Encounter for other specified aftercare: Secondary | ICD-10-CM | POA: Insufficient documentation

## 2012-05-22 DIAGNOSIS — R0989 Other specified symptoms and signs involving the circulatory and respiratory systems: Secondary | ICD-10-CM | POA: Insufficient documentation

## 2012-05-22 DIAGNOSIS — K219 Gastro-esophageal reflux disease without esophagitis: Secondary | ICD-10-CM | POA: Insufficient documentation

## 2012-05-22 DIAGNOSIS — R05 Cough: Secondary | ICD-10-CM | POA: Insufficient documentation

## 2012-05-22 DIAGNOSIS — R0609 Other forms of dyspnea: Secondary | ICD-10-CM | POA: Insufficient documentation

## 2012-05-27 ENCOUNTER — Encounter (HOSPITAL_COMMUNITY): Payer: Self-pay

## 2012-05-29 ENCOUNTER — Encounter (HOSPITAL_COMMUNITY)
Admission: RE | Admit: 2012-05-29 | Discharge: 2012-05-29 | Disposition: A | Discharge status: Home / Self Care | Payer: Self-pay | Source: Ambulatory Visit | Attending: Internal Medicine | Admitting: Internal Medicine

## 2012-06-03 ENCOUNTER — Encounter (HOSPITAL_COMMUNITY)
Admission: RE | Admit: 2012-06-03 | Discharge: 2012-06-03 | Disposition: A | Discharge status: Home / Self Care | Payer: Self-pay | Source: Ambulatory Visit | Attending: Internal Medicine | Admitting: Internal Medicine

## 2012-06-05 ENCOUNTER — Encounter (HOSPITAL_COMMUNITY)
Admission: RE | Admit: 2012-06-05 | Discharge: 2012-06-05 | Disposition: A | Discharge status: Home / Self Care | Payer: Self-pay | Source: Ambulatory Visit | Attending: Internal Medicine | Admitting: Internal Medicine

## 2012-06-10 ENCOUNTER — Encounter (HOSPITAL_COMMUNITY)
Admission: RE | Admit: 2012-06-10 | Discharge: 2012-06-10 | Disposition: A | Discharge status: Home / Self Care | Payer: Self-pay | Source: Ambulatory Visit | Attending: Internal Medicine | Admitting: Internal Medicine

## 2012-06-12 ENCOUNTER — Encounter (HOSPITAL_COMMUNITY)
Admission: RE | Admit: 2012-06-12 | Discharge: 2012-06-12 | Disposition: A | Discharge status: Home / Self Care | Payer: Self-pay | Source: Ambulatory Visit | Attending: Internal Medicine | Admitting: Internal Medicine

## 2012-06-17 ENCOUNTER — Encounter (HOSPITAL_COMMUNITY)
Admission: RE | Admit: 2012-06-17 | Discharge: 2012-06-17 | Disposition: A | Discharge status: Home / Self Care | Payer: Self-pay | Source: Ambulatory Visit | Attending: Internal Medicine | Admitting: Internal Medicine

## 2012-06-19 ENCOUNTER — Encounter (HOSPITAL_COMMUNITY)
Admission: RE | Admit: 2012-06-19 | Discharge: 2012-06-19 | Disposition: A | Discharge status: Home / Self Care | Payer: Self-pay | Source: Ambulatory Visit | Attending: Internal Medicine | Admitting: Internal Medicine

## 2012-06-24 ENCOUNTER — Encounter (HOSPITAL_COMMUNITY)
Admission: RE | Admit: 2012-06-24 | Discharge: 2012-06-24 | Disposition: A | Discharge status: Home / Self Care | Payer: Self-pay | Source: Ambulatory Visit | Attending: Internal Medicine | Admitting: Internal Medicine

## 2012-06-24 DIAGNOSIS — K219 Gastro-esophageal reflux disease without esophagitis: Secondary | ICD-10-CM | POA: Insufficient documentation

## 2012-06-24 DIAGNOSIS — R0609 Other forms of dyspnea: Secondary | ICD-10-CM | POA: Insufficient documentation

## 2012-06-24 DIAGNOSIS — R05 Cough: Secondary | ICD-10-CM | POA: Insufficient documentation

## 2012-06-24 DIAGNOSIS — I10 Essential (primary) hypertension: Secondary | ICD-10-CM | POA: Insufficient documentation

## 2012-06-24 DIAGNOSIS — Z5189 Encounter for other specified aftercare: Secondary | ICD-10-CM | POA: Insufficient documentation

## 2012-06-24 DIAGNOSIS — J90 Pleural effusion, not elsewhere classified: Secondary | ICD-10-CM | POA: Insufficient documentation

## 2012-06-24 DIAGNOSIS — R059 Cough, unspecified: Secondary | ICD-10-CM | POA: Insufficient documentation

## 2012-06-24 DIAGNOSIS — E785 Hyperlipidemia, unspecified: Secondary | ICD-10-CM | POA: Insufficient documentation

## 2012-06-24 DIAGNOSIS — R0989 Other specified symptoms and signs involving the circulatory and respiratory systems: Secondary | ICD-10-CM | POA: Insufficient documentation

## 2012-06-24 DIAGNOSIS — J8410 Pulmonary fibrosis, unspecified: Secondary | ICD-10-CM | POA: Insufficient documentation

## 2012-06-26 ENCOUNTER — Encounter (HOSPITAL_COMMUNITY)
Admission: RE | Admit: 2012-06-26 | Discharge: 2012-06-26 | Disposition: A | Discharge status: Home / Self Care | Payer: Self-pay | Source: Ambulatory Visit | Attending: Internal Medicine | Admitting: Internal Medicine

## 2012-07-01 ENCOUNTER — Encounter (HOSPITAL_COMMUNITY)
Admission: RE | Admit: 2012-07-01 | Discharge: 2012-07-01 | Disposition: A | Discharge status: Home / Self Care | Payer: Self-pay | Source: Ambulatory Visit | Attending: Internal Medicine | Admitting: Internal Medicine

## 2012-07-02 ENCOUNTER — Other Ambulatory Visit: Payer: Self-pay | Admitting: Dermatology

## 2012-07-02 DIAGNOSIS — D485 Neoplasm of uncertain behavior of skin: Secondary | ICD-10-CM | POA: Diagnosis not present

## 2012-07-02 DIAGNOSIS — L851 Acquired keratosis [keratoderma] palmaris et plantaris: Secondary | ICD-10-CM | POA: Diagnosis not present

## 2012-07-02 DIAGNOSIS — L259 Unspecified contact dermatitis, unspecified cause: Secondary | ICD-10-CM | POA: Diagnosis not present

## 2012-07-03 ENCOUNTER — Encounter (HOSPITAL_COMMUNITY): Payer: Self-pay

## 2012-07-08 ENCOUNTER — Encounter (HOSPITAL_COMMUNITY)
Admission: RE | Admit: 2012-07-08 | Discharge: 2012-07-08 | Disposition: A | Discharge status: Home / Self Care | Payer: Self-pay | Source: Ambulatory Visit | Attending: Internal Medicine | Admitting: Internal Medicine

## 2012-07-10 ENCOUNTER — Encounter (HOSPITAL_COMMUNITY): Payer: Self-pay

## 2012-07-15 ENCOUNTER — Encounter (HOSPITAL_COMMUNITY)
Admission: RE | Admit: 2012-07-15 | Discharge: 2012-07-15 | Disposition: A | Discharge status: Home / Self Care | Payer: Self-pay | Source: Ambulatory Visit | Attending: Internal Medicine | Admitting: Internal Medicine

## 2012-07-17 ENCOUNTER — Encounter (HOSPITAL_COMMUNITY)
Admission: RE | Admit: 2012-07-17 | Discharge: 2012-07-17 | Disposition: A | Discharge status: Home / Self Care | Payer: Self-pay | Source: Ambulatory Visit | Attending: Internal Medicine | Admitting: Internal Medicine

## 2012-07-22 ENCOUNTER — Encounter (HOSPITAL_COMMUNITY): Payer: Self-pay

## 2012-07-22 DIAGNOSIS — Z5189 Encounter for other specified aftercare: Secondary | ICD-10-CM | POA: Insufficient documentation

## 2012-07-22 DIAGNOSIS — R059 Cough, unspecified: Secondary | ICD-10-CM | POA: Insufficient documentation

## 2012-07-22 DIAGNOSIS — R05 Cough: Secondary | ICD-10-CM | POA: Insufficient documentation

## 2012-07-22 DIAGNOSIS — K219 Gastro-esophageal reflux disease without esophagitis: Secondary | ICD-10-CM | POA: Insufficient documentation

## 2012-07-22 DIAGNOSIS — R0989 Other specified symptoms and signs involving the circulatory and respiratory systems: Secondary | ICD-10-CM | POA: Insufficient documentation

## 2012-07-22 DIAGNOSIS — J90 Pleural effusion, not elsewhere classified: Secondary | ICD-10-CM | POA: Insufficient documentation

## 2012-07-22 DIAGNOSIS — R0609 Other forms of dyspnea: Secondary | ICD-10-CM | POA: Insufficient documentation

## 2012-07-22 DIAGNOSIS — E785 Hyperlipidemia, unspecified: Secondary | ICD-10-CM | POA: Insufficient documentation

## 2012-07-22 DIAGNOSIS — J8410 Pulmonary fibrosis, unspecified: Secondary | ICD-10-CM | POA: Insufficient documentation

## 2012-07-22 DIAGNOSIS — I10 Essential (primary) hypertension: Secondary | ICD-10-CM | POA: Insufficient documentation

## 2012-07-24 ENCOUNTER — Encounter (HOSPITAL_COMMUNITY)
Admission: RE | Admit: 2012-07-24 | Discharge: 2012-07-24 | Disposition: A | Discharge status: Home / Self Care | Payer: Self-pay | Source: Ambulatory Visit | Attending: Internal Medicine | Admitting: Internal Medicine

## 2012-07-29 ENCOUNTER — Encounter (HOSPITAL_COMMUNITY): Payer: Self-pay

## 2012-07-31 ENCOUNTER — Encounter (HOSPITAL_COMMUNITY): Payer: Self-pay

## 2012-08-05 ENCOUNTER — Encounter (HOSPITAL_COMMUNITY): Payer: Self-pay

## 2012-08-07 ENCOUNTER — Encounter (HOSPITAL_COMMUNITY)
Admission: RE | Admit: 2012-08-07 | Discharge: 2012-08-07 | Disposition: A | Discharge status: Home / Self Care | Payer: Self-pay | Source: Ambulatory Visit | Attending: Internal Medicine | Admitting: Internal Medicine

## 2012-08-12 ENCOUNTER — Encounter (HOSPITAL_COMMUNITY)
Admission: RE | Admit: 2012-08-12 | Discharge: 2012-08-12 | Disposition: A | Discharge status: Home / Self Care | Payer: Self-pay | Source: Ambulatory Visit | Attending: Internal Medicine | Admitting: Internal Medicine

## 2012-08-14 ENCOUNTER — Encounter (HOSPITAL_COMMUNITY)
Admission: RE | Admit: 2012-08-14 | Discharge: 2012-08-14 | Disposition: A | Discharge status: Home / Self Care | Payer: Self-pay | Source: Ambulatory Visit | Attending: Internal Medicine | Admitting: Internal Medicine

## 2012-08-19 ENCOUNTER — Encounter (HOSPITAL_COMMUNITY)
Admission: RE | Admit: 2012-08-19 | Discharge: 2012-08-19 | Disposition: A | Discharge status: Home / Self Care | Payer: Self-pay | Source: Ambulatory Visit | Attending: Internal Medicine | Admitting: Internal Medicine

## 2012-08-19 DIAGNOSIS — Z5189 Encounter for other specified aftercare: Secondary | ICD-10-CM | POA: Insufficient documentation

## 2012-08-19 DIAGNOSIS — R05 Cough: Secondary | ICD-10-CM | POA: Insufficient documentation

## 2012-08-19 DIAGNOSIS — R0609 Other forms of dyspnea: Secondary | ICD-10-CM | POA: Insufficient documentation

## 2012-08-19 DIAGNOSIS — R0989 Other specified symptoms and signs involving the circulatory and respiratory systems: Secondary | ICD-10-CM | POA: Insufficient documentation

## 2012-08-19 DIAGNOSIS — R059 Cough, unspecified: Secondary | ICD-10-CM | POA: Insufficient documentation

## 2012-08-19 DIAGNOSIS — K219 Gastro-esophageal reflux disease without esophagitis: Secondary | ICD-10-CM | POA: Insufficient documentation

## 2012-08-19 DIAGNOSIS — J8410 Pulmonary fibrosis, unspecified: Secondary | ICD-10-CM | POA: Insufficient documentation

## 2012-08-19 DIAGNOSIS — J90 Pleural effusion, not elsewhere classified: Secondary | ICD-10-CM | POA: Insufficient documentation

## 2012-08-19 DIAGNOSIS — E785 Hyperlipidemia, unspecified: Secondary | ICD-10-CM | POA: Insufficient documentation

## 2012-08-19 DIAGNOSIS — I10 Essential (primary) hypertension: Secondary | ICD-10-CM | POA: Insufficient documentation

## 2012-08-21 ENCOUNTER — Encounter (HOSPITAL_COMMUNITY)
Admission: RE | Admit: 2012-08-21 | Discharge: 2012-08-21 | Disposition: A | Discharge status: Home / Self Care | Payer: Self-pay | Source: Ambulatory Visit | Attending: Internal Medicine | Admitting: Internal Medicine

## 2012-08-26 ENCOUNTER — Encounter (HOSPITAL_COMMUNITY)
Admission: RE | Admit: 2012-08-26 | Discharge: 2012-08-26 | Disposition: A | Discharge status: Home / Self Care | Payer: Self-pay | Source: Ambulatory Visit | Attending: Internal Medicine | Admitting: Internal Medicine

## 2012-08-28 ENCOUNTER — Encounter (HOSPITAL_COMMUNITY)
Admission: RE | Admit: 2012-08-28 | Discharge: 2012-08-28 | Disposition: A | Discharge status: Home / Self Care | Payer: Self-pay | Source: Ambulatory Visit | Attending: Pulmonary Disease | Admitting: Pulmonary Disease

## 2012-08-29 ENCOUNTER — Encounter: Payer: Self-pay | Admitting: Cardiology

## 2012-09-02 ENCOUNTER — Encounter: Payer: Self-pay | Admitting: Cardiology

## 2012-09-02 ENCOUNTER — Encounter (HOSPITAL_COMMUNITY): Payer: Self-pay

## 2012-09-04 ENCOUNTER — Encounter (HOSPITAL_COMMUNITY): Payer: Self-pay

## 2012-09-09 ENCOUNTER — Encounter (HOSPITAL_COMMUNITY): Payer: Self-pay

## 2012-09-11 ENCOUNTER — Encounter (HOSPITAL_COMMUNITY): Payer: Self-pay

## 2012-09-16 ENCOUNTER — Encounter (HOSPITAL_COMMUNITY)
Admission: RE | Admit: 2012-09-16 | Discharge: 2012-09-16 | Disposition: A | Discharge status: Home / Self Care | Payer: Self-pay | Source: Ambulatory Visit | Attending: Internal Medicine | Admitting: Internal Medicine

## 2012-09-18 ENCOUNTER — Encounter (HOSPITAL_COMMUNITY)
Admission: RE | Admit: 2012-09-18 | Discharge: 2012-09-18 | Disposition: A | Discharge status: Home / Self Care | Payer: Self-pay | Source: Ambulatory Visit | Attending: Internal Medicine | Admitting: Internal Medicine

## 2012-09-18 DIAGNOSIS — K219 Gastro-esophageal reflux disease without esophagitis: Secondary | ICD-10-CM | POA: Insufficient documentation

## 2012-09-18 DIAGNOSIS — R059 Cough, unspecified: Secondary | ICD-10-CM | POA: Insufficient documentation

## 2012-09-18 DIAGNOSIS — R0609 Other forms of dyspnea: Secondary | ICD-10-CM | POA: Insufficient documentation

## 2012-09-18 DIAGNOSIS — J8410 Pulmonary fibrosis, unspecified: Secondary | ICD-10-CM | POA: Insufficient documentation

## 2012-09-18 DIAGNOSIS — R05 Cough: Secondary | ICD-10-CM | POA: Insufficient documentation

## 2012-09-18 DIAGNOSIS — R0989 Other specified symptoms and signs involving the circulatory and respiratory systems: Secondary | ICD-10-CM | POA: Insufficient documentation

## 2012-09-18 DIAGNOSIS — Z5189 Encounter for other specified aftercare: Secondary | ICD-10-CM | POA: Insufficient documentation

## 2012-09-18 DIAGNOSIS — I10 Essential (primary) hypertension: Secondary | ICD-10-CM | POA: Insufficient documentation

## 2012-09-18 DIAGNOSIS — J90 Pleural effusion, not elsewhere classified: Secondary | ICD-10-CM | POA: Insufficient documentation

## 2012-09-18 DIAGNOSIS — E785 Hyperlipidemia, unspecified: Secondary | ICD-10-CM | POA: Insufficient documentation

## 2012-09-19 DIAGNOSIS — J8410 Pulmonary fibrosis, unspecified: Secondary | ICD-10-CM | POA: Diagnosis not present

## 2012-09-19 DIAGNOSIS — Z006 Encounter for examination for normal comparison and control in clinical research program: Secondary | ICD-10-CM | POA: Diagnosis not present

## 2012-09-19 DIAGNOSIS — J84112 Idiopathic pulmonary fibrosis: Secondary | ICD-10-CM | POA: Diagnosis not present

## 2012-09-23 ENCOUNTER — Encounter (HOSPITAL_COMMUNITY)
Admission: RE | Admit: 2012-09-23 | Discharge: 2012-09-23 | Disposition: A | Discharge status: Home / Self Care | Payer: Self-pay | Source: Ambulatory Visit | Attending: Internal Medicine | Admitting: Internal Medicine

## 2012-09-25 ENCOUNTER — Encounter (HOSPITAL_COMMUNITY)
Admission: RE | Admit: 2012-09-25 | Discharge: 2012-09-25 | Disposition: A | Discharge status: Home / Self Care | Payer: Self-pay | Source: Ambulatory Visit | Attending: Internal Medicine | Admitting: Internal Medicine

## 2012-09-29 ENCOUNTER — Encounter (HOSPITAL_COMMUNITY): Payer: Self-pay

## 2012-09-30 ENCOUNTER — Encounter (HOSPITAL_COMMUNITY): Admission: RE | Admit: 2012-09-30 | Payer: Self-pay | Source: Ambulatory Visit

## 2012-10-02 ENCOUNTER — Encounter (HOSPITAL_COMMUNITY): Payer: Self-pay

## 2012-10-06 ENCOUNTER — Encounter (HOSPITAL_COMMUNITY): Payer: Self-pay

## 2012-10-07 ENCOUNTER — Encounter (HOSPITAL_COMMUNITY)
Admission: RE | Admit: 2012-10-07 | Discharge: 2012-10-07 | Disposition: A | Discharge status: Home / Self Care | Payer: Self-pay | Source: Ambulatory Visit | Attending: Internal Medicine | Admitting: Internal Medicine

## 2012-10-08 DIAGNOSIS — I10 Essential (primary) hypertension: Secondary | ICD-10-CM | POA: Diagnosis not present

## 2012-10-08 DIAGNOSIS — E785 Hyperlipidemia, unspecified: Secondary | ICD-10-CM | POA: Diagnosis not present

## 2012-10-08 DIAGNOSIS — J8410 Pulmonary fibrosis, unspecified: Secondary | ICD-10-CM | POA: Diagnosis not present

## 2012-10-08 DIAGNOSIS — K219 Gastro-esophageal reflux disease without esophagitis: Secondary | ICD-10-CM | POA: Diagnosis not present

## 2012-10-09 ENCOUNTER — Encounter (HOSPITAL_COMMUNITY)
Admission: RE | Admit: 2012-10-09 | Discharge: 2012-10-09 | Disposition: A | Discharge status: Home / Self Care | Payer: Self-pay | Source: Ambulatory Visit | Attending: Internal Medicine | Admitting: Internal Medicine

## 2012-10-13 ENCOUNTER — Encounter (HOSPITAL_COMMUNITY): Payer: Self-pay

## 2012-10-14 ENCOUNTER — Encounter (HOSPITAL_COMMUNITY)
Admission: RE | Admit: 2012-10-14 | Discharge: 2012-10-14 | Disposition: A | Discharge status: Home / Self Care | Payer: Self-pay | Source: Ambulatory Visit | Attending: Internal Medicine | Admitting: Internal Medicine

## 2012-10-16 ENCOUNTER — Encounter (HOSPITAL_COMMUNITY)
Admission: RE | Admit: 2012-10-16 | Discharge: 2012-10-16 | Disposition: A | Discharge status: Home / Self Care | Payer: Self-pay | Source: Ambulatory Visit | Attending: Internal Medicine | Admitting: Internal Medicine

## 2012-10-20 ENCOUNTER — Encounter (HOSPITAL_COMMUNITY): Payer: Self-pay

## 2012-10-21 ENCOUNTER — Encounter (HOSPITAL_COMMUNITY): Payer: Self-pay

## 2012-10-21 DIAGNOSIS — R059 Cough, unspecified: Secondary | ICD-10-CM | POA: Insufficient documentation

## 2012-10-21 DIAGNOSIS — Z5189 Encounter for other specified aftercare: Secondary | ICD-10-CM | POA: Insufficient documentation

## 2012-10-21 DIAGNOSIS — J90 Pleural effusion, not elsewhere classified: Secondary | ICD-10-CM | POA: Insufficient documentation

## 2012-10-21 DIAGNOSIS — J8410 Pulmonary fibrosis, unspecified: Secondary | ICD-10-CM | POA: Insufficient documentation

## 2012-10-21 DIAGNOSIS — R05 Cough: Secondary | ICD-10-CM | POA: Insufficient documentation

## 2012-10-21 DIAGNOSIS — K219 Gastro-esophageal reflux disease without esophagitis: Secondary | ICD-10-CM | POA: Insufficient documentation

## 2012-10-21 DIAGNOSIS — E785 Hyperlipidemia, unspecified: Secondary | ICD-10-CM | POA: Insufficient documentation

## 2012-10-21 DIAGNOSIS — R0989 Other specified symptoms and signs involving the circulatory and respiratory systems: Secondary | ICD-10-CM | POA: Insufficient documentation

## 2012-10-21 DIAGNOSIS — R0609 Other forms of dyspnea: Secondary | ICD-10-CM | POA: Insufficient documentation

## 2012-10-21 DIAGNOSIS — I10 Essential (primary) hypertension: Secondary | ICD-10-CM | POA: Insufficient documentation

## 2012-10-23 ENCOUNTER — Encounter (HOSPITAL_COMMUNITY): Payer: Self-pay

## 2012-10-27 ENCOUNTER — Encounter (HOSPITAL_COMMUNITY): Payer: Self-pay

## 2012-10-28 ENCOUNTER — Encounter (HOSPITAL_COMMUNITY)
Admission: RE | Admit: 2012-10-28 | Discharge: 2012-10-28 | Disposition: A | Discharge status: Home / Self Care | Payer: Self-pay | Source: Ambulatory Visit | Attending: Internal Medicine | Admitting: Internal Medicine

## 2012-10-30 ENCOUNTER — Encounter (HOSPITAL_COMMUNITY): Payer: Self-pay

## 2012-11-03 ENCOUNTER — Encounter (HOSPITAL_COMMUNITY): Payer: Self-pay

## 2012-11-04 ENCOUNTER — Encounter (HOSPITAL_COMMUNITY)
Admission: RE | Admit: 2012-11-04 | Discharge: 2012-11-04 | Disposition: A | Discharge status: Home / Self Care | Payer: Self-pay | Source: Ambulatory Visit | Attending: Internal Medicine | Admitting: Internal Medicine

## 2012-11-06 ENCOUNTER — Encounter (HOSPITAL_COMMUNITY)
Admission: RE | Admit: 2012-11-06 | Discharge: 2012-11-06 | Disposition: A | Discharge status: Home / Self Care | Payer: Self-pay | Source: Ambulatory Visit | Attending: Internal Medicine | Admitting: Internal Medicine

## 2012-11-10 ENCOUNTER — Encounter (HOSPITAL_COMMUNITY): Payer: Self-pay

## 2012-11-11 ENCOUNTER — Encounter (HOSPITAL_COMMUNITY): Payer: Self-pay

## 2012-11-13 ENCOUNTER — Encounter (HOSPITAL_COMMUNITY): Payer: Self-pay

## 2012-11-17 ENCOUNTER — Encounter (HOSPITAL_COMMUNITY): Payer: Self-pay

## 2012-11-18 ENCOUNTER — Encounter (HOSPITAL_COMMUNITY)
Admission: RE | Admit: 2012-11-18 | Discharge: 2012-11-18 | Disposition: A | Discharge status: Home / Self Care | Payer: Self-pay | Source: Ambulatory Visit | Attending: Internal Medicine | Admitting: Internal Medicine

## 2012-11-18 DIAGNOSIS — K219 Gastro-esophageal reflux disease without esophagitis: Secondary | ICD-10-CM | POA: Insufficient documentation

## 2012-11-18 DIAGNOSIS — R059 Cough, unspecified: Secondary | ICD-10-CM | POA: Insufficient documentation

## 2012-11-18 DIAGNOSIS — J90 Pleural effusion, not elsewhere classified: Secondary | ICD-10-CM | POA: Insufficient documentation

## 2012-11-18 DIAGNOSIS — R0609 Other forms of dyspnea: Secondary | ICD-10-CM | POA: Insufficient documentation

## 2012-11-18 DIAGNOSIS — I10 Essential (primary) hypertension: Secondary | ICD-10-CM | POA: Insufficient documentation

## 2012-11-18 DIAGNOSIS — J8410 Pulmonary fibrosis, unspecified: Secondary | ICD-10-CM | POA: Insufficient documentation

## 2012-11-18 DIAGNOSIS — Z5189 Encounter for other specified aftercare: Secondary | ICD-10-CM | POA: Insufficient documentation

## 2012-11-18 DIAGNOSIS — E785 Hyperlipidemia, unspecified: Secondary | ICD-10-CM | POA: Insufficient documentation

## 2012-11-18 DIAGNOSIS — R0989 Other specified symptoms and signs involving the circulatory and respiratory systems: Secondary | ICD-10-CM | POA: Insufficient documentation

## 2012-11-18 DIAGNOSIS — R05 Cough: Secondary | ICD-10-CM | POA: Insufficient documentation

## 2012-11-20 ENCOUNTER — Encounter (HOSPITAL_COMMUNITY): Payer: Self-pay

## 2012-11-24 ENCOUNTER — Encounter (HOSPITAL_COMMUNITY): Payer: Self-pay

## 2012-11-25 ENCOUNTER — Encounter (HOSPITAL_COMMUNITY)
Admission: RE | Admit: 2012-11-25 | Discharge: 2012-11-25 | Disposition: A | Discharge status: Home / Self Care | Payer: Self-pay | Source: Ambulatory Visit | Attending: Internal Medicine | Admitting: Internal Medicine

## 2012-11-27 ENCOUNTER — Encounter (HOSPITAL_COMMUNITY)
Admission: RE | Admit: 2012-11-27 | Discharge: 2012-11-27 | Disposition: A | Discharge status: Home / Self Care | Payer: Self-pay | Source: Ambulatory Visit | Attending: Internal Medicine | Admitting: Internal Medicine

## 2012-12-01 ENCOUNTER — Encounter (HOSPITAL_COMMUNITY): Payer: Self-pay

## 2012-12-02 ENCOUNTER — Encounter (HOSPITAL_COMMUNITY)
Admission: RE | Admit: 2012-12-02 | Discharge: 2012-12-02 | Disposition: A | Discharge status: Home / Self Care | Payer: Self-pay | Source: Ambulatory Visit | Attending: Internal Medicine | Admitting: Internal Medicine

## 2012-12-04 ENCOUNTER — Encounter (HOSPITAL_COMMUNITY)
Admission: RE | Admit: 2012-12-04 | Discharge: 2012-12-04 | Disposition: A | Discharge status: Home / Self Care | Payer: Self-pay | Source: Ambulatory Visit | Attending: Internal Medicine | Admitting: Internal Medicine

## 2012-12-04 ENCOUNTER — Other Ambulatory Visit: Payer: Self-pay | Admitting: Internal Medicine

## 2012-12-05 NOTE — Telephone Encounter (Signed)
Last OV with [facility]: 04/1012; was asked to f/u in 1 yr Pt has a pending OV with [facility] on 12/101/14 Per [facility]: ok to give rx.

## 2012-12-08 ENCOUNTER — Encounter (HOSPITAL_COMMUNITY): Payer: Self-pay

## 2012-12-09 ENCOUNTER — Encounter (HOSPITAL_COMMUNITY)
Admission: RE | Admit: 2012-12-09 | Discharge: 2012-12-09 | Disposition: A | Discharge status: Home / Self Care | Payer: Self-pay | Source: Ambulatory Visit | Attending: Internal Medicine | Admitting: Internal Medicine

## 2012-12-11 ENCOUNTER — Encounter (HOSPITAL_COMMUNITY): Payer: Self-pay

## 2012-12-15 ENCOUNTER — Encounter (HOSPITAL_COMMUNITY): Payer: Self-pay

## 2012-12-16 ENCOUNTER — Encounter (HOSPITAL_COMMUNITY)
Admission: RE | Admit: 2012-12-16 | Discharge: 2012-12-16 | Disposition: A | Discharge status: Home / Self Care | Payer: Self-pay | Source: Ambulatory Visit | Attending: Internal Medicine | Admitting: Internal Medicine

## 2012-12-18 ENCOUNTER — Encounter (HOSPITAL_COMMUNITY)
Admission: RE | Admit: 2012-12-18 | Discharge: 2012-12-18 | Disposition: A | Discharge status: Home / Self Care | Payer: Self-pay | Source: Ambulatory Visit | Attending: Internal Medicine | Admitting: Internal Medicine

## 2012-12-22 ENCOUNTER — Encounter (HOSPITAL_COMMUNITY): Payer: Self-pay

## 2012-12-23 ENCOUNTER — Encounter (HOSPITAL_COMMUNITY): Payer: Self-pay

## 2012-12-23 DIAGNOSIS — K219 Gastro-esophageal reflux disease without esophagitis: Secondary | ICD-10-CM | POA: Insufficient documentation

## 2012-12-23 DIAGNOSIS — Z5189 Encounter for other specified aftercare: Secondary | ICD-10-CM | POA: Insufficient documentation

## 2012-12-23 DIAGNOSIS — E785 Hyperlipidemia, unspecified: Secondary | ICD-10-CM | POA: Insufficient documentation

## 2012-12-23 DIAGNOSIS — R0609 Other forms of dyspnea: Secondary | ICD-10-CM | POA: Insufficient documentation

## 2012-12-23 DIAGNOSIS — R05 Cough: Secondary | ICD-10-CM | POA: Insufficient documentation

## 2012-12-23 DIAGNOSIS — R059 Cough, unspecified: Secondary | ICD-10-CM | POA: Insufficient documentation

## 2012-12-23 DIAGNOSIS — R0989 Other specified symptoms and signs involving the circulatory and respiratory systems: Secondary | ICD-10-CM | POA: Insufficient documentation

## 2012-12-23 DIAGNOSIS — J8410 Pulmonary fibrosis, unspecified: Secondary | ICD-10-CM | POA: Insufficient documentation

## 2012-12-23 DIAGNOSIS — I10 Essential (primary) hypertension: Secondary | ICD-10-CM | POA: Insufficient documentation

## 2012-12-23 DIAGNOSIS — J90 Pleural effusion, not elsewhere classified: Secondary | ICD-10-CM | POA: Insufficient documentation

## 2012-12-25 ENCOUNTER — Encounter (HOSPITAL_COMMUNITY): Payer: Self-pay

## 2012-12-29 ENCOUNTER — Encounter (HOSPITAL_COMMUNITY): Payer: Self-pay

## 2012-12-30 ENCOUNTER — Encounter (HOSPITAL_COMMUNITY)
Admission: RE | Admit: 2012-12-30 | Discharge: 2012-12-30 | Disposition: A | Discharge status: Home / Self Care | Payer: Self-pay | Source: Ambulatory Visit | Attending: Internal Medicine | Admitting: Internal Medicine

## 2013-01-01 ENCOUNTER — Encounter (HOSPITAL_COMMUNITY)
Admission: RE | Admit: 2013-01-01 | Discharge: 2013-01-01 | Disposition: A | Discharge status: Home / Self Care | Payer: Self-pay | Source: Ambulatory Visit | Attending: Internal Medicine | Admitting: Internal Medicine

## 2013-01-05 ENCOUNTER — Encounter (HOSPITAL_COMMUNITY): Payer: Self-pay

## 2013-01-05 DIAGNOSIS — L259 Unspecified contact dermatitis, unspecified cause: Secondary | ICD-10-CM | POA: Diagnosis not present

## 2013-01-06 ENCOUNTER — Encounter (HOSPITAL_COMMUNITY): Payer: Self-pay

## 2013-01-08 ENCOUNTER — Encounter (HOSPITAL_COMMUNITY)
Admission: RE | Admit: 2013-01-08 | Discharge: 2013-01-08 | Disposition: A | Discharge status: Home / Self Care | Payer: Self-pay | Source: Ambulatory Visit | Attending: Internal Medicine | Admitting: Internal Medicine

## 2013-01-12 ENCOUNTER — Encounter (HOSPITAL_COMMUNITY): Payer: Self-pay

## 2013-01-12 DIAGNOSIS — L568 Other specified acute skin changes due to ultraviolet radiation: Secondary | ICD-10-CM | POA: Diagnosis not present

## 2013-01-13 ENCOUNTER — Encounter (HOSPITAL_COMMUNITY)
Admission: RE | Admit: 2013-01-13 | Discharge: 2013-01-13 | Disposition: A | Discharge status: Home / Self Care | Payer: Self-pay | Source: Ambulatory Visit | Attending: Internal Medicine | Admitting: Internal Medicine

## 2013-01-15 ENCOUNTER — Encounter (HOSPITAL_COMMUNITY): Payer: Self-pay

## 2013-01-20 ENCOUNTER — Encounter (HOSPITAL_COMMUNITY)
Admission: RE | Admit: 2013-01-20 | Discharge: 2013-01-20 | Disposition: A | Discharge status: Home / Self Care | Payer: Self-pay | Source: Ambulatory Visit | Attending: Internal Medicine | Admitting: Internal Medicine

## 2013-01-20 DIAGNOSIS — J8410 Pulmonary fibrosis, unspecified: Secondary | ICD-10-CM | POA: Insufficient documentation

## 2013-01-20 DIAGNOSIS — R0609 Other forms of dyspnea: Secondary | ICD-10-CM | POA: Insufficient documentation

## 2013-01-20 DIAGNOSIS — Z5189 Encounter for other specified aftercare: Secondary | ICD-10-CM | POA: Insufficient documentation

## 2013-01-20 DIAGNOSIS — R05 Cough: Secondary | ICD-10-CM | POA: Insufficient documentation

## 2013-01-20 DIAGNOSIS — K219 Gastro-esophageal reflux disease without esophagitis: Secondary | ICD-10-CM | POA: Insufficient documentation

## 2013-01-20 DIAGNOSIS — R059 Cough, unspecified: Secondary | ICD-10-CM | POA: Insufficient documentation

## 2013-01-20 DIAGNOSIS — J90 Pleural effusion, not elsewhere classified: Secondary | ICD-10-CM | POA: Insufficient documentation

## 2013-01-20 DIAGNOSIS — I10 Essential (primary) hypertension: Secondary | ICD-10-CM | POA: Insufficient documentation

## 2013-01-20 DIAGNOSIS — E785 Hyperlipidemia, unspecified: Secondary | ICD-10-CM | POA: Insufficient documentation

## 2013-01-20 DIAGNOSIS — R0989 Other specified symptoms and signs involving the circulatory and respiratory systems: Secondary | ICD-10-CM | POA: Insufficient documentation

## 2013-01-22 ENCOUNTER — Encounter (HOSPITAL_COMMUNITY)
Admission: RE | Admit: 2013-01-22 | Discharge: 2013-01-22 | Disposition: A | Discharge status: Home / Self Care | Payer: Self-pay | Source: Ambulatory Visit | Attending: Internal Medicine | Admitting: Internal Medicine

## 2013-01-23 DIAGNOSIS — C679 Malignant neoplasm of bladder, unspecified: Secondary | ICD-10-CM | POA: Diagnosis not present

## 2013-01-23 DIAGNOSIS — N401 Enlarged prostate with lower urinary tract symptoms: Secondary | ICD-10-CM | POA: Diagnosis not present

## 2013-01-27 ENCOUNTER — Encounter (HOSPITAL_COMMUNITY)
Admission: RE | Admit: 2013-01-27 | Discharge: 2013-01-27 | Disposition: A | Discharge status: Home / Self Care | Payer: Self-pay | Source: Ambulatory Visit | Attending: Internal Medicine | Admitting: Internal Medicine

## 2013-01-29 ENCOUNTER — Encounter (HOSPITAL_COMMUNITY)
Admission: RE | Admit: 2013-01-29 | Discharge: 2013-01-29 | Disposition: A | Discharge status: Home / Self Care | Payer: Self-pay | Source: Ambulatory Visit | Attending: Internal Medicine | Admitting: Internal Medicine

## 2013-02-05 ENCOUNTER — Encounter (HOSPITAL_COMMUNITY): Payer: Medicare Other | Attending: Internal Medicine

## 2013-02-05 DIAGNOSIS — J8410 Pulmonary fibrosis, unspecified: Secondary | ICD-10-CM | POA: Insufficient documentation

## 2013-02-05 DIAGNOSIS — R21 Rash and other nonspecific skin eruption: Secondary | ICD-10-CM | POA: Diagnosis not present

## 2013-02-05 DIAGNOSIS — J90 Pleural effusion, not elsewhere classified: Secondary | ICD-10-CM | POA: Insufficient documentation

## 2013-02-05 DIAGNOSIS — R059 Cough, unspecified: Secondary | ICD-10-CM | POA: Diagnosis not present

## 2013-02-05 DIAGNOSIS — R05 Cough: Secondary | ICD-10-CM | POA: Insufficient documentation

## 2013-02-05 DIAGNOSIS — Z5189 Encounter for other specified aftercare: Secondary | ICD-10-CM | POA: Insufficient documentation

## 2013-02-05 DIAGNOSIS — R0609 Other forms of dyspnea: Secondary | ICD-10-CM | POA: Insufficient documentation

## 2013-02-05 DIAGNOSIS — Z79899 Other long term (current) drug therapy: Secondary | ICD-10-CM | POA: Diagnosis not present

## 2013-02-05 DIAGNOSIS — E785 Hyperlipidemia, unspecified: Secondary | ICD-10-CM | POA: Insufficient documentation

## 2013-02-05 DIAGNOSIS — R091 Pleurisy: Secondary | ICD-10-CM | POA: Diagnosis not present

## 2013-02-05 DIAGNOSIS — R0902 Hypoxemia: Secondary | ICD-10-CM | POA: Diagnosis not present

## 2013-02-05 DIAGNOSIS — R918 Other nonspecific abnormal finding of lung field: Secondary | ICD-10-CM | POA: Diagnosis not present

## 2013-02-05 DIAGNOSIS — I10 Essential (primary) hypertension: Secondary | ICD-10-CM | POA: Insufficient documentation

## 2013-02-05 DIAGNOSIS — K219 Gastro-esophageal reflux disease without esophagitis: Secondary | ICD-10-CM | POA: Insufficient documentation

## 2013-02-05 DIAGNOSIS — R0989 Other specified symptoms and signs involving the circulatory and respiratory systems: Secondary | ICD-10-CM | POA: Insufficient documentation

## 2013-02-10 ENCOUNTER — Encounter (HOSPITAL_COMMUNITY): Payer: Medicare Other

## 2013-02-12 ENCOUNTER — Encounter (HOSPITAL_COMMUNITY): Payer: Medicare Other

## 2013-02-17 ENCOUNTER — Encounter (HOSPITAL_COMMUNITY): Payer: Medicare Other

## 2013-02-19 ENCOUNTER — Encounter (HOSPITAL_COMMUNITY): Admission: RE | Admit: 2013-02-19 | Payer: Self-pay | Source: Ambulatory Visit

## 2013-02-19 DIAGNOSIS — R05 Cough: Secondary | ICD-10-CM | POA: Insufficient documentation

## 2013-02-19 DIAGNOSIS — E785 Hyperlipidemia, unspecified: Secondary | ICD-10-CM | POA: Insufficient documentation

## 2013-02-19 DIAGNOSIS — R0609 Other forms of dyspnea: Secondary | ICD-10-CM | POA: Insufficient documentation

## 2013-02-19 DIAGNOSIS — Z5189 Encounter for other specified aftercare: Secondary | ICD-10-CM | POA: Insufficient documentation

## 2013-02-19 DIAGNOSIS — R0989 Other specified symptoms and signs involving the circulatory and respiratory systems: Secondary | ICD-10-CM | POA: Insufficient documentation

## 2013-02-19 DIAGNOSIS — J8410 Pulmonary fibrosis, unspecified: Secondary | ICD-10-CM | POA: Insufficient documentation

## 2013-02-19 DIAGNOSIS — K219 Gastro-esophageal reflux disease without esophagitis: Secondary | ICD-10-CM | POA: Insufficient documentation

## 2013-02-19 DIAGNOSIS — J90 Pleural effusion, not elsewhere classified: Secondary | ICD-10-CM | POA: Insufficient documentation

## 2013-02-19 DIAGNOSIS — R059 Cough, unspecified: Secondary | ICD-10-CM | POA: Insufficient documentation

## 2013-02-19 DIAGNOSIS — I10 Essential (primary) hypertension: Secondary | ICD-10-CM | POA: Insufficient documentation

## 2013-02-24 ENCOUNTER — Encounter (HOSPITAL_COMMUNITY)
Admission: RE | Admit: 2013-02-24 | Discharge: 2013-02-24 | Disposition: A | Discharge status: Home / Self Care | Payer: Self-pay | Source: Ambulatory Visit | Attending: Internal Medicine | Admitting: Internal Medicine

## 2013-02-24 ENCOUNTER — Ambulatory Visit (INDEPENDENT_AMBULATORY_CARE_PROVIDER_SITE_OTHER): Payer: Medicare Other | Admitting: Internal Medicine

## 2013-02-24 ENCOUNTER — Encounter: Payer: Self-pay | Admitting: Internal Medicine

## 2013-02-24 VITALS — BP 140/70 | HR 55 | Ht 67.0 in | Wt 171.6 lb

## 2013-02-24 DIAGNOSIS — J209 Acute bronchitis, unspecified: Secondary | ICD-10-CM

## 2013-02-24 DIAGNOSIS — J8410 Pulmonary fibrosis, unspecified: Secondary | ICD-10-CM | POA: Diagnosis not present

## 2013-02-24 DIAGNOSIS — R059 Cough, unspecified: Secondary | ICD-10-CM | POA: Insufficient documentation

## 2013-02-24 DIAGNOSIS — R05 Cough: Secondary | ICD-10-CM | POA: Insufficient documentation

## 2013-02-24 DIAGNOSIS — R0989 Other specified symptoms and signs involving the circulatory and respiratory systems: Secondary | ICD-10-CM | POA: Insufficient documentation

## 2013-02-24 DIAGNOSIS — Z5189 Encounter for other specified aftercare: Secondary | ICD-10-CM | POA: Insufficient documentation

## 2013-02-24 DIAGNOSIS — I10 Essential (primary) hypertension: Secondary | ICD-10-CM | POA: Insufficient documentation

## 2013-02-24 DIAGNOSIS — K219 Gastro-esophageal reflux disease without esophagitis: Secondary | ICD-10-CM | POA: Insufficient documentation

## 2013-02-24 DIAGNOSIS — E785 Hyperlipidemia, unspecified: Secondary | ICD-10-CM | POA: Insufficient documentation

## 2013-02-24 DIAGNOSIS — R0609 Other forms of dyspnea: Secondary | ICD-10-CM | POA: Insufficient documentation

## 2013-02-24 DIAGNOSIS — J90 Pleural effusion, not elsewhere classified: Secondary | ICD-10-CM | POA: Insufficient documentation

## 2013-02-24 MED ORDER — PROMETHAZINE-CODEINE 6.25-10 MG/5ML PO SYRP: 5.0000 mL | ORAL_SOLUTION | Freq: Four times a day (QID) | ORAL | Status: DC | PRN

## 2013-02-24 NOTE — Progress Notes (Signed)
Patient ID: [name] [name], male    DOB: [birth year], 78 y.o.   MRN: [id number]  HPI 10/30/10- 89 yoM never smoker, followed for pulmonary fibrosis and RML nodule, complicated by bronchiectasis, GERD and HBP. Last here June 08, 2010- note reviewed.  He continues Pulmonary Rehab. He finished clinical trial at [facility] and has now started perfenadone/ Asend trial at [facility]. With these he is getting PFTs, and xrays at [facility].  He notes that dyspnea is stable- doe on stairs, able to play golf twice weekly. Dry cough is gradually worse. Codeine cough syrup and benzonatate help.   05/01/11- 10/30/10- 75 yoM never smoker, followed for pulmonary fibrosis and RML nodule, complicated by bronchiectasis, GERD and HBP Has had flu vaccine. Still participating in pulmonary rehabilitation twice a week as he continues with a research drug protocol at [facility]. He had a negative test for reflux recently. He describes a protracted "cold" since November 1. He may have a little fever and discolored sputum at the beginning. We had sent a Z-Pak and he is now taking an Alka-Seltzer product. Over the last 6 months he has felt about the same. He notices chronic cough with deep breath but no progressive shortness of breath. He says he looked stable on a 6 minute walk test and PFT at [facility]. Spiriva did not help- it is no longer being used.  10/29/11-  75 yoM never smoker, followed for pulmonary fibrosis and RML nodule, complicated by bronchiectasis, GERD and HBP  Patient c/o "slight" sob, and cough. Denies wheezing. He finishes his second drug trial for interstitial lung disease at [facility] and is now getting "the real by mouth". The protocol includes chest x-ray, PFT and 6 minute walk test being done there so we will not repeat them. Subjectively, and from what he is told at [facility], he is basically stable with persistent cough. Cough wakes him early in the mornings . He continues to play golf regularly.  In March he had double hernia  repair. As he recovers, he will resume pulmonary rehabilitation here in July.  04/29/12- 75 yoM never smoker, followed for pulmonary fibrosis and RML nodule, complicated by bronchiectasis, GERD and HBP FOLLOWS FOR: dry cough and still having slight SOB-no worse than last OV. He is still being followed now on open label perfenadone/ Pulmonary Fibrosis trial at [facility] and says he is now getting the active drug. He is holding his own. At last office visit at [facility] in November, he desaturated to 88% on his 6 minute walk test. He continues pulmonary rehabilitation here and likes it. Had flu vaccine. Denies cardiac problems. Coughs for about one hour each morning and this is his biggest problem. Uses Tessalon Perles with Phenergan codeine taken once at night. Prilosec controls heartburn. Has had pneumonia vaccine and flu vaccine. CXR 07/13/11 IMPRESSION:  No active disease. Stable mild hyperinflation and chronic fibrotic  changes.  Original Report Authenticated By: Natasha Mead, M.D.   02/24/13- 78 yoM never smoker, followed for pulmonary fibrosis/ UIP and RML nodule, complicated by bronchiectasis, GERD and HBP FOLLOWS FOR:c/o chest congestion x 3 wks.,cough-clear,nasal drainage-clear,sob increased,occass wheezing when sleeping,denies cp or tightness,denies fcs,was at [facility] last wk gave zpak finished  Caught a cold 3 weeks ago, slowly better. Still hoarse. Less congested. Finished Z-Pak. Had chest x-ray at [facility] where he continues perfenadone f/u for UIP now x 1.5 years. these. O2 2L/ [employer] for sleep.   Review of Systems- see HPI Constitutional:   No-   weight loss,  night sweats, fevers, chills, fatigue, lassitude. HEENT:   No-  headaches, difficulty swallowing, tooth/dental problems, sore throat,       No-  sneezing, itching, ear ache, +nasal congestion, post nasal drip,  CV:  No-   chest pain, orthopnea, PND, swelling in lower extremities, anasarca, dizziness, palpitations Resp: +  shortness of breath with  exertion or at rest.              No-   productive cough,  +non-productive cough,  No- coughing up of blood.              No-   change in color of mucus.  No- wheezing.   Skin: No-   rash or lesions. GI:  No-   heartburn, indigestion, abdominal pain, nausea, vomiting, GU: ain. MS:  No-   joint pain or swelling.   Neuro-     nothing unusual Psych:  No- change in mood or affect. No depression or anxiety.  No memory loss.   Objective:   Physical Exam General- Alert, Oriented, Affect-appropriate, Distress- none acute Skin- rash-none, lesions- none, excoriation- none Lymphadenopathy- none Head- atraumatic            Eyes- Gross vision intact, PERRLA, conjunctivae clear secretions            Ears- Hearing, canals-normal            Nose- Clear, no-Septal dev, mucus, polyps, erosion, perforation             Throat- Mallampati II , mucosa clear , drainage- none, tonsils- atrophic Neck- flexible , trachea midline, no stridor , thyroid nl, carotid no bruit Chest - symmetrical excursion , unlabored           Heart/CV- RRR , no murmur , no gallop  , no rub, nl s1 s2                           - JVD- none , edema- none, stasis changes- none, varices- none           Lung- +diffuse crackles, wheeze- none, +mild dry  cough , dullness-none, rub- none,+tripod                            posture           Chest wall-  Abd-  Br/ Gen/ Rectal- Not done, not indicated Extrem- cyanosis- none, clubbing- none, atrophy- none, strength- nl Neuro- grossly intact to observation

## 2013-02-24 NOTE — Patient Instructions (Signed)
Script for cough syrup  Stay hydrated- adequate fluids to thin mucus so you can keep it coughed out  Try otc Mucinex or Mucinex -DM to help clear out mucus.  Please call as needed

## 2013-02-26 ENCOUNTER — Encounter (HOSPITAL_COMMUNITY)
Admission: RE | Admit: 2013-02-26 | Discharge: 2013-02-26 | Disposition: A | Discharge status: Home / Self Care | Payer: Self-pay | Source: Ambulatory Visit | Attending: Internal Medicine | Admitting: Internal Medicine

## 2013-03-03 ENCOUNTER — Encounter (HOSPITAL_COMMUNITY)
Admission: RE | Admit: 2013-03-03 | Discharge: 2013-03-03 | Disposition: A | Discharge status: Home / Self Care | Payer: Self-pay | Source: Ambulatory Visit | Attending: Internal Medicine | Admitting: Internal Medicine

## 2013-03-05 ENCOUNTER — Encounter (HOSPITAL_COMMUNITY)
Admission: RE | Admit: 2013-03-05 | Discharge: 2013-03-05 | Disposition: A | Discharge status: Home / Self Care | Payer: Self-pay | Source: Ambulatory Visit | Attending: Internal Medicine | Admitting: Internal Medicine

## 2013-03-08 ENCOUNTER — Encounter: Payer: Self-pay | Admitting: Internal Medicine

## 2013-03-08 NOTE — Assessment & Plan Note (Signed)
He remains on perfenadone. Oxygen dependent.

## 2013-03-08 NOTE — Assessment & Plan Note (Signed)
Viral syndrome. Plan-Mucinex DM, codeine cough syrup refilled, fluids and supportive care.  Get flu vaccine when feeling better

## 2013-03-10 ENCOUNTER — Encounter (HOSPITAL_COMMUNITY)
Admission: RE | Admit: 2013-03-10 | Discharge: 2013-03-10 | Disposition: A | Discharge status: Home / Self Care | Payer: Self-pay | Source: Ambulatory Visit | Attending: Internal Medicine | Admitting: Internal Medicine

## 2013-03-12 ENCOUNTER — Encounter (HOSPITAL_COMMUNITY): Payer: Medicare Other

## 2013-03-12 DIAGNOSIS — Z79899 Other long term (current) drug therapy: Secondary | ICD-10-CM | POA: Diagnosis not present

## 2013-03-12 DIAGNOSIS — J8410 Pulmonary fibrosis, unspecified: Secondary | ICD-10-CM | POA: Diagnosis not present

## 2013-03-12 DIAGNOSIS — Z23 Encounter for immunization: Secondary | ICD-10-CM | POA: Diagnosis not present

## 2013-03-17 ENCOUNTER — Encounter (HOSPITAL_COMMUNITY): Payer: Medicare Other

## 2013-03-19 ENCOUNTER — Encounter (HOSPITAL_COMMUNITY)
Admission: RE | Admit: 2013-03-19 | Discharge: 2013-03-19 | Disposition: A | Discharge status: Home / Self Care | Payer: Self-pay | Source: Ambulatory Visit | Attending: Internal Medicine | Admitting: Internal Medicine

## 2013-03-24 ENCOUNTER — Encounter (HOSPITAL_COMMUNITY)
Admission: RE | Admit: 2013-03-24 | Discharge: 2013-03-24 | Disposition: A | Discharge status: Home / Self Care | Payer: Self-pay | Source: Ambulatory Visit | Attending: Internal Medicine | Admitting: Internal Medicine

## 2013-03-24 DIAGNOSIS — J90 Pleural effusion, not elsewhere classified: Secondary | ICD-10-CM | POA: Insufficient documentation

## 2013-03-24 DIAGNOSIS — R05 Cough: Secondary | ICD-10-CM | POA: Insufficient documentation

## 2013-03-24 DIAGNOSIS — R0989 Other specified symptoms and signs involving the circulatory and respiratory systems: Secondary | ICD-10-CM | POA: Insufficient documentation

## 2013-03-24 DIAGNOSIS — Z5189 Encounter for other specified aftercare: Secondary | ICD-10-CM | POA: Insufficient documentation

## 2013-03-24 DIAGNOSIS — R0609 Other forms of dyspnea: Secondary | ICD-10-CM | POA: Insufficient documentation

## 2013-03-24 DIAGNOSIS — J8410 Pulmonary fibrosis, unspecified: Secondary | ICD-10-CM | POA: Insufficient documentation

## 2013-03-24 DIAGNOSIS — R059 Cough, unspecified: Secondary | ICD-10-CM | POA: Insufficient documentation

## 2013-03-24 DIAGNOSIS — E785 Hyperlipidemia, unspecified: Secondary | ICD-10-CM | POA: Insufficient documentation

## 2013-03-24 DIAGNOSIS — K219 Gastro-esophageal reflux disease without esophagitis: Secondary | ICD-10-CM | POA: Insufficient documentation

## 2013-03-24 DIAGNOSIS — I10 Essential (primary) hypertension: Secondary | ICD-10-CM | POA: Insufficient documentation

## 2013-03-26 ENCOUNTER — Encounter (HOSPITAL_COMMUNITY)
Admission: RE | Admit: 2013-03-26 | Discharge: 2013-03-26 | Disposition: A | Discharge status: Home / Self Care | Payer: Self-pay | Source: Ambulatory Visit | Attending: Internal Medicine | Admitting: Internal Medicine

## 2013-03-31 ENCOUNTER — Encounter (HOSPITAL_COMMUNITY)
Admission: RE | Admit: 2013-03-31 | Discharge: 2013-03-31 | Disposition: A | Discharge status: Home / Self Care | Payer: Self-pay | Source: Ambulatory Visit | Attending: Internal Medicine | Admitting: Internal Medicine

## 2013-04-02 ENCOUNTER — Encounter (HOSPITAL_COMMUNITY): Payer: Medicare Other

## 2013-04-07 ENCOUNTER — Encounter (HOSPITAL_COMMUNITY): Payer: Medicare Other

## 2013-04-09 ENCOUNTER — Encounter (HOSPITAL_COMMUNITY)
Admission: RE | Admit: 2013-04-09 | Discharge: 2013-04-09 | Disposition: A | Discharge status: Home / Self Care | Payer: Self-pay | Source: Ambulatory Visit | Attending: Internal Medicine | Admitting: Internal Medicine

## 2013-04-14 ENCOUNTER — Encounter (HOSPITAL_COMMUNITY)
Admission: RE | Admit: 2013-04-14 | Discharge: 2013-04-14 | Disposition: A | Discharge status: Home / Self Care | Payer: Self-pay | Source: Ambulatory Visit | Attending: Internal Medicine | Admitting: Internal Medicine

## 2013-04-16 ENCOUNTER — Encounter (HOSPITAL_COMMUNITY): Payer: Medicare Other

## 2013-04-20 DIAGNOSIS — H2510 Age-related nuclear cataract, unspecified eye: Secondary | ICD-10-CM | POA: Diagnosis not present

## 2013-04-20 DIAGNOSIS — H43819 Vitreous degeneration, unspecified eye: Secondary | ICD-10-CM | POA: Diagnosis not present

## 2013-04-20 DIAGNOSIS — H524 Presbyopia: Secondary | ICD-10-CM | POA: Diagnosis not present

## 2013-04-20 DIAGNOSIS — H5200 Hypermetropia, unspecified eye: Secondary | ICD-10-CM | POA: Diagnosis not present

## 2013-04-21 ENCOUNTER — Encounter (HOSPITAL_COMMUNITY)
Admission: RE | Admit: 2013-04-21 | Discharge: 2013-04-21 | Disposition: A | Discharge status: Home / Self Care | Payer: Self-pay | Source: Ambulatory Visit | Attending: Internal Medicine | Admitting: Internal Medicine

## 2013-04-21 DIAGNOSIS — J90 Pleural effusion, not elsewhere classified: Secondary | ICD-10-CM | POA: Insufficient documentation

## 2013-04-21 DIAGNOSIS — E785 Hyperlipidemia, unspecified: Secondary | ICD-10-CM | POA: Insufficient documentation

## 2013-04-21 DIAGNOSIS — I10 Essential (primary) hypertension: Secondary | ICD-10-CM | POA: Insufficient documentation

## 2013-04-21 DIAGNOSIS — R0609 Other forms of dyspnea: Secondary | ICD-10-CM | POA: Insufficient documentation

## 2013-04-21 DIAGNOSIS — R059 Cough, unspecified: Secondary | ICD-10-CM | POA: Insufficient documentation

## 2013-04-21 DIAGNOSIS — Z5189 Encounter for other specified aftercare: Secondary | ICD-10-CM | POA: Insufficient documentation

## 2013-04-21 DIAGNOSIS — R0989 Other specified symptoms and signs involving the circulatory and respiratory systems: Secondary | ICD-10-CM | POA: Insufficient documentation

## 2013-04-21 DIAGNOSIS — K219 Gastro-esophageal reflux disease without esophagitis: Secondary | ICD-10-CM | POA: Insufficient documentation

## 2013-04-21 DIAGNOSIS — J8410 Pulmonary fibrosis, unspecified: Secondary | ICD-10-CM | POA: Insufficient documentation

## 2013-04-21 DIAGNOSIS — R05 Cough: Secondary | ICD-10-CM | POA: Insufficient documentation

## 2013-04-23 ENCOUNTER — Encounter (HOSPITAL_COMMUNITY)
Admission: RE | Admit: 2013-04-23 | Discharge: 2013-04-23 | Disposition: A | Discharge status: Home / Self Care | Payer: Self-pay | Source: Ambulatory Visit | Attending: Internal Medicine | Admitting: Internal Medicine

## 2013-04-28 ENCOUNTER — Encounter (HOSPITAL_COMMUNITY)
Admission: RE | Admit: 2013-04-28 | Discharge: 2013-04-28 | Disposition: A | Discharge status: Home / Self Care | Payer: Self-pay | Source: Ambulatory Visit | Attending: Internal Medicine | Admitting: Internal Medicine

## 2013-04-29 ENCOUNTER — Ambulatory Visit: Payer: Medicare Other | Admitting: Internal Medicine

## 2013-04-30 ENCOUNTER — Encounter (HOSPITAL_COMMUNITY)
Admission: RE | Admit: 2013-04-30 | Discharge: 2013-04-30 | Disposition: A | Discharge status: Home / Self Care | Payer: Self-pay | Source: Ambulatory Visit | Attending: Internal Medicine | Admitting: Internal Medicine

## 2013-05-05 ENCOUNTER — Encounter (HOSPITAL_COMMUNITY)
Admission: RE | Admit: 2013-05-05 | Discharge: 2013-05-05 | Disposition: A | Discharge status: Home / Self Care | Payer: Self-pay | Source: Ambulatory Visit | Attending: Internal Medicine | Admitting: Internal Medicine

## 2013-05-07 ENCOUNTER — Encounter (HOSPITAL_COMMUNITY)
Admission: RE | Admit: 2013-05-07 | Discharge: 2013-05-07 | Disposition: A | Discharge status: Home / Self Care | Payer: Self-pay | Source: Ambulatory Visit | Attending: Internal Medicine | Admitting: Internal Medicine

## 2013-05-08 DIAGNOSIS — I10 Essential (primary) hypertension: Secondary | ICD-10-CM | POA: Diagnosis not present

## 2013-05-08 DIAGNOSIS — K219 Gastro-esophageal reflux disease without esophagitis: Secondary | ICD-10-CM | POA: Diagnosis not present

## 2013-05-08 DIAGNOSIS — Z0000 Encounter for general adult medical examination without abnormal findings: Secondary | ICD-10-CM | POA: Diagnosis not present

## 2013-05-08 DIAGNOSIS — J8410 Pulmonary fibrosis, unspecified: Secondary | ICD-10-CM | POA: Diagnosis not present

## 2013-05-08 DIAGNOSIS — E785 Hyperlipidemia, unspecified: Secondary | ICD-10-CM | POA: Diagnosis not present

## 2013-05-12 ENCOUNTER — Encounter (HOSPITAL_COMMUNITY)
Admission: RE | Admit: 2013-05-12 | Discharge: 2013-05-12 | Disposition: A | Discharge status: Home / Self Care | Payer: Self-pay | Source: Ambulatory Visit | Attending: Internal Medicine | Admitting: Internal Medicine

## 2013-05-14 ENCOUNTER — Encounter (HOSPITAL_COMMUNITY): Payer: Medicare Other

## 2013-05-19 ENCOUNTER — Encounter (HOSPITAL_COMMUNITY): Payer: Medicare Other

## 2013-06-02 ENCOUNTER — Encounter (HOSPITAL_COMMUNITY)
Admission: RE | Admit: 2013-06-02 | Discharge: 2013-06-02 | Disposition: A | Discharge status: Home / Self Care | Payer: Self-pay | Source: Ambulatory Visit | Attending: Internal Medicine | Admitting: Internal Medicine

## 2013-06-02 DIAGNOSIS — I10 Essential (primary) hypertension: Secondary | ICD-10-CM | POA: Insufficient documentation

## 2013-06-02 DIAGNOSIS — K219 Gastro-esophageal reflux disease without esophagitis: Secondary | ICD-10-CM | POA: Insufficient documentation

## 2013-06-02 DIAGNOSIS — J90 Pleural effusion, not elsewhere classified: Secondary | ICD-10-CM | POA: Insufficient documentation

## 2013-06-02 DIAGNOSIS — R0609 Other forms of dyspnea: Secondary | ICD-10-CM | POA: Insufficient documentation

## 2013-06-02 DIAGNOSIS — E785 Hyperlipidemia, unspecified: Secondary | ICD-10-CM | POA: Insufficient documentation

## 2013-06-02 DIAGNOSIS — R05 Cough: Secondary | ICD-10-CM | POA: Insufficient documentation

## 2013-06-02 DIAGNOSIS — R059 Cough, unspecified: Secondary | ICD-10-CM | POA: Insufficient documentation

## 2013-06-02 DIAGNOSIS — Z5189 Encounter for other specified aftercare: Secondary | ICD-10-CM | POA: Insufficient documentation

## 2013-06-02 DIAGNOSIS — R0989 Other specified symptoms and signs involving the circulatory and respiratory systems: Secondary | ICD-10-CM | POA: Insufficient documentation

## 2013-06-02 DIAGNOSIS — J8410 Pulmonary fibrosis, unspecified: Secondary | ICD-10-CM | POA: Insufficient documentation

## 2013-06-03 ENCOUNTER — Other Ambulatory Visit: Payer: Self-pay | Admitting: Dermatology

## 2013-06-03 DIAGNOSIS — L851 Acquired keratosis [keratoderma] palmaris et plantaris: Secondary | ICD-10-CM | POA: Diagnosis not present

## 2013-06-03 DIAGNOSIS — R21 Rash and other nonspecific skin eruption: Secondary | ICD-10-CM | POA: Diagnosis not present

## 2013-06-03 DIAGNOSIS — L270 Generalized skin eruption due to drugs and medicaments taken internally: Secondary | ICD-10-CM | POA: Diagnosis not present

## 2013-06-03 DIAGNOSIS — L578 Other skin changes due to chronic exposure to nonionizing radiation: Secondary | ICD-10-CM | POA: Diagnosis not present

## 2013-06-03 DIAGNOSIS — D485 Neoplasm of uncertain behavior of skin: Secondary | ICD-10-CM | POA: Diagnosis not present

## 2013-06-04 ENCOUNTER — Encounter (HOSPITAL_COMMUNITY)
Admission: RE | Admit: 2013-06-04 | Discharge: 2013-06-04 | Disposition: A | Discharge status: Home / Self Care | Payer: Self-pay | Source: Ambulatory Visit | Attending: Internal Medicine | Admitting: Internal Medicine

## 2013-06-09 ENCOUNTER — Encounter (HOSPITAL_COMMUNITY)
Admission: RE | Admit: 2013-06-09 | Discharge: 2013-06-09 | Disposition: A | Discharge status: Home / Self Care | Payer: Self-pay | Source: Ambulatory Visit | Attending: Internal Medicine | Admitting: Internal Medicine

## 2013-06-11 ENCOUNTER — Encounter (HOSPITAL_COMMUNITY): Payer: Self-pay

## 2013-06-11 DIAGNOSIS — J8410 Pulmonary fibrosis, unspecified: Secondary | ICD-10-CM | POA: Diagnosis not present

## 2013-06-16 ENCOUNTER — Encounter (HOSPITAL_COMMUNITY)
Admission: RE | Admit: 2013-06-16 | Discharge: 2013-06-16 | Disposition: A | Discharge status: Home / Self Care | Payer: Self-pay | Source: Ambulatory Visit | Attending: Internal Medicine | Admitting: Internal Medicine

## 2013-06-18 ENCOUNTER — Encounter (HOSPITAL_COMMUNITY)
Admission: RE | Admit: 2013-06-18 | Discharge: 2013-06-18 | Disposition: A | Discharge status: Home / Self Care | Payer: Self-pay | Source: Ambulatory Visit | Attending: Internal Medicine | Admitting: Internal Medicine

## 2013-06-22 LAB — PULMONARY FUNCTION TEST
DL/VA % pred: 53 %
DL/VA: 2.36 ml/min/mmHg/L
DLCO UNC % PRED: 27 %
DLCO UNC: 7.74 ml/min/mmHg
FEF 25-75 POST: 3.47 L/s
FEF 25-75 PRE: 2.81 L/s
FEF2575-%CHANGE-POST: 23 %
FEF2575-%PRED-PRE: 153 %
FEF2575-%Pred-Post: 189 %
FEV1-%CHANGE-POST: 5 %
FEV1-%PRED-POST: 77 %
FEV1-%PRED-PRE: 74 %
FEV1-Post: 2.02 L
FEV1-Pre: 1.92 L
FEV1FVC-%Change-Post: 3 %
FEV1FVC-%Pred-Pre: 119 %
FEV6-%CHANGE-POST: 1 %
FEV6-%PRED-PRE: 65 %
FEV6-%Pred-Post: 66 %
FEV6-Post: 2.25 L
FEV6-Pre: 2.22 L
FEV6FVC-%Change-Post: 0 %
FEV6FVC-%PRED-POST: 106 %
FEV6FVC-%Pred-Pre: 107 %
FVC-%CHANGE-POST: 1 %
FVC-%PRED-POST: 62 %
FVC-%Pred-Pre: 61 %
FVC-Post: 2.27 L
FVC-Pre: 2.23 L
PRE FEV6/FVC RATIO: 100 %
Post FEV1/FVC ratio: 89 %
Post FEV6/FVC ratio: 99 %
Pre FEV1/FVC ratio: 86 %
RV % pred: 36 %
RV: 0.89 L
TLC % PRED: 48 %
TLC: 3.1 L

## 2013-06-23 ENCOUNTER — Encounter (HOSPITAL_COMMUNITY)
Admission: RE | Admit: 2013-06-23 | Discharge: 2013-06-23 | Disposition: A | Discharge status: Home / Self Care | Payer: Self-pay | Source: Ambulatory Visit | Attending: Internal Medicine | Admitting: Internal Medicine

## 2013-06-23 DIAGNOSIS — E785 Hyperlipidemia, unspecified: Secondary | ICD-10-CM | POA: Insufficient documentation

## 2013-06-23 DIAGNOSIS — K219 Gastro-esophageal reflux disease without esophagitis: Secondary | ICD-10-CM | POA: Insufficient documentation

## 2013-06-23 DIAGNOSIS — R0989 Other specified symptoms and signs involving the circulatory and respiratory systems: Secondary | ICD-10-CM | POA: Insufficient documentation

## 2013-06-23 DIAGNOSIS — R059 Cough, unspecified: Secondary | ICD-10-CM | POA: Insufficient documentation

## 2013-06-23 DIAGNOSIS — I10 Essential (primary) hypertension: Secondary | ICD-10-CM | POA: Insufficient documentation

## 2013-06-23 DIAGNOSIS — R0609 Other forms of dyspnea: Secondary | ICD-10-CM | POA: Insufficient documentation

## 2013-06-23 DIAGNOSIS — Z5189 Encounter for other specified aftercare: Secondary | ICD-10-CM | POA: Insufficient documentation

## 2013-06-23 DIAGNOSIS — J90 Pleural effusion, not elsewhere classified: Secondary | ICD-10-CM | POA: Insufficient documentation

## 2013-06-23 DIAGNOSIS — R05 Cough: Secondary | ICD-10-CM | POA: Insufficient documentation

## 2013-06-23 DIAGNOSIS — J8410 Pulmonary fibrosis, unspecified: Secondary | ICD-10-CM | POA: Insufficient documentation

## 2013-06-25 ENCOUNTER — Encounter (HOSPITAL_COMMUNITY)
Admission: RE | Admit: 2013-06-25 | Discharge: 2013-06-25 | Disposition: A | Discharge status: Home / Self Care | Payer: Self-pay | Source: Ambulatory Visit | Attending: Internal Medicine | Admitting: Internal Medicine

## 2013-06-30 ENCOUNTER — Encounter (HOSPITAL_COMMUNITY)
Admission: RE | Admit: 2013-06-30 | Discharge: 2013-06-30 | Disposition: A | Discharge status: Home / Self Care | Payer: Self-pay | Source: Ambulatory Visit | Attending: Internal Medicine | Admitting: Internal Medicine

## 2013-07-01 DIAGNOSIS — J8410 Pulmonary fibrosis, unspecified: Secondary | ICD-10-CM | POA: Diagnosis not present

## 2013-07-01 DIAGNOSIS — R21 Rash and other nonspecific skin eruption: Secondary | ICD-10-CM | POA: Diagnosis not present

## 2013-07-02 ENCOUNTER — Encounter (HOSPITAL_COMMUNITY)
Admission: RE | Admit: 2013-07-02 | Discharge: 2013-07-02 | Disposition: A | Discharge status: Home / Self Care | Payer: Self-pay | Source: Ambulatory Visit | Attending: Internal Medicine | Admitting: Internal Medicine

## 2013-07-07 ENCOUNTER — Encounter (HOSPITAL_COMMUNITY): Payer: Self-pay

## 2013-07-09 ENCOUNTER — Encounter (HOSPITAL_COMMUNITY): Payer: Self-pay

## 2013-07-14 ENCOUNTER — Encounter (HOSPITAL_COMMUNITY): Payer: Self-pay

## 2013-07-16 ENCOUNTER — Encounter (HOSPITAL_COMMUNITY): Payer: Self-pay

## 2013-07-21 ENCOUNTER — Encounter (HOSPITAL_COMMUNITY)
Admission: RE | Admit: 2013-07-21 | Discharge: 2013-07-21 | Disposition: A | Discharge status: Home / Self Care | Payer: Self-pay | Source: Ambulatory Visit | Attending: Internal Medicine | Admitting: Internal Medicine

## 2013-07-21 DIAGNOSIS — J90 Pleural effusion, not elsewhere classified: Secondary | ICD-10-CM | POA: Insufficient documentation

## 2013-07-21 DIAGNOSIS — J8410 Pulmonary fibrosis, unspecified: Secondary | ICD-10-CM | POA: Insufficient documentation

## 2013-07-21 DIAGNOSIS — R0989 Other specified symptoms and signs involving the circulatory and respiratory systems: Secondary | ICD-10-CM | POA: Insufficient documentation

## 2013-07-21 DIAGNOSIS — E785 Hyperlipidemia, unspecified: Secondary | ICD-10-CM | POA: Insufficient documentation

## 2013-07-21 DIAGNOSIS — R0609 Other forms of dyspnea: Secondary | ICD-10-CM | POA: Insufficient documentation

## 2013-07-21 DIAGNOSIS — Z5189 Encounter for other specified aftercare: Secondary | ICD-10-CM | POA: Insufficient documentation

## 2013-07-21 DIAGNOSIS — K219 Gastro-esophageal reflux disease without esophagitis: Secondary | ICD-10-CM | POA: Insufficient documentation

## 2013-07-21 DIAGNOSIS — I10 Essential (primary) hypertension: Secondary | ICD-10-CM | POA: Insufficient documentation

## 2013-07-21 DIAGNOSIS — R059 Cough, unspecified: Secondary | ICD-10-CM | POA: Insufficient documentation

## 2013-07-21 DIAGNOSIS — R05 Cough: Secondary | ICD-10-CM | POA: Insufficient documentation

## 2013-07-22 DIAGNOSIS — H1130 Conjunctival hemorrhage, unspecified eye: Secondary | ICD-10-CM | POA: Diagnosis not present

## 2013-07-23 ENCOUNTER — Encounter (HOSPITAL_COMMUNITY)
Admission: RE | Admit: 2013-07-23 | Discharge: 2013-07-23 | Disposition: A | Discharge status: Home / Self Care | Payer: Self-pay | Source: Ambulatory Visit | Attending: Internal Medicine | Admitting: Internal Medicine

## 2013-07-27 DIAGNOSIS — L988 Other specified disorders of the skin and subcutaneous tissue: Secondary | ICD-10-CM | POA: Diagnosis not present

## 2013-07-28 ENCOUNTER — Encounter (HOSPITAL_COMMUNITY)
Admission: RE | Admit: 2013-07-28 | Discharge: 2013-07-28 | Disposition: A | Discharge status: Home / Self Care | Payer: Self-pay | Source: Ambulatory Visit | Attending: Internal Medicine | Admitting: Internal Medicine

## 2013-07-30 ENCOUNTER — Encounter (HOSPITAL_COMMUNITY)
Admission: RE | Admit: 2013-07-30 | Discharge: 2013-07-30 | Disposition: A | Discharge status: Home / Self Care | Payer: Self-pay | Source: Ambulatory Visit | Attending: Internal Medicine | Admitting: Internal Medicine

## 2013-08-04 ENCOUNTER — Encounter (HOSPITAL_COMMUNITY)
Admission: RE | Admit: 2013-08-04 | Discharge: 2013-08-04 | Disposition: A | Discharge status: Home / Self Care | Payer: Self-pay | Source: Ambulatory Visit | Attending: Internal Medicine | Admitting: Internal Medicine

## 2013-08-06 ENCOUNTER — Encounter (HOSPITAL_COMMUNITY): Payer: Self-pay

## 2013-08-10 DIAGNOSIS — Z8551 Personal history of malignant neoplasm of bladder: Secondary | ICD-10-CM | POA: Diagnosis not present

## 2013-08-10 DIAGNOSIS — R3911 Hesitancy of micturition: Secondary | ICD-10-CM | POA: Diagnosis not present

## 2013-08-10 DIAGNOSIS — N401 Enlarged prostate with lower urinary tract symptoms: Secondary | ICD-10-CM | POA: Diagnosis not present

## 2013-08-10 DIAGNOSIS — N400 Enlarged prostate without lower urinary tract symptoms: Secondary | ICD-10-CM | POA: Diagnosis not present

## 2013-08-11 ENCOUNTER — Encounter (HOSPITAL_COMMUNITY)
Admission: RE | Admit: 2013-08-11 | Discharge: 2013-08-11 | Disposition: A | Discharge status: Home / Self Care | Payer: Self-pay | Source: Ambulatory Visit | Attending: Internal Medicine | Admitting: Internal Medicine

## 2013-08-13 ENCOUNTER — Encounter (HOSPITAL_COMMUNITY)
Admission: RE | Admit: 2013-08-13 | Discharge: 2013-08-13 | Disposition: A | Discharge status: Home / Self Care | Payer: Self-pay | Source: Ambulatory Visit | Attending: Internal Medicine | Admitting: Internal Medicine

## 2013-08-18 ENCOUNTER — Encounter (HOSPITAL_COMMUNITY)
Admission: RE | Admit: 2013-08-18 | Discharge: 2013-08-18 | Disposition: A | Discharge status: Home / Self Care | Payer: Self-pay | Source: Ambulatory Visit | Attending: Internal Medicine | Admitting: Internal Medicine

## 2013-08-20 ENCOUNTER — Encounter (HOSPITAL_COMMUNITY)
Admission: RE | Admit: 2013-08-20 | Discharge: 2013-08-20 | Disposition: A | Discharge status: Home / Self Care | Payer: Self-pay | Source: Ambulatory Visit | Attending: Internal Medicine | Admitting: Internal Medicine

## 2013-08-20 DIAGNOSIS — I10 Essential (primary) hypertension: Secondary | ICD-10-CM | POA: Insufficient documentation

## 2013-08-20 DIAGNOSIS — E785 Hyperlipidemia, unspecified: Secondary | ICD-10-CM | POA: Insufficient documentation

## 2013-08-20 DIAGNOSIS — K219 Gastro-esophageal reflux disease without esophagitis: Secondary | ICD-10-CM | POA: Insufficient documentation

## 2013-08-20 DIAGNOSIS — R0609 Other forms of dyspnea: Secondary | ICD-10-CM | POA: Insufficient documentation

## 2013-08-20 DIAGNOSIS — R059 Cough, unspecified: Secondary | ICD-10-CM | POA: Insufficient documentation

## 2013-08-20 DIAGNOSIS — J90 Pleural effusion, not elsewhere classified: Secondary | ICD-10-CM | POA: Insufficient documentation

## 2013-08-20 DIAGNOSIS — R0989 Other specified symptoms and signs involving the circulatory and respiratory systems: Secondary | ICD-10-CM | POA: Insufficient documentation

## 2013-08-20 DIAGNOSIS — Z5189 Encounter for other specified aftercare: Secondary | ICD-10-CM | POA: Insufficient documentation

## 2013-08-20 DIAGNOSIS — R05 Cough: Secondary | ICD-10-CM | POA: Insufficient documentation

## 2013-08-20 DIAGNOSIS — J8410 Pulmonary fibrosis, unspecified: Secondary | ICD-10-CM | POA: Insufficient documentation

## 2013-08-25 ENCOUNTER — Encounter (HOSPITAL_COMMUNITY)
Admission: RE | Admit: 2013-08-25 | Discharge: 2013-08-25 | Disposition: A | Discharge status: Home / Self Care | Payer: Self-pay | Source: Ambulatory Visit | Attending: Internal Medicine | Admitting: Internal Medicine

## 2013-08-27 ENCOUNTER — Encounter (HOSPITAL_COMMUNITY)
Admission: RE | Admit: 2013-08-27 | Discharge: 2013-08-27 | Disposition: A | Discharge status: Home / Self Care | Payer: Self-pay | Source: Ambulatory Visit | Attending: Internal Medicine | Admitting: Internal Medicine

## 2013-09-01 ENCOUNTER — Encounter (HOSPITAL_COMMUNITY): Payer: Self-pay

## 2013-09-01 DIAGNOSIS — Z79899 Other long term (current) drug therapy: Secondary | ICD-10-CM | POA: Diagnosis not present

## 2013-09-01 DIAGNOSIS — J8410 Pulmonary fibrosis, unspecified: Secondary | ICD-10-CM | POA: Diagnosis not present

## 2013-09-01 DIAGNOSIS — I498 Other specified cardiac arrhythmias: Secondary | ICD-10-CM | POA: Diagnosis not present

## 2013-09-01 DIAGNOSIS — Z23 Encounter for immunization: Secondary | ICD-10-CM | POA: Diagnosis not present

## 2013-09-03 ENCOUNTER — Encounter (HOSPITAL_COMMUNITY)
Admission: RE | Admit: 2013-09-03 | Discharge: 2013-09-03 | Disposition: A | Discharge status: Home / Self Care | Payer: Self-pay | Source: Ambulatory Visit | Attending: Internal Medicine | Admitting: Internal Medicine

## 2013-09-07 DIAGNOSIS — L299 Pruritus, unspecified: Secondary | ICD-10-CM | POA: Diagnosis not present

## 2013-09-07 DIAGNOSIS — L988 Other specified disorders of the skin and subcutaneous tissue: Secondary | ICD-10-CM | POA: Diagnosis not present

## 2013-09-08 ENCOUNTER — Encounter (HOSPITAL_COMMUNITY)
Admission: RE | Admit: 2013-09-08 | Discharge: 2013-09-08 | Disposition: A | Discharge status: Home / Self Care | Payer: Self-pay | Source: Ambulatory Visit | Attending: Internal Medicine | Admitting: Internal Medicine

## 2013-09-10 ENCOUNTER — Encounter (HOSPITAL_COMMUNITY)
Admission: RE | Admit: 2013-09-10 | Discharge: 2013-09-10 | Disposition: A | Discharge status: Home / Self Care | Payer: Self-pay | Source: Ambulatory Visit | Attending: Internal Medicine | Admitting: Internal Medicine

## 2013-09-15 ENCOUNTER — Encounter (HOSPITAL_COMMUNITY)
Admission: RE | Admit: 2013-09-15 | Discharge: 2013-09-15 | Disposition: A | Discharge status: Home / Self Care | Payer: Self-pay | Source: Ambulatory Visit | Attending: Internal Medicine | Admitting: Internal Medicine

## 2013-09-17 ENCOUNTER — Encounter (HOSPITAL_COMMUNITY)
Admission: RE | Admit: 2013-09-17 | Discharge: 2013-09-17 | Disposition: A | Discharge status: Home / Self Care | Payer: Self-pay | Source: Ambulatory Visit | Attending: Internal Medicine | Admitting: Internal Medicine

## 2013-09-22 ENCOUNTER — Encounter (HOSPITAL_COMMUNITY)
Admission: RE | Admit: 2013-09-22 | Discharge: 2013-09-22 | Disposition: A | Discharge status: Home / Self Care | Payer: Self-pay | Source: Ambulatory Visit | Attending: Internal Medicine | Admitting: Internal Medicine

## 2013-09-22 DIAGNOSIS — R0609 Other forms of dyspnea: Secondary | ICD-10-CM | POA: Insufficient documentation

## 2013-09-22 DIAGNOSIS — Z5189 Encounter for other specified aftercare: Secondary | ICD-10-CM | POA: Insufficient documentation

## 2013-09-22 DIAGNOSIS — J90 Pleural effusion, not elsewhere classified: Secondary | ICD-10-CM | POA: Insufficient documentation

## 2013-09-22 DIAGNOSIS — J8410 Pulmonary fibrosis, unspecified: Secondary | ICD-10-CM | POA: Insufficient documentation

## 2013-09-22 DIAGNOSIS — R05 Cough: Secondary | ICD-10-CM | POA: Insufficient documentation

## 2013-09-22 DIAGNOSIS — E785 Hyperlipidemia, unspecified: Secondary | ICD-10-CM | POA: Insufficient documentation

## 2013-09-22 DIAGNOSIS — R0989 Other specified symptoms and signs involving the circulatory and respiratory systems: Secondary | ICD-10-CM | POA: Insufficient documentation

## 2013-09-22 DIAGNOSIS — I10 Essential (primary) hypertension: Secondary | ICD-10-CM | POA: Insufficient documentation

## 2013-09-22 DIAGNOSIS — K219 Gastro-esophageal reflux disease without esophagitis: Secondary | ICD-10-CM | POA: Insufficient documentation

## 2013-09-22 DIAGNOSIS — R059 Cough, unspecified: Secondary | ICD-10-CM | POA: Insufficient documentation

## 2013-09-24 ENCOUNTER — Encounter (HOSPITAL_COMMUNITY)
Admission: RE | Admit: 2013-09-24 | Discharge: 2013-09-24 | Disposition: A | Discharge status: Home / Self Care | Payer: Self-pay | Source: Ambulatory Visit | Attending: Internal Medicine | Admitting: Internal Medicine

## 2013-09-29 ENCOUNTER — Encounter (HOSPITAL_COMMUNITY): Payer: Self-pay

## 2013-10-01 ENCOUNTER — Encounter (HOSPITAL_COMMUNITY): Payer: Self-pay

## 2013-10-06 ENCOUNTER — Encounter (HOSPITAL_COMMUNITY): Payer: Self-pay

## 2013-10-08 ENCOUNTER — Encounter (HOSPITAL_COMMUNITY): Payer: Self-pay

## 2013-10-13 ENCOUNTER — Encounter (HOSPITAL_COMMUNITY)
Admission: RE | Admit: 2013-10-13 | Discharge: 2013-10-13 | Disposition: A | Discharge status: Home / Self Care | Payer: Self-pay | Source: Ambulatory Visit | Attending: Internal Medicine | Admitting: Internal Medicine

## 2013-10-15 ENCOUNTER — Encounter (HOSPITAL_COMMUNITY)
Admission: RE | Admit: 2013-10-15 | Discharge: 2013-10-15 | Disposition: A | Discharge status: Home / Self Care | Payer: Self-pay | Source: Ambulatory Visit | Attending: Internal Medicine | Admitting: Internal Medicine

## 2013-10-20 ENCOUNTER — Encounter (HOSPITAL_COMMUNITY)
Admission: RE | Admit: 2013-10-20 | Discharge: 2013-10-20 | Disposition: A | Discharge status: Home / Self Care | Payer: Self-pay | Source: Ambulatory Visit | Attending: Internal Medicine | Admitting: Internal Medicine

## 2013-10-20 DIAGNOSIS — R059 Cough, unspecified: Secondary | ICD-10-CM | POA: Insufficient documentation

## 2013-10-20 DIAGNOSIS — R0989 Other specified symptoms and signs involving the circulatory and respiratory systems: Secondary | ICD-10-CM | POA: Insufficient documentation

## 2013-10-20 DIAGNOSIS — R0609 Other forms of dyspnea: Secondary | ICD-10-CM | POA: Insufficient documentation

## 2013-10-20 DIAGNOSIS — J8410 Pulmonary fibrosis, unspecified: Secondary | ICD-10-CM | POA: Insufficient documentation

## 2013-10-20 DIAGNOSIS — I10 Essential (primary) hypertension: Secondary | ICD-10-CM | POA: Insufficient documentation

## 2013-10-20 DIAGNOSIS — J90 Pleural effusion, not elsewhere classified: Secondary | ICD-10-CM | POA: Insufficient documentation

## 2013-10-20 DIAGNOSIS — E785 Hyperlipidemia, unspecified: Secondary | ICD-10-CM | POA: Insufficient documentation

## 2013-10-20 DIAGNOSIS — Z5189 Encounter for other specified aftercare: Secondary | ICD-10-CM | POA: Insufficient documentation

## 2013-10-20 DIAGNOSIS — R05 Cough: Secondary | ICD-10-CM | POA: Insufficient documentation

## 2013-10-20 DIAGNOSIS — K219 Gastro-esophageal reflux disease without esophagitis: Secondary | ICD-10-CM | POA: Insufficient documentation

## 2013-10-22 ENCOUNTER — Encounter (HOSPITAL_COMMUNITY)
Admission: RE | Admit: 2013-10-22 | Discharge: 2013-10-22 | Disposition: A | Discharge status: Home / Self Care | Payer: Self-pay | Source: Ambulatory Visit | Attending: Internal Medicine | Admitting: Internal Medicine

## 2013-10-27 ENCOUNTER — Encounter (HOSPITAL_COMMUNITY)
Admission: RE | Admit: 2013-10-27 | Discharge: 2013-10-27 | Disposition: A | Discharge status: Home / Self Care | Payer: Self-pay | Source: Ambulatory Visit | Attending: Internal Medicine | Admitting: Internal Medicine

## 2013-10-29 ENCOUNTER — Encounter (HOSPITAL_COMMUNITY)
Admission: RE | Admit: 2013-10-29 | Discharge: 2013-10-29 | Disposition: A | Discharge status: Home / Self Care | Payer: Self-pay | Source: Ambulatory Visit | Attending: Internal Medicine | Admitting: Internal Medicine

## 2013-11-03 ENCOUNTER — Encounter (HOSPITAL_COMMUNITY)
Admission: RE | Admit: 2013-11-03 | Discharge: 2013-11-03 | Disposition: A | Discharge status: Home / Self Care | Payer: Self-pay | Source: Ambulatory Visit | Attending: Internal Medicine | Admitting: Internal Medicine

## 2013-11-05 ENCOUNTER — Encounter (HOSPITAL_COMMUNITY)
Admission: RE | Admit: 2013-11-05 | Discharge: 2013-11-05 | Disposition: A | Discharge status: Home / Self Care | Payer: Self-pay | Source: Ambulatory Visit | Attending: Internal Medicine | Admitting: Internal Medicine

## 2013-11-10 ENCOUNTER — Encounter (HOSPITAL_COMMUNITY)
Admission: RE | Admit: 2013-11-10 | Discharge: 2013-11-10 | Disposition: A | Discharge status: Home / Self Care | Payer: Self-pay | Source: Ambulatory Visit | Attending: Internal Medicine | Admitting: Internal Medicine

## 2013-11-12 ENCOUNTER — Encounter (HOSPITAL_COMMUNITY): Payer: Self-pay

## 2013-11-17 ENCOUNTER — Encounter (HOSPITAL_COMMUNITY)
Admission: RE | Admit: 2013-11-17 | Discharge: 2013-11-17 | Disposition: A | Discharge status: Home / Self Care | Payer: Self-pay | Source: Ambulatory Visit | Attending: Internal Medicine | Admitting: Internal Medicine

## 2013-11-19 ENCOUNTER — Encounter (HOSPITAL_COMMUNITY)
Admission: RE | Admit: 2013-11-19 | Discharge: 2013-11-19 | Disposition: A | Discharge status: Home / Self Care | Payer: Self-pay | Source: Ambulatory Visit | Attending: Internal Medicine | Admitting: Internal Medicine

## 2013-11-19 DIAGNOSIS — R059 Cough, unspecified: Secondary | ICD-10-CM | POA: Insufficient documentation

## 2013-11-19 DIAGNOSIS — K219 Gastro-esophageal reflux disease without esophagitis: Secondary | ICD-10-CM | POA: Insufficient documentation

## 2013-11-19 DIAGNOSIS — J90 Pleural effusion, not elsewhere classified: Secondary | ICD-10-CM | POA: Insufficient documentation

## 2013-11-19 DIAGNOSIS — R0609 Other forms of dyspnea: Secondary | ICD-10-CM | POA: Insufficient documentation

## 2013-11-19 DIAGNOSIS — Z5189 Encounter for other specified aftercare: Secondary | ICD-10-CM | POA: Insufficient documentation

## 2013-11-19 DIAGNOSIS — J8410 Pulmonary fibrosis, unspecified: Secondary | ICD-10-CM | POA: Insufficient documentation

## 2013-11-19 DIAGNOSIS — R05 Cough: Secondary | ICD-10-CM | POA: Insufficient documentation

## 2013-11-19 DIAGNOSIS — E785 Hyperlipidemia, unspecified: Secondary | ICD-10-CM | POA: Insufficient documentation

## 2013-11-19 DIAGNOSIS — R0989 Other specified symptoms and signs involving the circulatory and respiratory systems: Secondary | ICD-10-CM | POA: Insufficient documentation

## 2013-11-19 DIAGNOSIS — I10 Essential (primary) hypertension: Secondary | ICD-10-CM | POA: Insufficient documentation

## 2013-11-24 ENCOUNTER — Encounter (HOSPITAL_COMMUNITY)
Admission: RE | Admit: 2013-11-24 | Discharge: 2013-11-24 | Disposition: A | Discharge status: Home / Self Care | Payer: Self-pay | Source: Ambulatory Visit | Attending: Internal Medicine | Admitting: Internal Medicine

## 2013-11-26 ENCOUNTER — Encounter (HOSPITAL_COMMUNITY)
Admission: RE | Admit: 2013-11-26 | Discharge: 2013-11-26 | Disposition: A | Discharge status: Home / Self Care | Payer: Self-pay | Source: Ambulatory Visit | Attending: Internal Medicine | Admitting: Internal Medicine

## 2013-12-01 ENCOUNTER — Encounter (HOSPITAL_COMMUNITY): Payer: Self-pay

## 2013-12-01 DIAGNOSIS — J8410 Pulmonary fibrosis, unspecified: Secondary | ICD-10-CM | POA: Diagnosis not present

## 2013-12-03 ENCOUNTER — Encounter (HOSPITAL_COMMUNITY)
Admission: RE | Admit: 2013-12-03 | Discharge: 2013-12-03 | Disposition: A | Discharge status: Home / Self Care | Payer: Self-pay | Source: Ambulatory Visit | Attending: Internal Medicine | Admitting: Internal Medicine

## 2013-12-08 ENCOUNTER — Encounter (HOSPITAL_COMMUNITY)
Admission: RE | Admit: 2013-12-08 | Discharge: 2013-12-08 | Disposition: A | Discharge status: Home / Self Care | Payer: Self-pay | Source: Ambulatory Visit | Attending: Internal Medicine | Admitting: Internal Medicine

## 2013-12-10 ENCOUNTER — Encounter (HOSPITAL_COMMUNITY): Payer: Self-pay

## 2013-12-15 ENCOUNTER — Encounter (HOSPITAL_COMMUNITY): Payer: Self-pay

## 2013-12-17 ENCOUNTER — Encounter (HOSPITAL_COMMUNITY): Payer: Self-pay

## 2013-12-22 ENCOUNTER — Encounter (HOSPITAL_COMMUNITY)
Admission: RE | Admit: 2013-12-22 | Discharge: 2013-12-22 | Disposition: A | Discharge status: Home / Self Care | Payer: Self-pay | Source: Ambulatory Visit | Attending: Internal Medicine | Admitting: Internal Medicine

## 2013-12-22 DIAGNOSIS — R0989 Other specified symptoms and signs involving the circulatory and respiratory systems: Secondary | ICD-10-CM | POA: Insufficient documentation

## 2013-12-22 DIAGNOSIS — R0609 Other forms of dyspnea: Secondary | ICD-10-CM | POA: Insufficient documentation

## 2013-12-22 DIAGNOSIS — K219 Gastro-esophageal reflux disease without esophagitis: Secondary | ICD-10-CM | POA: Insufficient documentation

## 2013-12-22 DIAGNOSIS — J8410 Pulmonary fibrosis, unspecified: Secondary | ICD-10-CM | POA: Insufficient documentation

## 2013-12-22 DIAGNOSIS — Z5189 Encounter for other specified aftercare: Secondary | ICD-10-CM | POA: Insufficient documentation

## 2013-12-22 DIAGNOSIS — E785 Hyperlipidemia, unspecified: Secondary | ICD-10-CM | POA: Insufficient documentation

## 2013-12-22 DIAGNOSIS — R05 Cough: Secondary | ICD-10-CM | POA: Insufficient documentation

## 2013-12-22 DIAGNOSIS — R059 Cough, unspecified: Secondary | ICD-10-CM | POA: Insufficient documentation

## 2013-12-22 DIAGNOSIS — I10 Essential (primary) hypertension: Secondary | ICD-10-CM | POA: Insufficient documentation

## 2013-12-22 DIAGNOSIS — J90 Pleural effusion, not elsewhere classified: Secondary | ICD-10-CM | POA: Insufficient documentation

## 2013-12-24 ENCOUNTER — Encounter (HOSPITAL_COMMUNITY)
Admission: RE | Admit: 2013-12-24 | Discharge: 2013-12-24 | Disposition: A | Discharge status: Home / Self Care | Payer: Self-pay | Source: Ambulatory Visit | Attending: Internal Medicine | Admitting: Internal Medicine

## 2013-12-29 ENCOUNTER — Encounter (HOSPITAL_COMMUNITY)
Admission: RE | Admit: 2013-12-29 | Discharge: 2013-12-29 | Disposition: A | Discharge status: Home / Self Care | Payer: Self-pay | Source: Ambulatory Visit | Attending: Internal Medicine | Admitting: Internal Medicine

## 2013-12-31 ENCOUNTER — Encounter (HOSPITAL_COMMUNITY)
Admission: RE | Admit: 2013-12-31 | Discharge: 2013-12-31 | Disposition: A | Discharge status: Home / Self Care | Payer: Self-pay | Source: Ambulatory Visit | Attending: Internal Medicine | Admitting: Internal Medicine

## 2014-01-05 ENCOUNTER — Encounter (HOSPITAL_COMMUNITY)
Admission: RE | Admit: 2014-01-05 | Discharge: 2014-01-05 | Disposition: A | Discharge status: Home / Self Care | Payer: Self-pay | Source: Ambulatory Visit | Attending: Internal Medicine | Admitting: Internal Medicine

## 2014-01-07 ENCOUNTER — Encounter (HOSPITAL_COMMUNITY)
Admission: RE | Admit: 2014-01-07 | Discharge: 2014-01-07 | Disposition: A | Discharge status: Home / Self Care | Payer: Self-pay | Source: Ambulatory Visit | Attending: Internal Medicine | Admitting: Internal Medicine

## 2014-01-12 ENCOUNTER — Encounter (HOSPITAL_COMMUNITY): Payer: Self-pay

## 2014-01-14 ENCOUNTER — Encounter (HOSPITAL_COMMUNITY)
Admission: RE | Admit: 2014-01-14 | Discharge: 2014-01-14 | Disposition: A | Discharge status: Home / Self Care | Payer: Self-pay | Source: Ambulatory Visit | Attending: Internal Medicine | Admitting: Internal Medicine

## 2014-01-19 ENCOUNTER — Encounter (HOSPITAL_COMMUNITY)
Admission: RE | Admit: 2014-01-19 | Discharge: 2014-01-19 | Disposition: A | Discharge status: Home / Self Care | Payer: Self-pay | Source: Ambulatory Visit | Attending: Internal Medicine | Admitting: Internal Medicine

## 2014-01-19 DIAGNOSIS — R059 Cough, unspecified: Secondary | ICD-10-CM | POA: Insufficient documentation

## 2014-01-19 DIAGNOSIS — Z5189 Encounter for other specified aftercare: Secondary | ICD-10-CM | POA: Insufficient documentation

## 2014-01-19 DIAGNOSIS — J8410 Pulmonary fibrosis, unspecified: Secondary | ICD-10-CM | POA: Insufficient documentation

## 2014-01-19 DIAGNOSIS — R0989 Other specified symptoms and signs involving the circulatory and respiratory systems: Secondary | ICD-10-CM | POA: Insufficient documentation

## 2014-01-19 DIAGNOSIS — R0609 Other forms of dyspnea: Secondary | ICD-10-CM | POA: Insufficient documentation

## 2014-01-19 DIAGNOSIS — R05 Cough: Secondary | ICD-10-CM | POA: Insufficient documentation

## 2014-01-19 DIAGNOSIS — I10 Essential (primary) hypertension: Secondary | ICD-10-CM | POA: Insufficient documentation

## 2014-01-19 DIAGNOSIS — J90 Pleural effusion, not elsewhere classified: Secondary | ICD-10-CM | POA: Insufficient documentation

## 2014-01-19 DIAGNOSIS — E785 Hyperlipidemia, unspecified: Secondary | ICD-10-CM | POA: Insufficient documentation

## 2014-01-19 DIAGNOSIS — K219 Gastro-esophageal reflux disease without esophagitis: Secondary | ICD-10-CM | POA: Insufficient documentation

## 2014-01-21 ENCOUNTER — Encounter (HOSPITAL_COMMUNITY)
Admission: RE | Admit: 2014-01-21 | Discharge: 2014-01-21 | Disposition: A | Discharge status: Home / Self Care | Payer: Self-pay | Source: Ambulatory Visit | Attending: Internal Medicine | Admitting: Internal Medicine

## 2014-01-26 ENCOUNTER — Encounter (HOSPITAL_COMMUNITY)
Admission: RE | Admit: 2014-01-26 | Discharge: 2014-01-26 | Disposition: A | Discharge status: Home / Self Care | Payer: Self-pay | Source: Ambulatory Visit | Attending: Internal Medicine | Admitting: Internal Medicine

## 2014-01-28 ENCOUNTER — Encounter (HOSPITAL_COMMUNITY)
Admission: RE | Admit: 2014-01-28 | Discharge: 2014-01-28 | Disposition: A | Discharge status: Home / Self Care | Payer: Self-pay | Source: Ambulatory Visit | Attending: Internal Medicine | Admitting: Internal Medicine

## 2014-01-29 DIAGNOSIS — N139 Obstructive and reflux uropathy, unspecified: Secondary | ICD-10-CM | POA: Diagnosis not present

## 2014-01-29 DIAGNOSIS — N401 Enlarged prostate with lower urinary tract symptoms: Secondary | ICD-10-CM | POA: Diagnosis not present

## 2014-01-29 DIAGNOSIS — Z8551 Personal history of malignant neoplasm of bladder: Secondary | ICD-10-CM | POA: Diagnosis not present

## 2014-01-29 DIAGNOSIS — N138 Other obstructive and reflux uropathy: Secondary | ICD-10-CM | POA: Diagnosis not present

## 2014-01-29 DIAGNOSIS — N529 Male erectile dysfunction, unspecified: Secondary | ICD-10-CM | POA: Diagnosis not present

## 2014-02-02 ENCOUNTER — Encounter (HOSPITAL_COMMUNITY)
Admission: RE | Admit: 2014-02-02 | Discharge: 2014-02-02 | Disposition: A | Discharge status: Home / Self Care | Payer: Self-pay | Source: Ambulatory Visit | Attending: Internal Medicine | Admitting: Internal Medicine

## 2014-02-03 ENCOUNTER — Other Ambulatory Visit: Payer: Self-pay | Admitting: Physician Assistant

## 2014-02-03 DIAGNOSIS — L570 Actinic keratosis: Secondary | ICD-10-CM | POA: Diagnosis not present

## 2014-02-03 DIAGNOSIS — D485 Neoplasm of uncertain behavior of skin: Secondary | ICD-10-CM | POA: Diagnosis not present

## 2014-02-03 DIAGNOSIS — D0439 Carcinoma in situ of skin of other parts of face: Secondary | ICD-10-CM | POA: Diagnosis not present

## 2014-02-03 DIAGNOSIS — D0430 Carcinoma in situ of skin of unspecified part of face: Secondary | ICD-10-CM | POA: Diagnosis not present

## 2014-02-04 ENCOUNTER — Encounter (HOSPITAL_COMMUNITY)
Admission: RE | Admit: 2014-02-04 | Discharge: 2014-02-04 | Disposition: A | Discharge status: Home / Self Care | Payer: Self-pay | Source: Ambulatory Visit | Attending: Internal Medicine | Admitting: Internal Medicine

## 2014-02-09 ENCOUNTER — Encounter (HOSPITAL_COMMUNITY): Payer: Self-pay

## 2014-02-11 ENCOUNTER — Encounter (HOSPITAL_COMMUNITY): Payer: Self-pay

## 2014-02-16 ENCOUNTER — Encounter (HOSPITAL_COMMUNITY)
Admission: RE | Admit: 2014-02-16 | Discharge: 2014-02-16 | Disposition: A | Discharge status: Home / Self Care | Payer: Self-pay | Source: Ambulatory Visit | Attending: Internal Medicine | Admitting: Internal Medicine

## 2014-02-18 ENCOUNTER — Encounter (HOSPITAL_COMMUNITY)
Admission: RE | Admit: 2014-02-18 | Discharge: 2014-02-18 | Disposition: A | Discharge status: Home / Self Care | Payer: Self-pay | Source: Ambulatory Visit | Attending: Internal Medicine | Admitting: Internal Medicine

## 2014-02-18 DIAGNOSIS — I10 Essential (primary) hypertension: Secondary | ICD-10-CM | POA: Insufficient documentation

## 2014-02-18 DIAGNOSIS — J90 Pleural effusion, not elsewhere classified: Secondary | ICD-10-CM | POA: Insufficient documentation

## 2014-02-18 DIAGNOSIS — E785 Hyperlipidemia, unspecified: Secondary | ICD-10-CM | POA: Insufficient documentation

## 2014-02-18 DIAGNOSIS — K219 Gastro-esophageal reflux disease without esophagitis: Secondary | ICD-10-CM | POA: Insufficient documentation

## 2014-02-18 DIAGNOSIS — J8410 Pulmonary fibrosis, unspecified: Secondary | ICD-10-CM | POA: Insufficient documentation

## 2014-02-18 DIAGNOSIS — R05 Cough: Secondary | ICD-10-CM | POA: Insufficient documentation

## 2014-02-18 DIAGNOSIS — R0600 Dyspnea, unspecified: Secondary | ICD-10-CM | POA: Insufficient documentation

## 2014-02-18 DIAGNOSIS — Z5189 Encounter for other specified aftercare: Secondary | ICD-10-CM | POA: Insufficient documentation

## 2014-02-23 ENCOUNTER — Encounter (HOSPITAL_COMMUNITY)
Admission: RE | Admit: 2014-02-23 | Discharge: 2014-02-23 | Disposition: A | Discharge status: Home / Self Care | Payer: Self-pay | Source: Ambulatory Visit | Attending: Internal Medicine | Admitting: Internal Medicine

## 2014-02-24 ENCOUNTER — Ambulatory Visit: Payer: Medicare Other | Admitting: Internal Medicine

## 2014-02-24 ENCOUNTER — Encounter: Payer: Self-pay | Admitting: Internal Medicine

## 2014-02-24 VITALS — BP 152/82 | HR 61

## 2014-02-24 DIAGNOSIS — J8410 Pulmonary fibrosis, unspecified: Secondary | ICD-10-CM

## 2014-02-24 MED ORDER — GABAPENTIN 300 MG PO CAPS: ORAL_CAPSULE | ORAL | Status: DC

## 2014-02-24 NOTE — Patient Instructions (Addendum)
Script for gabapentin to try for cough if ok with your pulmonologist at [facility] with 300 mg once daily x 2 days, then twice daily x 2 days, then 3 times daily. Use the lowest dose needed that controls cough. Taper back off if you decide to quit.   Don't forget to get that flu shot.

## 2014-02-24 NOTE — Progress Notes (Signed)
Patient ID: [name] [name], male    DOB: 28-Jan-1935, 79 y.o.   MRN: [id number]  HPI 10/30/10- 44 yoM never smoker, followed for pulmonary fibrosis and RML nodule, complicated by bronchiectasis, GERD and HBP. Last here June 08, 2010- note reviewed.  He continues Pulmonary Rehab. He finished clinical trial at [facility] and has now started perfenadone/ Asend trial at [facility]. With these he is getting PFTs, 6MWT and xrays at [facility].  He notes that dyspnea is stable- doe on stairs, able to play golf twice weekly. Dry cough is gradually worse. Codeine cough syrup and benzonatate help.   05/01/11- 10/30/10- 36 yoM never smoker, followed for pulmonary fibrosis and RML nodule, complicated by bronchiectasis, GERD and HBP Has had flu vaccine. Still participating in pulmonary rehabilitation twice a week as he continues with a research drug protocol at [facility]. He had a negative test for reflux recently. He describes a protracted "cold" since November 1. He may have a little fever and discolored sputum at the beginning. We had sent a Z-Pak and he is now taking an Alka-Seltzer product. Over the last 6 months he has felt about the same. He notices chronic cough with deep breath but no progressive shortness of breath. He says he looked stable on a 6 minute walk test and PFT at [facility]. Spiriva did not help- it is no longer being used.  10/29/11-  75 yoM never smoker, followed for pulmonary fibrosis and RML nodule, complicated by bronchiectasis, GERD and HBP  Patient c/o "slight" sob, and cough. Denies wheezing. He finishes his second drug trial for interstitial lung disease at [facility] and is now getting "the real by mouth". The protocol includes chest x-ray, PFT and 6 minute walk test being done there so we will not repeat them. Subjectively, and from what he is told at [facility], he is basically stable with persistent cough. Cough wakes him early in the mornings . He continues to play golf regularly.  In March he had double hernia  repair. As he recovers, he will resume pulmonary rehabilitation here in July.  04/29/12- 57 yoM never smoker, followed for pulmonary fibrosis and RML nodule, complicated by bronchiectasis, GERD and HBP FOLLOWS FOR: dry cough and still having slight SOB-no worse than last OV. He is still being followed now on open label perfenadone/ Pulmonary Fibrosis trial at [facility] and says he is now getting the active drug. He is holding his own. At last office visit at [facility] in November, he desaturated to 88% on his 6 minute walk test. He continues pulmonary rehabilitation here and likes it. Had flu vaccine. Denies cardiac problems. Coughs for about one hour each morning and this is his biggest problem. Uses Tessalon Perles with Phenergan codeine taken once at night. Prilosec controls heartburn. Has had pneumonia vaccine and flu vaccine. CXR 07/13/11 IMPRESSION:  No active disease. Stable mild hyperinflation and chronic fibrotic  changes.  Original Report Authenticated By: Lahoma Crocker, M.D.   02/24/13- 78 yoM never smoker, followed for pulmonary fibrosis/ UIP and RML nodule, complicated by bronchiectasis, GERD and HBP FOLLOWS FOR:c/o chest congestion x 3 wks.,cough-clear,nasal drainage-clear,sob increased,occass wheezing when sleeping,denies cp or tightness,denies fcs,was at [place] last wk gave zpak finished  Caught a cold 3 weeks ago, slowly better. Still hoarse. Less congested. Finished Z-Pak. Had chest x-ray at [facility] where he continues perfenadone f/u for UIP now x 1.5 years. these. O2 2L/ [employer] for sleep.   02/24/14- 79 yoM never smoker, followed for pulmonary fibrosis/ UIP and RML nodule,  complicated by bronchiectasis, GERD and HBP FOLLOW FOR: Pulmonary Fibrosis; dry cough; no further complaints All Labs, Xrays, PFTs being done at [facility]    Review of Systems- see HPI Constitutional:   No-   weight loss, night sweats, fevers, chills, fatigue, lassitude. HEENT:   No-  headaches, difficulty swallowing, tooth/dental  problems, sore throat,       No-  sneezing, itching, ear ache, +nasal congestion, post nasal drip,  CV:  No-   chest pain, orthopnea, PND, swelling in lower extremities, anasarca, dizziness, palpitations Resp: +  shortness of breath with exertion or at rest.              No-   productive cough,  +non-productive cough,  No- coughing up of blood.              No-   change in color of mucus.  No- wheezing.   Skin: No-   rash or lesions. GI:  No-   heartburn, indigestion, abdominal pain, nausea, vomiting, GU: ain. MS:  No-   joint pain or swelling.   Neuro-     nothing unusual Psych:  No- change in mood or affect. No depression or anxiety.  No memory loss.   Objective:   Physical Exam General- Alert, Oriented, Affect-appropriate, Distress- none acute Skin- rash-none, lesions- none, excoriation- none Lymphadenopathy- none Head- atraumatic            Eyes- Gross vision intact, PERRLA, conjunctivae clear secretions            Ears- Hearing, canals-normal            Nose- Clear, no-Septal dev, mucus, polyps, erosion, perforation             Throat- Mallampati II , mucosa clear , drainage- none, tonsils- atrophic Neck- flexible , trachea midline, no stridor , thyroid nl, carotid no bruit Chest - symmetrical excursion , unlabored           Heart/CV- RRR , no murmur , no gallop  , no rub, nl s1 s2                           - JVD- none , edema- none, stasis changes- none, varices- none           Lung- +diffuse crackles, wheeze- none, +mild dry  cough , dullness-none, rub- none,+tripod                            posture           Chest wall-  Abd-  Br/ Gen/ Rectal- Not done, not indicated Extrem- cyanosis- none, clubbing- none, atrophy- none, strength- nl Neuro- grossly intact to observation

## 2014-02-25 ENCOUNTER — Encounter (HOSPITAL_COMMUNITY)
Admission: RE | Admit: 2014-02-25 | Discharge: 2014-02-25 | Disposition: A | Discharge status: Home / Self Care | Payer: Self-pay | Source: Ambulatory Visit | Attending: Internal Medicine | Admitting: Internal Medicine

## 2014-03-02 ENCOUNTER — Encounter (HOSPITAL_COMMUNITY): Payer: Self-pay

## 2014-03-02 DIAGNOSIS — Z23 Encounter for immunization: Secondary | ICD-10-CM | POA: Diagnosis not present

## 2014-03-02 DIAGNOSIS — K219 Gastro-esophageal reflux disease without esophagitis: Secondary | ICD-10-CM | POA: Diagnosis not present

## 2014-03-02 DIAGNOSIS — J849 Interstitial pulmonary disease, unspecified: Secondary | ICD-10-CM | POA: Diagnosis not present

## 2014-03-02 DIAGNOSIS — Z79899 Other long term (current) drug therapy: Secondary | ICD-10-CM | POA: Diagnosis not present

## 2014-03-02 DIAGNOSIS — R110 Nausea: Secondary | ICD-10-CM | POA: Diagnosis not present

## 2014-03-02 DIAGNOSIS — R05 Cough: Secondary | ICD-10-CM | POA: Diagnosis not present

## 2014-03-04 ENCOUNTER — Encounter (HOSPITAL_COMMUNITY)
Admission: RE | Admit: 2014-03-04 | Discharge: 2014-03-04 | Disposition: A | Discharge status: Home / Self Care | Payer: Self-pay | Source: Ambulatory Visit | Attending: Internal Medicine | Admitting: Internal Medicine

## 2014-03-09 ENCOUNTER — Encounter (HOSPITAL_COMMUNITY)
Admission: RE | Admit: 2014-03-09 | Discharge: 2014-03-09 | Disposition: A | Discharge status: Home / Self Care | Payer: Self-pay | Source: Ambulatory Visit | Attending: Internal Medicine | Admitting: Internal Medicine

## 2014-03-11 ENCOUNTER — Encounter (HOSPITAL_COMMUNITY)
Admission: RE | Admit: 2014-03-11 | Discharge: 2014-03-11 | Disposition: A | Discharge status: Home / Self Care | Payer: Self-pay | Source: Ambulatory Visit | Attending: Internal Medicine | Admitting: Internal Medicine

## 2014-03-16 ENCOUNTER — Encounter (HOSPITAL_COMMUNITY): Payer: Self-pay

## 2014-03-18 ENCOUNTER — Encounter (HOSPITAL_COMMUNITY): Payer: Self-pay

## 2014-03-23 ENCOUNTER — Encounter (HOSPITAL_COMMUNITY)
Admission: RE | Admit: 2014-03-23 | Discharge: 2014-03-23 | Disposition: A | Discharge status: Home / Self Care | Payer: Self-pay | Source: Ambulatory Visit | Attending: Internal Medicine | Admitting: Internal Medicine

## 2014-03-23 DIAGNOSIS — R05 Cough: Secondary | ICD-10-CM | POA: Insufficient documentation

## 2014-03-23 DIAGNOSIS — R0600 Dyspnea, unspecified: Secondary | ICD-10-CM | POA: Insufficient documentation

## 2014-03-23 DIAGNOSIS — J90 Pleural effusion, not elsewhere classified: Secondary | ICD-10-CM | POA: Insufficient documentation

## 2014-03-23 DIAGNOSIS — I10 Essential (primary) hypertension: Secondary | ICD-10-CM | POA: Insufficient documentation

## 2014-03-23 DIAGNOSIS — J8410 Pulmonary fibrosis, unspecified: Secondary | ICD-10-CM | POA: Insufficient documentation

## 2014-03-23 DIAGNOSIS — Z5189 Encounter for other specified aftercare: Secondary | ICD-10-CM | POA: Insufficient documentation

## 2014-03-23 DIAGNOSIS — K219 Gastro-esophageal reflux disease without esophagitis: Secondary | ICD-10-CM | POA: Insufficient documentation

## 2014-03-23 DIAGNOSIS — E785 Hyperlipidemia, unspecified: Secondary | ICD-10-CM | POA: Insufficient documentation

## 2014-03-25 ENCOUNTER — Encounter (HOSPITAL_COMMUNITY): Payer: Self-pay

## 2014-03-30 ENCOUNTER — Encounter (HOSPITAL_COMMUNITY)
Admission: RE | Admit: 2014-03-30 | Discharge: 2014-03-30 | Disposition: A | Discharge status: Home / Self Care | Payer: Self-pay | Source: Ambulatory Visit | Attending: Internal Medicine | Admitting: Internal Medicine

## 2014-04-01 ENCOUNTER — Encounter (HOSPITAL_COMMUNITY)
Admission: RE | Admit: 2014-04-01 | Discharge: 2014-04-01 | Disposition: A | Discharge status: Home / Self Care | Payer: Self-pay | Source: Ambulatory Visit | Attending: Internal Medicine | Admitting: Internal Medicine

## 2014-04-06 ENCOUNTER — Encounter (HOSPITAL_COMMUNITY)
Admission: RE | Admit: 2014-04-06 | Discharge: 2014-04-06 | Disposition: A | Discharge status: Home / Self Care | Payer: Self-pay | Source: Ambulatory Visit | Attending: Internal Medicine | Admitting: Internal Medicine

## 2014-04-08 ENCOUNTER — Encounter (HOSPITAL_COMMUNITY)
Admission: RE | Admit: 2014-04-08 | Discharge: 2014-04-08 | Disposition: A | Discharge status: Home / Self Care | Payer: Self-pay | Source: Ambulatory Visit | Attending: Internal Medicine | Admitting: Internal Medicine

## 2014-04-13 ENCOUNTER — Encounter (HOSPITAL_COMMUNITY)
Admission: RE | Admit: 2014-04-13 | Discharge: 2014-04-13 | Disposition: A | Discharge status: Home / Self Care | Payer: Self-pay | Source: Ambulatory Visit | Attending: Internal Medicine | Admitting: Internal Medicine

## 2014-04-15 ENCOUNTER — Encounter (HOSPITAL_COMMUNITY): Payer: Self-pay

## 2014-04-20 ENCOUNTER — Encounter (HOSPITAL_COMMUNITY)
Admission: RE | Admit: 2014-04-20 | Discharge: 2014-04-20 | Disposition: A | Discharge status: Home / Self Care | Payer: Self-pay | Source: Ambulatory Visit | Attending: Internal Medicine | Admitting: Internal Medicine

## 2014-04-20 DIAGNOSIS — J8410 Pulmonary fibrosis, unspecified: Secondary | ICD-10-CM | POA: Insufficient documentation

## 2014-04-20 DIAGNOSIS — K219 Gastro-esophageal reflux disease without esophagitis: Secondary | ICD-10-CM | POA: Insufficient documentation

## 2014-04-20 DIAGNOSIS — I10 Essential (primary) hypertension: Secondary | ICD-10-CM | POA: Insufficient documentation

## 2014-04-20 DIAGNOSIS — Z5189 Encounter for other specified aftercare: Secondary | ICD-10-CM | POA: Insufficient documentation

## 2014-04-20 DIAGNOSIS — J90 Pleural effusion, not elsewhere classified: Secondary | ICD-10-CM | POA: Insufficient documentation

## 2014-04-20 DIAGNOSIS — E785 Hyperlipidemia, unspecified: Secondary | ICD-10-CM | POA: Insufficient documentation

## 2014-04-20 DIAGNOSIS — R05 Cough: Secondary | ICD-10-CM | POA: Insufficient documentation

## 2014-04-20 DIAGNOSIS — R0600 Dyspnea, unspecified: Secondary | ICD-10-CM | POA: Insufficient documentation

## 2014-04-22 ENCOUNTER — Encounter (HOSPITAL_COMMUNITY)
Admission: RE | Admit: 2014-04-22 | Discharge: 2014-04-22 | Disposition: A | Discharge status: Home / Self Care | Payer: Self-pay | Source: Ambulatory Visit | Attending: Internal Medicine | Admitting: Internal Medicine

## 2014-04-27 ENCOUNTER — Encounter (HOSPITAL_COMMUNITY)
Admission: RE | Admit: 2014-04-27 | Discharge: 2014-04-27 | Disposition: A | Discharge status: Home / Self Care | Payer: Self-pay | Source: Ambulatory Visit | Attending: Internal Medicine | Admitting: Internal Medicine

## 2014-04-29 ENCOUNTER — Encounter (HOSPITAL_COMMUNITY)
Admission: RE | Admit: 2014-04-29 | Discharge: 2014-04-29 | Disposition: A | Discharge status: Home / Self Care | Payer: Self-pay | Source: Ambulatory Visit | Attending: Internal Medicine | Admitting: Internal Medicine

## 2014-05-04 ENCOUNTER — Encounter (HOSPITAL_COMMUNITY)
Admission: RE | Admit: 2014-05-04 | Discharge: 2014-05-04 | Disposition: A | Discharge status: Home / Self Care | Payer: Self-pay | Source: Ambulatory Visit | Attending: Internal Medicine | Admitting: Internal Medicine

## 2014-05-06 ENCOUNTER — Encounter (HOSPITAL_COMMUNITY)
Admission: RE | Admit: 2014-05-06 | Discharge: 2014-05-06 | Disposition: A | Discharge status: Home / Self Care | Payer: Self-pay | Source: Ambulatory Visit | Attending: Internal Medicine | Admitting: Internal Medicine

## 2014-05-11 ENCOUNTER — Encounter (HOSPITAL_COMMUNITY): Payer: Self-pay

## 2014-05-12 DIAGNOSIS — N183 Chronic kidney disease, stage 3 (moderate): Secondary | ICD-10-CM | POA: Diagnosis not present

## 2014-05-12 DIAGNOSIS — J8410 Pulmonary fibrosis, unspecified: Secondary | ICD-10-CM | POA: Diagnosis not present

## 2014-05-12 DIAGNOSIS — N261 Atrophy of kidney (terminal): Secondary | ICD-10-CM | POA: Diagnosis not present

## 2014-05-12 DIAGNOSIS — E785 Hyperlipidemia, unspecified: Secondary | ICD-10-CM | POA: Diagnosis not present

## 2014-05-12 DIAGNOSIS — M62830 Muscle spasm of back: Secondary | ICD-10-CM | POA: Diagnosis not present

## 2014-05-12 DIAGNOSIS — Z0001 Encounter for general adult medical examination with abnormal findings: Secondary | ICD-10-CM | POA: Diagnosis not present

## 2014-05-12 DIAGNOSIS — I10 Essential (primary) hypertension: Secondary | ICD-10-CM | POA: Diagnosis not present

## 2014-05-12 DIAGNOSIS — Z79899 Other long term (current) drug therapy: Secondary | ICD-10-CM | POA: Diagnosis not present

## 2014-05-13 ENCOUNTER — Encounter (HOSPITAL_COMMUNITY): Payer: Self-pay

## 2014-05-18 ENCOUNTER — Encounter (HOSPITAL_COMMUNITY): Payer: Self-pay

## 2014-05-18 DIAGNOSIS — M545 Low back pain: Secondary | ICD-10-CM | POA: Diagnosis not present

## 2014-05-24 DIAGNOSIS — M545 Low back pain: Secondary | ICD-10-CM | POA: Diagnosis not present

## 2014-05-25 ENCOUNTER — Encounter (HOSPITAL_COMMUNITY)
Admission: RE | Admit: 2014-05-25 | Discharge: 2014-05-25 | Disposition: A | Discharge status: Home / Self Care | Payer: Self-pay | Source: Ambulatory Visit | Attending: Internal Medicine | Admitting: Internal Medicine

## 2014-05-25 DIAGNOSIS — R05 Cough: Secondary | ICD-10-CM | POA: Insufficient documentation

## 2014-05-25 DIAGNOSIS — J90 Pleural effusion, not elsewhere classified: Secondary | ICD-10-CM | POA: Insufficient documentation

## 2014-05-25 DIAGNOSIS — I10 Essential (primary) hypertension: Secondary | ICD-10-CM | POA: Insufficient documentation

## 2014-05-25 DIAGNOSIS — J8410 Pulmonary fibrosis, unspecified: Secondary | ICD-10-CM | POA: Insufficient documentation

## 2014-05-25 DIAGNOSIS — R0600 Dyspnea, unspecified: Secondary | ICD-10-CM | POA: Insufficient documentation

## 2014-05-25 DIAGNOSIS — Z5189 Encounter for other specified aftercare: Secondary | ICD-10-CM | POA: Insufficient documentation

## 2014-05-25 DIAGNOSIS — E785 Hyperlipidemia, unspecified: Secondary | ICD-10-CM | POA: Insufficient documentation

## 2014-05-25 DIAGNOSIS — K219 Gastro-esophageal reflux disease without esophagitis: Secondary | ICD-10-CM | POA: Insufficient documentation

## 2014-05-26 DIAGNOSIS — M545 Low back pain: Secondary | ICD-10-CM | POA: Diagnosis not present

## 2014-05-27 ENCOUNTER — Encounter (HOSPITAL_COMMUNITY)
Admission: RE | Admit: 2014-05-27 | Discharge: 2014-05-27 | Disposition: A | Discharge status: Home / Self Care | Payer: Self-pay | Source: Ambulatory Visit | Attending: Internal Medicine | Admitting: Internal Medicine

## 2014-05-31 DIAGNOSIS — M545 Low back pain: Secondary | ICD-10-CM | POA: Diagnosis not present

## 2014-06-01 ENCOUNTER — Encounter (HOSPITAL_COMMUNITY)
Admission: RE | Admit: 2014-06-01 | Discharge: 2014-06-01 | Disposition: A | Discharge status: Home / Self Care | Payer: Self-pay | Source: Ambulatory Visit | Attending: Internal Medicine | Admitting: Internal Medicine

## 2014-06-03 ENCOUNTER — Encounter (HOSPITAL_COMMUNITY)
Admission: RE | Admit: 2014-06-03 | Discharge: 2014-06-03 | Disposition: A | Discharge status: Home / Self Care | Payer: Self-pay | Source: Ambulatory Visit | Attending: Internal Medicine | Admitting: Internal Medicine

## 2014-06-08 ENCOUNTER — Encounter (HOSPITAL_COMMUNITY): Payer: Self-pay

## 2014-06-09 DIAGNOSIS — M545 Low back pain: Secondary | ICD-10-CM | POA: Diagnosis not present

## 2014-06-10 ENCOUNTER — Encounter (HOSPITAL_COMMUNITY): Payer: Self-pay

## 2014-06-10 DIAGNOSIS — M545 Low back pain: Secondary | ICD-10-CM | POA: Diagnosis not present

## 2014-06-12 ENCOUNTER — Telehealth: Payer: Self-pay | Admitting: Internal Medicine

## 2014-06-12 NOTE — Telephone Encounter (Signed)
Yes. I think he is in an open-label perfenadone study at [facility], but I forget.

## 2014-06-12 NOTE — Telephone Encounter (Signed)
Hi Clint  He has IPF and follows at [facility] and noticed several drug trial participation. You think he will be willing to participate  In several of our studies? Ok for me/us from research to call him?  Thanks  Dr. Brand Males, M.D., [facility].C.P Pulmonary and Critical Care Medicine Staff Physician [place] [facility] Pager: [phone], If no answer or between  15:00h - 7:00h: call [phone]  06/12/2014 6:50 AM

## 2014-06-13 NOTE — Telephone Encounter (Signed)
[name] [name]  Can you go nto care everywhere and get his [facility] pft's and latest office note for me pleae. I am unable to do that. Let me know  Thanks  M

## 2014-06-15 ENCOUNTER — Encounter (HOSPITAL_COMMUNITY)
Admission: RE | Admit: 2014-06-15 | Discharge: 2014-06-15 | Disposition: A | Discharge status: Home / Self Care | Payer: Self-pay | Source: Ambulatory Visit | Attending: Internal Medicine | Admitting: Internal Medicine

## 2014-06-15 NOTE — Telephone Encounter (Signed)
Called and spoke to pt's wife. Appt made with MR on 06/16/2014 at 11:30am. Pt's wife verbalized understanding and denied any further questions or concerns at this time.

## 2014-06-15 NOTE — Telephone Encounter (Signed)
Daneil Dan  It appears that without an active OV impossible to get into [facility] records. So, could you please get this patient in to see me - 54  Min visit to discuss IPF Rx options beyond active current Rx. . See if you can get him in this week ASAP  Thanks  Dr. Brand Males, M.D., [facility].C.P Pulmonary and Critical Care Medicine Staff Physician [place] [facility] Pager: [phone], If no answer or between  15:00h - 7:00h: call [phone]  06/15/2014 9:02 AM

## 2014-06-16 ENCOUNTER — Ambulatory Visit (INDEPENDENT_AMBULATORY_CARE_PROVIDER_SITE_OTHER): Payer: Medicare Other | Admitting: Internal Medicine

## 2014-06-16 ENCOUNTER — Encounter: Payer: Self-pay | Admitting: Internal Medicine

## 2014-06-16 VITALS — BP 124/72 | HR 59 | Ht 67.0 in | Wt 163.0 lb

## 2014-06-16 DIAGNOSIS — J9611 Chronic respiratory failure with hypoxia: Secondary | ICD-10-CM | POA: Diagnosis not present

## 2014-06-16 DIAGNOSIS — M545 Low back pain: Secondary | ICD-10-CM | POA: Diagnosis not present

## 2014-06-16 DIAGNOSIS — J84112 Idiopathic pulmonary fibrosis: Secondary | ICD-10-CM | POA: Insufficient documentation

## 2014-06-16 NOTE — Patient Instructions (Addendum)
ICD-9-CM ICD-10-CM   1. IPF (idiopathic pulmonary fibrosis) 516.31 J84.112 Spirometry with Graph    Disease appears stable We discussed several study options Do Spirometry without BD and DLCO ASAP Refer to IPF research study coordinator REturn to see me after that ASAP

## 2014-06-16 NOTE — Progress Notes (Signed)
Subjective:    Patient ID: [name] [name], male    DOB: 06-Aug-1934, 80 y.o.   MRN: [id number]  HPI  PRimary pulm - Dr Baird Lyons at [facility], ILD - Dr Chana Bode at [facility]  #IPF  Patient with idiopathic pulmonary fibrosis since 2010. Per review of the [facility] charts:  He was referred there in Oct 2010.  Autoimmune panel in 2011 and 2012 at [facility] was normal. Esophageal manometry November 2012 was normal.  Not sure if he had bx to prove IPF; suspect clinical  Prior therapies for pulmonary fibrosis IPF include at [facility] protocol [phone] trial, testing the use of an IL-13 inhibitor for the treatment of IPF. - developed rash and LFT abnormalities end of study  Current therapy for IPF through [facility] include  - ASCEND tria (pirfenidone v placebo) -> then in [employer] PIPF 012, open label pirfenidone -> then transitioned to commercial drug after it was approved in October 2014   Progression   - he appears to have progressive disease based on data below  PFT - [facility] data FVC fev1 ratio BD fev1 TLC DLCO ct RX  2010 October - referred to [place]   [facility]24/2009 [facility] 2.58L/65%     10.2/43%  Ascend protocol in 2011  5.12/2010       Progression +. CT now c/w UIP Open lable pirfenidone in 2012  03/28/12 at [facility] 2.44L/63%     7.3/32%    03/02/14 at [facility]  2.06L/53*% 1.974L/59% 79 x x 6,8/29%    06/16/13 at [place] 2.15/55%           Performance and symptoms   - uses 2L. Functional. Plays golf. Has severe cough and class 2-3 doe   OV 06/17/2014  Chief Complaint  Patient presents with  . Follow-up    Pt here of Dr. [name]. Pt c/o DOE, cough with mucus production- clear in color. Pt denies CP/tightness.      PAtient of Dr [name] for IPF. Above data extracted from [facility] chart review. He heard me talk several weeeks ago at PFF patient support group local chapter and had expressed interest in our research protocols for  IPF. HE is here to discuss more therapy options through research protocol.HE feels he is stable overall but admits to progressive IPF symptoms over past few yeas (see above). Cough is very severe and is on multiple symtom Rx meds. [name]  He is on stable dose of esbriet. Unclear if is full max 3 pills tid or pirfenidone as per my nurse documentatin or per DR Racklet "8o 801 mg (3 tabs) at breakfast and lunch and 534 mg (2 tabs) with dinner given nausea ". He and wife admit to weight loss with esbriet "30#" but no other side effect. Recent L[facility] at [facility] normal. He is due to see Dr [name] next month  Spiromery today- FVC > 50%. No dlco   Labs    - creat 1. With gfr 56 in 2013 in our system (nlast creat at [facility]: 2012)   Review of Systems  Constitutional: Negative for fever and unexpected weight change.  HENT: Negative for congestion, dental problem, ear pain, nosebleeds, postnasal drip, rhinorrhea, sinus pressure, sneezing, sore throat and trouble swallowing.   Eyes: Negative for redness and itching.  Respiratory: Positive for cough and shortness of breath. Negative for chest tightness and wheezing.   Cardiovascular: Negative for palpitations and leg swelling.  Gastrointestinal: Negative for nausea and vomiting.  Genitourinary: Negative for dysuria.  Musculoskeletal: Negative for joint swelling.  Skin: Negative for rash.  Neurological: Negative for headaches.  Hematological: Does not bruise/bleed easily.  Psychiatric/Behavioral: Negative for dysphoric mood. The patient is not nervous/anxious.     Current outpatient prescriptions:  .  aspirin 325 MG tablet, Take 162.5 mg by mouth daily., Disp: , Rfl:  .  atenolol (TENORMIN) 25 MG tablet, Take 25 mg by mouth daily., Disp: , Rfl:  .  atorvastatin (LIPITOR) 80 MG tablet, Take 80 mg by mouth daily. , Disp: , Rfl:  .  benzonatate (TESSALON) 100 MG capsule, Ad lib., Disp: , Rfl:  .  clotrimazole-betamethasone (LOTRISONE) cream, Apply as directed, Disp:  , Rfl:  .  HYDROCODONE-CHLORPHENIRAMINE PO, Take by mouth. As needed., Disp: , Rfl:  .  ibuprofen (ADVIL,MOTRIN) 200 MG tablet, Take 200 mg by mouth every 6 (six) hours as needed., Disp: , Rfl:  .  KLOR-CON M10 10 MEQ tablet, Take 1 tablet by mouth daily., Disp: , Rfl:  .  metoCLOPramide (REGLAN) 10 MG tablet, Take 15 mg by mouth daily as needed. , Disp: , Rfl:  .  Multiple Vitamin (MULITIVITAMIN WITH MINERALS) TABS, Take 1 tablet by mouth daily., Disp: , Rfl:  .  omeprazole (PRILOSEC) 20 MG capsule, Take 20 mg by mouth daily. 30 minutes before breakfast, Disp: , Rfl:  .  Pirfenidone 267 MG CAPS, Take 3 tablets by mouth 3 (three) times daily., Disp: , Rfl:  .  promethazine-codeine (PHENERGAN WITH CODEINE) 6.25-10 MG/5ML syrup, Take 5 mLs by mouth every 6 (six) hours as needed for cough., Disp: 200 mL, Rfl: 0 .  tamsulosin (FLOMAX) 0.4 MG CAPS capsule, Take 0.4 mg by mouth daily., Disp: , Rfl:  .  telmisartan-hydrochlorothiazide (MICARDIS HCT) 80-25 MG per tablet, Take 1 tablet by mouth daily., Disp: , Rfl:      Objective:   Physical Exam  Filed Vitals:   06/16/14 1145  BP: 124/72  Pulse: 59  Height: 5\' 7"  (1.702 m)  Weight: 163 lb (73.936 kg)  SpO2: 98%    Discussion only visit  Gen: chronic unwell male HEENT: O2 on Lungs: crackles     Assessment & Plan:     ICD-9-CM ICD-10-CM   1. IPF (idiopathic pulmonary fibrosis) 516.31 J84.112 Spirometry with Graph     Pulmonary function test  2. Chronic respiratory failure with hypoxia 518.83 J96.11    799.02     Appears to have stable disease compared to spirometry at [facility] in oct 2015 but progressive disease with chronic respiratory failure despite Esbriet Rx. He has had weight loss with esbriet ut otherwise tolerating it fine. Hard to determine if esbriet has slowed down progression in a situation "what it might have been without esbriet". Due to progressive diseas and severe symptim he is seeking trial options  Explained the  following trial currently being offered  1) DISEASE MODIFYING - For those with FVC >/= 50%, DLCO >/= 30%   A1) For those on background Esbriet x 6 months. A Phase 4 trial adding Ofev. Six months only. Mainly safety data collection wtih efficacy. This study will form the base for combining these 2 drugs in the future. Side effet concners are add-on liver and gi side effects + indepdent side effects of each drug. Benefit is potential additive effect in slowing down IPF but safety needs to be established first  A2) For those on background Esbriet x 6 months - A Phase  2 trial adding Vismodegib which is an approved drug in the market for last several years and is used in advanced basal cell CA. Safe drug with mild-moderate side effect reversible profile of hair loss, myalgia, aguesia and weight loss. Vismodegib inhibits the Hedeghog Pathway which believed to be active in IPF. Benefit is potential additive effect in slowing down IPF but safety needs to be established first    B) SYMPTOM PALLIATION   B1) for all IPF patiens with cough . Novel agent called AF219 which acts on afferent neural pathway.  Concerns are taste loss, renal insufficiency. 6 weeks study randomized, double blind, cross over of 2 week drug/placebo -> 2-3 week washout -> 2 week drug/placebo    Based on all of above  A) he is interested in [place] adding Ofev to [place].  B) I have referred him to research coordinator C) He will look at ICF and once consented will screen for this study   Will update Dr Glenna Fellows who he will follow with for IPF care. I will ensure Dr [name] is ok with thisplan   > 50% of this > 25 min visit spent in face to face counseling or coordination of care (15 min visit converted to 25 min)   Dr. Brand Males, M.D., [facility].C.P Pulmonary and Critical Care Medicine Staff Physician [place] [facility] Pager: [phone], If no answer or between  15:00h - 7:00h: call  [phone]  06/17/2014 6:21 AM

## 2014-06-17 ENCOUNTER — Encounter (HOSPITAL_COMMUNITY)
Admission: RE | Admit: 2014-06-17 | Discharge: 2014-06-17 | Disposition: A | Discharge status: Home / Self Care | Payer: Self-pay | Source: Ambulatory Visit | Attending: Internal Medicine | Admitting: Internal Medicine

## 2014-06-17 ENCOUNTER — Telehealth: Payer: Self-pay | Admitting: Internal Medicine

## 2014-06-17 DIAGNOSIS — J9611 Chronic respiratory failure with hypoxia: Secondary | ICD-10-CM | POA: Insufficient documentation

## 2014-06-17 NOTE — Telephone Encounter (Signed)
[name] [name]  Please call [name], MD patient pcp and get patient latest bmet aSAP. Last one we have is 2013  Thanks  Dr. Brand Males, M.D., [facility].C.P Pulmonary and Critical Care Medicine Staff Physician [place] [facility] Pager: [phone], If no answer or between  15:00h - 7:00h: call [phone]  06/17/2014 6:27 AM

## 2014-06-17 NOTE — Telephone Encounter (Signed)
ATC x 2. No option for vm. WCB.

## 2014-06-21 NOTE — Telephone Encounter (Signed)
lmtcb for medical records.  

## 2014-06-22 ENCOUNTER — Encounter (HOSPITAL_COMMUNITY): Payer: Self-pay

## 2014-06-22 ENCOUNTER — Ambulatory Visit (INDEPENDENT_AMBULATORY_CARE_PROVIDER_SITE_OTHER): Payer: Medicare Other | Admitting: Internal Medicine

## 2014-06-22 ENCOUNTER — Encounter: Payer: Self-pay | Admitting: Internal Medicine

## 2014-06-22 VITALS — BP 112/78 | HR 63 | Temp 98.3°F | Resp 17 | Ht 67.0 in | Wt 165.2 lb

## 2014-06-22 DIAGNOSIS — J84112 Idiopathic pulmonary fibrosis: Secondary | ICD-10-CM

## 2014-06-22 DIAGNOSIS — Z5189 Encounter for other specified aftercare: Secondary | ICD-10-CM | POA: Insufficient documentation

## 2014-06-22 DIAGNOSIS — J90 Pleural effusion, not elsewhere classified: Secondary | ICD-10-CM | POA: Insufficient documentation

## 2014-06-22 DIAGNOSIS — E785 Hyperlipidemia, unspecified: Secondary | ICD-10-CM | POA: Insufficient documentation

## 2014-06-22 DIAGNOSIS — K219 Gastro-esophageal reflux disease without esophagitis: Secondary | ICD-10-CM | POA: Insufficient documentation

## 2014-06-22 DIAGNOSIS — Z006 Encounter for examination for normal comparison and control in clinical research program: Secondary | ICD-10-CM

## 2014-06-22 DIAGNOSIS — I10 Essential (primary) hypertension: Secondary | ICD-10-CM | POA: Insufficient documentation

## 2014-06-22 DIAGNOSIS — R0600 Dyspnea, unspecified: Secondary | ICD-10-CM | POA: Insufficient documentation

## 2014-06-22 DIAGNOSIS — J8410 Pulmonary fibrosis, unspecified: Secondary | ICD-10-CM | POA: Insufficient documentation

## 2014-06-22 DIAGNOSIS — R05 Cough: Secondary | ICD-10-CM | POA: Insufficient documentation

## 2014-06-22 NOTE — Progress Notes (Signed)
PFT done today. research

## 2014-06-22 NOTE — Telephone Encounter (Signed)
lmtcb for medical records.  

## 2014-06-23 DIAGNOSIS — M545 Low back pain: Secondary | ICD-10-CM | POA: Diagnosis not present

## 2014-06-23 NOTE — Telephone Encounter (Signed)
Called and spoke to [place]. Pt has not had a recent BMP but had a CMP in 04/2014. CMP lab placed in MR's look-at's. Nothing further needed.

## 2014-06-24 ENCOUNTER — Encounter (HOSPITAL_COMMUNITY)
Admission: RE | Admit: 2014-06-24 | Discharge: 2014-06-24 | Disposition: A | Discharge status: Home / Self Care | Payer: Self-pay | Source: Ambulatory Visit | Attending: Internal Medicine | Admitting: Internal Medicine

## 2014-06-29 ENCOUNTER — Encounter (HOSPITAL_COMMUNITY)
Admission: RE | Admit: 2014-06-29 | Discharge: 2014-06-29 | Disposition: A | Discharge status: Home / Self Care | Payer: Self-pay | Source: Ambulatory Visit | Attending: Internal Medicine | Admitting: Internal Medicine

## 2014-07-01 ENCOUNTER — Encounter (HOSPITAL_COMMUNITY)
Admission: RE | Admit: 2014-07-01 | Discharge: 2014-07-01 | Disposition: A | Discharge status: Home / Self Care | Payer: Self-pay | Source: Ambulatory Visit | Attending: Internal Medicine | Admitting: Internal Medicine

## 2014-07-06 ENCOUNTER — Encounter (HOSPITAL_COMMUNITY): Payer: Self-pay

## 2014-07-06 DIAGNOSIS — J84112 Idiopathic pulmonary fibrosis: Secondary | ICD-10-CM | POA: Diagnosis not present

## 2014-07-08 ENCOUNTER — Encounter (HOSPITAL_COMMUNITY)
Admission: RE | Admit: 2014-07-08 | Discharge: 2014-07-08 | Disposition: A | Discharge status: Home / Self Care | Payer: Self-pay | Source: Ambulatory Visit | Attending: Internal Medicine | Admitting: Internal Medicine

## 2014-07-13 ENCOUNTER — Encounter (HOSPITAL_COMMUNITY)
Admission: RE | Admit: 2014-07-13 | Discharge: 2014-07-13 | Disposition: A | Discharge status: Home / Self Care | Payer: Self-pay | Source: Ambulatory Visit | Attending: Internal Medicine | Admitting: Internal Medicine

## 2014-07-14 ENCOUNTER — Telehealth: Payer: Self-pay | Admitting: Internal Medicine

## 2014-07-14 NOTE — Telephone Encounter (Signed)
Reviewed Dr [name] notes at [facility] 07/06/14 - and spoke to patient - he is not keen anymore on [id number] stuyd (esbriet + ofev ) combination. He is interested in the cough study 6 weeks  Of note, his dlco here was 27% but at [facility] it was higher t. T his is because at [facility] they use gaensler predicted equation while we use Ayers. His absolute performance was same.

## 2014-07-14 NOTE — Telephone Encounter (Signed)
[name] [name] got outside labs - creat 1.18mg %, gfr 60 12/23/2-15 with PCP [name], MD  Dr. Brand Males, M.D., [facility].C.P Pulmonary and Critical Care Medicine Staff Physician [place] [facility] Pager: [phone], If no answer or between  15:00h - 7:00h: call [phone]  07/14/2014 11:30 AM

## 2014-07-15 ENCOUNTER — Encounter (HOSPITAL_COMMUNITY)
Admission: RE | Admit: 2014-07-15 | Discharge: 2014-07-15 | Disposition: A | Discharge status: Home / Self Care | Payer: Federal, State, Local not specified - PPO | Source: Ambulatory Visit | Attending: Internal Medicine | Admitting: Internal Medicine

## 2014-07-18 DIAGNOSIS — Z006 Encounter for examination for normal comparison and control in clinical research program: Secondary | ICD-10-CM | POA: Insufficient documentation

## 2014-07-18 NOTE — Patient Instructions (Addendum)
ICD-9-CM ICD-10-CM   1. IPF (idiopathic pulmonary fibrosis) 516.31 J84.112 EKG 12-Lead     EKG 12-Lead  2. Patient in clinical research study V70.7 Z00.6   '  Proceed with [place] 2 ICF and screening process FU per protocl

## 2014-07-18 NOTE — Progress Notes (Signed)
Subjective:    Patient ID: [name] [name], male    DOB: 1934-09-01, 80 y.o.   MRN: [id number]  HPI   PRimary pulm - Dr [name] [name] at [place], ILD - Dr [name] [name] at [facility]  #IPF  Patient with idiopathic pulmonary fibrosis since 2010. Per review of the [facility] charts:  He was referred there in Oct 2010.  Autoimmune panel in 2011 and 2012 at [facility] was normal. Esophageal manometry November 2012 was normal.  Not sure if he had bx to prove IPF; suspect clinical  Prior therapies for pulmonary fibrosis IPF include at [facility] protocol [phone] trial, testing the use of an IL-13 inhibitor for the treatment of IPF. - developed rash and LFT abnormalities end of study  Current therapy for IPF through [facility] include  - ASCEND tria (pirfenidone v placebo) -> then in [employer] PIPF 012, open label pirfenidone -> then transitioned to commercial drug after it was approved in October 2014   Progression   - he appears to have progressive disease based on data below  PFT - [facility] data FVC fev1 ratio BD fev1 TLC DLCO ct RX comments  [facility]       Possible UIP - [facility]    06/24/2009 at [place] 2.58L/65%     10.2/43%  Ascend protocol in 2011   5.12/2010       Progression +. CT now c/w UIP Open lable pirfenidone in 2012   [facility]/12 [facility] 2.44L/63%     7.3/32%     [facility]/02/14 [facility]  2.06L/53*% 1.974L/59% 77 x x 6,8/29%     06/16/13 at [place] 2.15/55%        30# weight loss reportted since esbriet stat    Performance and symptoms   - uses 2L. Functional. Plays golf. Has severe cough and class 2-3 doe    OV 2/2/216  Research Subject in [place].gov Identifier: [id number] .Sponsored by  [employer] protocol number: [id number]. [name] [name] is a single arm, multicenter, open-label, Phase 1b study to evaluate the safety and tolerability of vismodegib in combination with pirfenidone in participants with idiopathic pulmonary fibrosis  (IPF) with FVC >/ = 50%, DLCO >/= 30% and already currently being treated with pirfenidone for >/= 24 weeks.  Vismodegib is an approved drug in the market used currently in advanced basal cell CA. Main (but not complete) side effects are mild-moderate and consists of   hair loss, myalgia, aguesia and weight loss. Vismodegib inhibits the Hedeghog Pathway which believed to be active in IPF. Benefit is potential additive effect in slowing down IPF but safety needs to be established first  This visit 07/18/2014 is a RESEARCHVisit and is for purpose of screening     Last seen end jan 2016. HE wants to enter in the [place] trial as above on hhis own will. He understands research trials. HE is doing it on his own free will      has a past medical history of Lung nodule; IPF (idiopathic pulmonary fibrosis); Unspecified essential hypertension; Other and unspecified hyperlipidemia; Esophageal reflux; Bradycardia; Fluttering heart; Palpitations; Cancer; and Pulmonary fibrosis.   reports that he has never smoked. He does not have any smokeless tobacco history on file.  Past Surgical History  Procedure Laterality Date  . Cystoscopy      removal of bladder tumor  . US echocardiography  07/25/2009    ef 55-60%  . Cardiovascular stress test  07/29/2009    ef 64%  .  Inguinal hernia repair  07/20/2011    Procedure: LAPAROSCOPIC BILATERAL INGUINAL HERNIA REPAIR;  Surgeon: [name] Hector, MD;  Location: [place];  Service: General;  Laterality: Bilateral;  . Femoral hernia repair  07/20/2011    Procedure: HERNIA REPAIR FEMORAL;  Surgeon: [name] Hector, MD;  Location: [place];  Service: General;  Laterality: Left;    No Known Allergies  Immunization History  Administered Date(s) Administered  . H1N1 05/19/2008  . Influenza Split 02/19/2012, 02/18/2014  . Influenza Whole 02/20/2008, 02/17/2010, 03/22/2011  . Meningococcal Polysaccharide 09/18/2012  . Pneumococcal Conjugate-13 09/18/2013    Family History    Problem Relation Age of Onset  . Coronary artery disease    . COPD    . Pulmonary fibrosis Brother   . COPD Mother   . Heart failure Father      Current outpatient prescriptions:  .  aspirin 325 MG tablet, Take 162.5 mg by mouth daily., Disp: , Rfl:  .  atenolol (TENORMIN) 25 MG tablet, Take 25 mg by mouth daily., Disp: , Rfl:  .  atorvastatin (LIPITOR) 80 MG tablet, Take 80 mg by mouth daily. , Disp: , Rfl:  .  benzonatate (TESSALON) 100 MG capsule, Ad lib., Disp: , Rfl:  .  clotrimazole-betamethasone (LOTRISONE) cream, Apply as directed, Disp: , Rfl:  .  HYDROCODONE-CHLORPHENIRAMINE PO, Take by mouth. As needed., Disp: , Rfl:  .  ibuprofen (ADVIL,MOTRIN) 200 MG tablet, Take 200 mg by mouth every 6 (six) hours as needed., Disp: , Rfl:  .  KLOR-CON M10 10 MEQ tablet, Take 1 tablet by mouth daily., Disp: , Rfl:  .  metoCLOPramide (REGLAN) 10 MG tablet, Take 15 mg by mouth daily as needed. , Disp: , Rfl:  .  Multiple Vitamin (MULITIVITAMIN WITH MINERALS) TABS, Take 1 tablet by mouth daily., Disp: , Rfl:  .  omeprazole (PRILOSEC) 20 MG capsule, Take 20 mg by mouth daily. 30 minutes before breakfast, Disp: , Rfl:  .  Pirfenidone 267 MG CAPS, Take 3 tablets by mouth 3 (three) times daily., Disp: , Rfl:  .  promethazine-codeine (PHENERGAN WITH CODEINE) 6.25-10 MG/5ML syrup, Take 5 mLs by mouth every 6 (six) hours as needed for cough., Disp: 200 mL, Rfl: 0 .  tamsulosin (FLOMAX) 0.4 MG CAPS capsule, Take 0.4 mg by mouth daily., Disp: , Rfl:  .  telmisartan-hydrochlorothiazide (MICARDIS HCT) 80-25 MG per tablet, Take 1 tablet by mouth daily., Disp: , Rfl:     Review of Systems   +ve dyspnea and cough      Objective:   Physical Exam  Filed Vitals:   06/22/14 0916 06/22/14 0919  BP: 112/78 112/78  Pulse: 63   Temp: 98.3 F (36.8 C)   Resp: 17   Height: 5\' 7"  (1.702 m)   Weight: 165 lb 3.2 oz (74.934 kg)   SpO2: 97% 97%  pulse ox is on o2 '  [place] 2 screening  sheet filled out    Assessment & Plan:      ICD-9-CM ICD-10-CM   1. IPF (idiopathic pulmonary fibrosis) 516.31 J84.112 EKG 12-Lead     EKG 12-Lead  2. Patient in clinical research study V70.7 Z00.6     Proceed with [place] 2 ICF and screening process   Dr. Brand Males, M.D., [facility].C.P Pulmonary and Critical Care Medicine Staff Physician [place] [facility] Pager: [phone], If no answer or between  15:00h - 7:00h: call [phone]  07/18/2014  7:10 PM

## 2014-07-19 ENCOUNTER — Ambulatory Visit (INDEPENDENT_AMBULATORY_CARE_PROVIDER_SITE_OTHER): Payer: Federal, State, Local not specified - PPO | Admitting: Internal Medicine

## 2014-07-19 ENCOUNTER — Encounter: Payer: Self-pay | Admitting: Internal Medicine

## 2014-07-19 VITALS — BP 110/68 | HR 64 | Temp 98.3°F | Ht 68.0 in | Wt 174.0 lb

## 2014-07-19 DIAGNOSIS — Z006 Encounter for examination for normal comparison and control in clinical research program: Secondary | ICD-10-CM

## 2014-07-19 DIAGNOSIS — J84112 Idiopathic pulmonary fibrosis: Secondary | ICD-10-CM

## 2014-07-19 MED ORDER — ALPRAZOLAM 0.25 MG PO TABS: 0.2500 mg | ORAL_TABLET | Freq: Every day | ORAL | Status: DC | PRN

## 2014-07-19 NOTE — Patient Instructions (Addendum)
ICD-9-CM ICD-10-CM   1. IPF (idiopathic pulmonary fibrosis) 516.31 J84.112 Spirometry with Graph     EKG 12-Lead     EKG 12-Lead  2. Patient in clinical research study V70.7 Z00.6    Go ahead with ICF and screeening and blood work for AFFERENT study  Stop tessalon for 2 weeks and other cough medications with opioid for 1 weeks BLood work today If screen pass, we will return you in 2 weeks or so Xanax 0.25mg  prn once daily for situational anxiety related to brothers death  - 10 tablets in total

## 2014-07-19 NOTE — Progress Notes (Addendum)
Subjective:    Patient ID: [name] [name], male    DOB: 1935-02-20, 80 y.o.   MRN: [id number]  HPI   PRimary pulm - Dr Baird Lyons at [place], ILD - Dr Chana Bode at [facility]  #IPF  Patient with idiopathic pulmonary fibrosis since 2010. Per review of the [facility] charts:  He was referred there in Oct 2010.  Autoimmune panel in 2011 and 2012 at [facility] was normal. Esophageal manometry November 2012 was normal.  Not sure if he had bx to prove IPF; suspect clinical  Prior therapies for pulmonary fibrosis IPF include at [facility] protocol [phone] trial, testing the use of an IL-13 inhibitor for the treatment of IPF. - developed rash and LFT abnormalities end of study  Current therapy for IPF through [facility] include  - ASCEND tria (pirfenidone v placebo) -> then in [employer] PIPF 012, open label pirfenidone -> then transitioned to commercial drug after it was approved in October 2014   Progression   - he appears to have progressive disease based on data below  PFT - [facility] data FVC fev1 ratio BD fev1 TLC DLCO ct RX comments  [facility]       Possible UIP - [facility]    06/24/2009 at [place] 2.58L/65%     10.2/43%  Ascend protocol in 2011   5.12/2010       Progression +. CT now c/w UIP Open lable pirfenidone in 2012   [facility]/12 [facility] 2.44L/63%     7.3/32%     03/02/14 at [facility]  2.06L/53*% 1.974L/59% 77 x x 6,8/29%     06/16/13 at [place] 2.15/55%        30# weight loss reportted since esbriet stat  07/19/2014 Screen aferent study 2.0L/51% 1.58L/56%       Filed Weights   07/19/14 1418  Weight: 174 lb (78.926 kg)       Performance and symptoms   - uses 2L. Functional. Plays golf. Has severe cough and class 2-3 doe   OV 07/19/2014 - RESEARCH   Chief Complaint  Patient presents with  . Research    Screening for Afferent Study    RESEARCH SUBJECT In - ClinicalTrials.gov Identifier: [id number].  Sponsored by [employer] [phone].  A Phase 2  Randomized Placebo-Controlled, Cross-Over Study to Assess the Efficacy and Safety of AF-219,  P2X3 Receptor Antagonist, in Subjects With Idiopathic Pulmonary Fibrosis (IPF) With Persistent Cough.  Key (but not limited to) [employer] are - 2 weeks of placebo v drug -> 2-3 week washout -> 2 weeks of placebo v drug - AF-219 at 50mg  twice daily dosage v Placebo - Primary outcome: reduction in cough frequency; objective cough monitoring - Secondary outcome: subjective reduction & improvement in cough related quality of life - Side effects: largely taste,   This Visit 07/19/2014 is a  RESEARCH  Visit for SCREENING for AFFERENT STUDY   - NO new issue. HE screen failed another IPF study [place] 2 at last visit Jan 2016 and so is being screened new for this study. HE is not on any other research protocol since the original ASCEND protocol in over a year.  Baseline cough is severe. FVC today is noted above. He is interested in cough study. Went over side effects and potential benefits. He is on cough medications that do not help. He knows he needs to come off that. He also knows his GFR >/= 60 to qualify (in  dec it was 60 locally with PCP).  HIs brother likely died due to IPF Today; was weaned off vent and is in hospice. HE wants some xanax for grief.  Otherewise all baseline. COugh VAS done          Review of Systems  Constitutional: Negative for fever and unexpected weight change.  HENT: Negative for congestion, dental problem, ear pain, nosebleeds, postnasal drip, rhinorrhea, sinus pressure, sneezing, sore throat and trouble swallowing.   Eyes: Negative for redness and itching.  Respiratory: Positive for cough and shortness of breath. Negative for chest tightness and wheezing.   Cardiovascular: Negative for palpitations and leg swelling.  Gastrointestinal: Negative for nausea and vomiting.  Genitourinary: Negative for dysuria.  Musculoskeletal:  Negative for joint swelling.  Skin: Negative for rash.  Neurological: Negative for headaches.  Hematological: Does not bruise/bleed easily.  Psychiatric/Behavioral: Negative for dysphoric mood. The patient is not nervous/anxious.     Current outpatient prescriptions:  .  aspirin 325 MG tablet, Take 162.5 mg by mouth daily., Disp: , Rfl:  .  atenolol (TENORMIN) 25 MG tablet, Take 25 mg by mouth daily., Disp: , Rfl:  .  atorvastatin (LIPITOR) 80 MG tablet, Take 80 mg by mouth daily. , Disp: , Rfl:  .  clotrimazole-betamethasone (LOTRISONE) cream, Apply as directed, Disp: , Rfl:  .  KLOR-CON M10 10 MEQ tablet, Take 1 tablet by mouth daily., Disp: , Rfl:  .  metoCLOPramide (REGLAN) 10 MG tablet, Take 15 mg by mouth daily as needed. , Disp: , Rfl:  .  Multiple Vitamin (MULITIVITAMIN WITH MINERALS) TABS, Take 1 tablet by mouth daily., Disp: , Rfl:  .  omeprazole (PRILOSEC) 20 MG capsule, Take 20 mg by mouth daily. 30 minutes before breakfast, Disp: , Rfl:  .  Pirfenidone 267 MG CAPS, Take 3 tablets by mouth 3 (three) times daily., Disp: , Rfl:  .  tamsulosin (FLOMAX) 0.4 MG CAPS capsule, Take 0.4 mg by mouth daily., Disp: , Rfl:  .  telmisartan-hydrochlorothiazide (MICARDIS HCT) 80-25 MG per tablet, Take 1 tablet by mouth daily., Disp: , Rfl:       Objective:   Physical Exam  Filed Vitals:   07/19/14 1418  BP: 110/68  Pulse: 64  Temp: 98.3 F (36.8 C)  TempSrc: Oral  Height: 5\' 8"  (1.727 m)  Weight: 174 lb (78.926 kg)  SpO2: 98%   Non focaal exam - on o2 and crackles half way up      Assessment & Plan:     ICD-9-CM ICD-10-CM   1. IPF (idiopathic pulmonary fibrosis) 516.31 J84.112 Spirometry with Graph     EKG 12-Lead     EKG 12-Lead  2. Patient in clinical research study V70.7 Z00.6     Go ahead with ICF and screeening and blood work for AFFERENT study  Stop tessalon for 2 weeks and other cough medications with opioid for 1 weeks BLood work today If screen pass, we will  return you in 2 weeks or so Xanax 0.25mg  prn once daily for situational anxiety related to brothers death  - 10 tablets in total   Dr. Brand Males, M.D., [facility].C.P Pulmonary and Critical Care Medicine Staff Physician [place] [facility] Pager: [phone], If no answer or between  15:00h - 7:00h: call [phone]  07/19/2014 3:07 PM

## 2014-07-20 ENCOUNTER — Encounter (HOSPITAL_COMMUNITY): Payer: Federal, State, Local not specified - PPO

## 2014-07-20 DIAGNOSIS — J90 Pleural effusion, not elsewhere classified: Secondary | ICD-10-CM | POA: Insufficient documentation

## 2014-07-20 DIAGNOSIS — I10 Essential (primary) hypertension: Secondary | ICD-10-CM | POA: Insufficient documentation

## 2014-07-20 DIAGNOSIS — R05 Cough: Secondary | ICD-10-CM | POA: Insufficient documentation

## 2014-07-20 DIAGNOSIS — E785 Hyperlipidemia, unspecified: Secondary | ICD-10-CM | POA: Insufficient documentation

## 2014-07-20 DIAGNOSIS — R0600 Dyspnea, unspecified: Secondary | ICD-10-CM | POA: Insufficient documentation

## 2014-07-20 DIAGNOSIS — K219 Gastro-esophageal reflux disease without esophagitis: Secondary | ICD-10-CM | POA: Insufficient documentation

## 2014-07-20 DIAGNOSIS — J8410 Pulmonary fibrosis, unspecified: Secondary | ICD-10-CM | POA: Insufficient documentation

## 2014-07-20 DIAGNOSIS — Z5189 Encounter for other specified aftercare: Secondary | ICD-10-CM | POA: Insufficient documentation

## 2014-07-22 ENCOUNTER — Encounter (HOSPITAL_COMMUNITY): Payer: Self-pay

## 2014-07-27 ENCOUNTER — Encounter (HOSPITAL_COMMUNITY)
Admission: RE | Admit: 2014-07-27 | Discharge: 2014-07-27 | Disposition: A | Discharge status: Home / Self Care | Payer: Self-pay | Source: Ambulatory Visit | Attending: Internal Medicine | Admitting: Internal Medicine

## 2014-07-29 ENCOUNTER — Encounter (HOSPITAL_COMMUNITY)
Admission: RE | Admit: 2014-07-29 | Discharge: 2014-07-29 | Disposition: A | Discharge status: Home / Self Care | Payer: Self-pay | Source: Ambulatory Visit | Attending: Internal Medicine | Admitting: Internal Medicine

## 2014-08-03 ENCOUNTER — Encounter (HOSPITAL_COMMUNITY): Payer: Self-pay

## 2014-08-05 ENCOUNTER — Encounter (HOSPITAL_COMMUNITY)
Admission: RE | Admit: 2014-08-05 | Discharge: 2014-08-05 | Disposition: A | Discharge status: Home / Self Care | Payer: Self-pay | Source: Ambulatory Visit | Attending: Internal Medicine | Admitting: Internal Medicine

## 2014-08-10 ENCOUNTER — Encounter (HOSPITAL_COMMUNITY): Payer: Self-pay

## 2014-08-10 ENCOUNTER — Other Ambulatory Visit: Payer: Self-pay | Admitting: Internal Medicine

## 2014-08-10 DIAGNOSIS — J84112 Idiopathic pulmonary fibrosis: Secondary | ICD-10-CM

## 2014-08-12 ENCOUNTER — Encounter (HOSPITAL_COMMUNITY)
Admission: RE | Admit: 2014-08-12 | Discharge: 2014-08-12 | Disposition: A | Discharge status: Home / Self Care | Payer: Self-pay | Source: Ambulatory Visit | Attending: Internal Medicine | Admitting: Internal Medicine

## 2014-08-17 ENCOUNTER — Encounter (HOSPITAL_COMMUNITY): Payer: Self-pay

## 2014-08-18 ENCOUNTER — Ambulatory Visit (INDEPENDENT_AMBULATORY_CARE_PROVIDER_SITE_OTHER): Payer: Medicare Other | Admitting: Critical Care Medicine

## 2014-08-18 DIAGNOSIS — J84112 Idiopathic pulmonary fibrosis: Secondary | ICD-10-CM

## 2014-08-19 ENCOUNTER — Encounter (HOSPITAL_COMMUNITY)
Admission: RE | Admit: 2014-08-19 | Discharge: 2014-08-19 | Disposition: A | Discharge status: Home / Self Care | Payer: Self-pay | Source: Ambulatory Visit | Attending: Internal Medicine | Admitting: Internal Medicine

## 2014-08-19 ENCOUNTER — Encounter: Payer: Self-pay | Admitting: Critical Care Medicine

## 2014-08-19 NOTE — Progress Notes (Signed)
Subjective:    Patient ID: [name] [name], male    DOB: 01/29/1935, 80 y.o.   MRN: [id number]  HPI   PRimary pulm - Dr Baird Lyons at [place], ILD - Dr Chana Bode at [facility]  #IPF  Patient with idiopathic pulmonary fibrosis since 2010. Per review of the [facility] charts:  He was referred there in Oct 2010.  Autoimmune panel in 2011 and 2012 at [facility] was normal. Esophageal manometry November 2012 was normal.  Not sure if he had bx to prove IPF; suspect clinical  Prior therapies for pulmonary fibrosis IPF include at [facility] protocol [phone] trial, testing the use of an IL-13 inhibitor for the treatment of IPF. - developed rash and LFT abnormalities end of study  Current therapy for IPF through [facility] include  - ASCEND tria (pirfenidone v placebo) -> then in [employer] PIPF 012, open label pirfenidone -> then transitioned to commercial drug after it was approved in October 2014   Progression   - he appears to have progressive disease based on data below  PFT - [facility] data FVC fev1 ratio BD fev1 TLC DLCO ct RX comments  [facility]       Possible UIP - [facility]    06/24/2009 at [place] 2.58L/65%     10.2/43%  Ascend protocol in 2011   5.12/2010       Progression +. CT now c/w UIP Open lable pirfenidone in 2012   [facility]/28/12 [facility] 2.44L/63%     7.3/32%     [facility]/02/14 [facility]  2.06L/53*% 1.974L/59% 77 x x 6,8/29%     06/16/13 at [place] 2.15/55%        30# weight loss reportted since esbriet stat  08/19/2014 Screen aferent study 2.0L/51% 1.58L/56%       There were no vitals filed for this visit.     Performance and symptoms   - uses 2L. Functional. Plays golf. Has severe cough and class 2-3 doe   OV 08/19/2014 - RESEARCH   No chief complaint on file.   RESEARCH SUBJECT In - ClinicalTrials.gov Identifier: [id number].  Sponsored by [employer] [phone].  A Phase 2  Randomized Placebo-Controlled,  Cross-Over Study to Assess the Efficacy and Safety of AF-219,  P2X3 Receptor Antagonist, in Subjects With Idiopathic Pulmonary Fibrosis (IPF) With Persistent Cough.  Key (but not limited to) [employer] are - 2 weeks of placebo v drug -> 2-3 week washout -> 2 weeks of placebo v drug - AF-219 at 50mg  twice daily dosage v Placebo - Primary outcome: reduction in cough frequency; objective cough monitoring - Secondary outcome: subjective reduction & improvement in cough related quality of life - Side effects: largely taste,   This Visit 07/19/14          is a  RESEARCH  Visit for SCREENING   - NO new issue. Baseline cough is severe. FVC today is noted above. He is interested in cough study. Went over side effects and potential benefits. He is on cough medications that do not help. He knows he needs to come off that. He also knows his GFR >/= 60 to qualify (in dec it was 60 locally with PCP).  HIs brother likely died due to IPF Today; was weaned off vent and is in hospice. HE wants some xanax for grief.  Otherewise all baseline. COugh VAS done     RESEARCH VISIT 08/18/14 Pt in affrent trial.  No change in  cough. No side effect issues. pulm symptoms at baseline. Remains on esbriet.  Pt entering washout period  Pt denies any significant sore throat, nasal congestion or excess secretions, fever, chills, sweats, unintended weight loss, pleurtic or exertional chest pain, orthopnea PND, or leg swelling Pt denies any increase in rescue therapy over baseline, denies waking up needing it or having any early am or nocturnal exacerbations of coughing/wheezing/or dyspnea. Pt also denies any obvious fluctuation in symptoms with  weather or environmental change or other alleviating or aggravating factors       Review of Systems  Constitutional: Negative for fever and unexpected weight change.  HENT: Negative for congestion, dental problem, ear pain, nosebleeds, postnasal drip, rhinorrhea, sinus pressure,  sneezing, sore throat and trouble swallowing.   Eyes: Negative for redness and itching.  Respiratory: Positive for cough and shortness of breath. Negative for chest tightness and wheezing.   Cardiovascular: Negative for palpitations and leg swelling.  Gastrointestinal: Negative for nausea and vomiting.  Genitourinary: Negative for dysuria.  Musculoskeletal: Negative for joint swelling.  Skin: Negative for rash.  Neurological: Negative for headaches.  Hematological: Does not bruise/bleed easily.  Psychiatric/Behavioral: Negative for dysphoric mood. The patient is not nervous/anxious.     Current outpatient prescriptions:  .  ALPRAZolam (XANAX) 0.25 MG tablet, Take 1 tablet (0.25 mg total) by mouth daily as needed for anxiety., Disp: 10 tablet, Rfl: 0 .  aspirin 325 MG tablet, Take 162.5 mg by mouth daily., Disp: , Rfl:  .  atenolol (TENORMIN) 25 MG tablet, Take 25 mg by mouth daily., Disp: , Rfl:  .  atorvastatin (LIPITOR) 80 MG tablet, Take 80 mg by mouth daily. , Disp: , Rfl:  .  clotrimazole-betamethasone (LOTRISONE) cream, Apply as directed, Disp: , Rfl:  .  KLOR-CON M10 10 MEQ tablet, Take 1 tablet by mouth daily., Disp: , Rfl:  .  metoCLOPramide (REGLAN) 10 MG tablet, Take 15 mg by mouth daily as needed. , Disp: , Rfl:  .  Multiple Vitamin (MULITIVITAMIN WITH MINERALS) TABS, Take 1 tablet by mouth daily., Disp: , Rfl:  .  omeprazole (PRILOSEC) 20 MG capsule, Take 20 mg by mouth daily. 30 minutes before breakfast, Disp: , Rfl:  .  Pirfenidone 267 MG CAPS, Take 3 tablets by mouth 3 (three) times daily., Disp: , Rfl:  .  tamsulosin (FLOMAX) 0.4 MG CAPS capsule, Take 0.4 mg by mouth daily., Disp: , Rfl:  .  telmisartan-hydrochlorothiazide (MICARDIS HCT) 80-25 MG per tablet, Take 1 tablet by mouth daily., Disp: , Rfl:       Objective:   Physical Exam  There were no vitals filed for this visit. There were no vitals filed for this visit.  Gen: Pleasant, well-nourished, in no  distress,  normal affect  ENT: No lesions,  mouth clear,  oropharynx clear, no postnasal drip  Neck: No JVD, no TMG, no carotid bruits  Lungs: No use of accessory muscles, no dullness to percussion, dry bibasilar rales, Ncs  Cardiovascular: RRR, heart sounds normal, no murmur or gallops, no peripheral edema  Abdomen: soft and NT, no HSM,  BS normal  Musculoskeletal: No deformities, no cyanosis or clubbing  Neuro: alert, non focal  Skin: Warm, no lesions or rashes  No results found. ecg NCS      Assessment & Plan:     ICD-9-CM ICD-10-CM   1. IPF (idiopathic pulmonary fibrosis) 516.31 J84.112 EKG 12-Lead    Proceed with trial and washout period No adverse effects for first phase of  trial  Saralyn Pilar WrightMD   08/19/2014 8:46 AM

## 2014-08-24 ENCOUNTER — Encounter (HOSPITAL_COMMUNITY)
Admission: RE | Admit: 2014-08-24 | Discharge: 2014-08-24 | Disposition: A | Discharge status: Home / Self Care | Payer: Self-pay | Source: Ambulatory Visit | Attending: Internal Medicine | Admitting: Internal Medicine

## 2014-08-24 DIAGNOSIS — E785 Hyperlipidemia, unspecified: Secondary | ICD-10-CM | POA: Insufficient documentation

## 2014-08-24 DIAGNOSIS — I10 Essential (primary) hypertension: Secondary | ICD-10-CM | POA: Insufficient documentation

## 2014-08-24 DIAGNOSIS — R0600 Dyspnea, unspecified: Secondary | ICD-10-CM | POA: Insufficient documentation

## 2014-08-24 DIAGNOSIS — J8410 Pulmonary fibrosis, unspecified: Secondary | ICD-10-CM | POA: Insufficient documentation

## 2014-08-24 DIAGNOSIS — R05 Cough: Secondary | ICD-10-CM | POA: Insufficient documentation

## 2014-08-24 DIAGNOSIS — K219 Gastro-esophageal reflux disease without esophagitis: Secondary | ICD-10-CM | POA: Insufficient documentation

## 2014-08-24 DIAGNOSIS — J90 Pleural effusion, not elsewhere classified: Secondary | ICD-10-CM | POA: Insufficient documentation

## 2014-08-24 DIAGNOSIS — Z5189 Encounter for other specified aftercare: Secondary | ICD-10-CM | POA: Insufficient documentation

## 2014-08-26 ENCOUNTER — Encounter: Payer: Self-pay | Admitting: Internal Medicine

## 2014-08-26 ENCOUNTER — Encounter (HOSPITAL_COMMUNITY)
Admission: RE | Admit: 2014-08-26 | Discharge: 2014-08-26 | Disposition: A | Discharge status: Home / Self Care | Payer: Self-pay | Source: Ambulatory Visit | Attending: Internal Medicine | Admitting: Internal Medicine

## 2014-08-26 DIAGNOSIS — Z8551 Personal history of malignant neoplasm of bladder: Secondary | ICD-10-CM

## 2014-08-26 HISTORY — DX: Personal history of malignant neoplasm of bladder: Z85.51

## 2014-08-31 ENCOUNTER — Encounter: Payer: Self-pay | Admitting: Internal Medicine

## 2014-08-31 ENCOUNTER — Encounter (HOSPITAL_COMMUNITY): Payer: Self-pay

## 2014-08-31 ENCOUNTER — Ambulatory Visit (INDEPENDENT_AMBULATORY_CARE_PROVIDER_SITE_OTHER): Payer: Self-pay | Admitting: Internal Medicine

## 2014-08-31 VITALS — BP 126/78 | HR 63 | Temp 98.2°F | Resp 16 | Wt 166.0 lb

## 2014-08-31 DIAGNOSIS — Z006 Encounter for examination for normal comparison and control in clinical research program: Secondary | ICD-10-CM

## 2014-08-31 DIAGNOSIS — J84112 Idiopathic pulmonary fibrosis: Secondary | ICD-10-CM

## 2014-08-31 NOTE — Patient Instructions (Addendum)
ICD-9-CM ICD-10-CM   1. Patient in clinical research study V70.7 Z00.6   2. IPF (idiopathic pulmonary fibrosis) 516.31 J84.112 EKG 12-Lead     EKG 12-Lead   Vital0-JAK placement today and blood work And follow protocol

## 2014-08-31 NOTE — Progress Notes (Signed)
RESEARCH SUBJECT In - ClinicalTrials.gov Identifier: [id number].  Sponsored by [employer] [phone].  A Phase 2  Randomized Placebo-Controlled, Cross-Over Study to Assess the Efficacy and Safety of AF-219,  P2X3 Receptor Antagonist, in Subjects With Idiopathic Pulmonary Fibrosis (IPF) With Persistent Cough.  Key (but not limited to) [employer] are - 2 weeks of placebo v drug -> 2-3 week washout -> 2 weeks of placebo v drug - AF-219 at 50mg  twice daily dosage v Placebo - Primary outcome: reduction in cough frequency; objective cough monitoring - Secondary outcome: subjective reduction & improvement in cough related quality of life - Side effects (main and not comprehensive)  - <10%: flank pain, cough, nasal dryness, rhinitis, dry mouth   - <50%: oral side effects ranging from dysgeusia, paresthesia, hypoestheia, hypogeusia, and ageusia - tends to attenuate over time  - published study in Lancet at higher dose of 650mb bid: drop out rate of 25%  - at lower dose: drop out rate believed to be very low    This Visit 08/31/2014 is a  Research  Visit for treatment and is VISIT NUMBER 7 on protocol.    S Subject is doing well, when asked if he was still experiencing whatever kind of taste problem he was having, the patient stated, it is better.  He was asked what type of problem he was experiencing with his taste and he stated, nothing tasted right, kindof salty like.  The AE sheet will be updated. Overall cough is severe and persisent and unchanged.    O Filed Vitals:   08/31/14 0910  BP: 126/78  Pulse: 63  Temp: 98.2 F (36.8 C)  TempSrc: Oral  Resp: 16  Weight: 166 lb (75.297 kg)  SpO2: 96%   Exam- see sheet. Half way crackles in back of chest - unchanged  A/P   ICD-9-CM ICD-10-CM   1. Patient in clinical research study V70.7 Z00.6   2. IPF (idiopathic pulmonary fibrosis) 516.31 J84.112 EKG 12-Lead     EKG 12-Lead   Vitalo-JAK placement today  08/31/2014  Done just after 41OI due to devlice not working COmpleted all  questionnaires REturn in 24h per protocol - study drug tomorrow       Dr. Brand Males, M.D., [facility].C.P Pulmonary and Critical Care Medicine Staff Physician [place] [facility] Pager: [phone], If no answer or between  15:00h - 7:00h: call [phone]  08/31/2014 9:52 AM

## 2014-09-02 ENCOUNTER — Encounter (HOSPITAL_COMMUNITY)
Admission: RE | Admit: 2014-09-02 | Discharge: 2014-09-02 | Disposition: A | Discharge status: Home / Self Care | Payer: Self-pay | Source: Ambulatory Visit | Attending: Internal Medicine | Admitting: Internal Medicine

## 2014-09-06 ENCOUNTER — Telehealth: Payer: Self-pay | Admitting: Internal Medicine

## 2014-09-06 ENCOUNTER — Encounter: Payer: Self-pay | Admitting: Internal Medicine

## 2014-09-06 NOTE — Telephone Encounter (Signed)
[name] [name], CCRP spoke with patient wife in the presence of PI, Dr. Chase Caller OOOOON 08/26/14 AND DOCUMENTED IN CHART IN PAST HX SECTION. [name]  AFFERENT cough IPF study:  Wife narrated, "bladder Cancer in 2010...states, had a small spot removed, no additional treatment required and is in complete remission"  This is being put in phone note to document th e phone coversation took place and is more visible information   Dr. Brand Males, M.D., [facility].C.P Pulmonary and Critical Care Medicine Staff Physician [place] [facility] Pager: [phone], If no answer or between  15:00h - 7:00h: call [phone]  09/06/2014 2:59 PM

## 2014-09-07 ENCOUNTER — Encounter (HOSPITAL_COMMUNITY)
Admission: RE | Admit: 2014-09-07 | Discharge: 2014-09-07 | Disposition: A | Discharge status: Home / Self Care | Payer: Self-pay | Source: Ambulatory Visit | Attending: Internal Medicine | Admitting: Internal Medicine

## 2014-09-08 ENCOUNTER — Other Ambulatory Visit: Payer: Self-pay | Admitting: Internal Medicine

## 2014-09-08 DIAGNOSIS — J84112 Idiopathic pulmonary fibrosis: Secondary | ICD-10-CM

## 2014-09-09 ENCOUNTER — Encounter (HOSPITAL_COMMUNITY): Payer: Self-pay

## 2014-09-14 ENCOUNTER — Encounter (HOSPITAL_COMMUNITY)
Admission: RE | Admit: 2014-09-14 | Discharge: 2014-09-14 | Disposition: A | Discharge status: Home / Self Care | Payer: Self-pay | Source: Ambulatory Visit | Attending: Internal Medicine | Admitting: Internal Medicine

## 2014-09-15 ENCOUNTER — Telehealth: Payer: Self-pay | Admitting: Internal Medicine

## 2014-09-15 ENCOUNTER — Other Ambulatory Visit: Payer: Self-pay | Admitting: Internal Medicine

## 2014-09-15 DIAGNOSIS — Z006 Encounter for examination for normal comparison and control in clinical research program: Secondary | ICD-10-CM

## 2014-09-15 DIAGNOSIS — J8410 Pulmonary fibrosis, unspecified: Secondary | ICD-10-CM

## 2014-09-15 NOTE — Telephone Encounter (Addendum)
  creat 1.18mg %, gfr 60 12/23/2-15    bun creat gfr comments  05/12/2014 -prestudy with pcp  1.18mg % 60   07/22/2014 screening 12 1.08mg % 66   08/09/2014 11 1.21 57   08/18/2014 13 1.18 59   08/31/2014 -end of washout without study drug x 2 weeks 14 1.20mg % 58   09/08/2014 - visit 9, study drug 14 1.31 52 Adverse Events    Daneil Dan   Let him know that mild bump in creatinine 2d ago after holding steady for duration of study.  It is an AE and I have told [facility] Claudia Desanctis Let patient know to keep himself hydrate; not related to study drug I think  Dr. Brand Males, M.D., [facility].C.P Pulmonary and Critical Care Medicine Staff Physician [place] [facility] Pager: [phone], If no answer or between  15:00h - 7:00h: call [phone]  09/15/2014 4:36 PM

## 2014-09-15 NOTE — Telephone Encounter (Signed)
lmtcb for pt.  

## 2014-09-16 ENCOUNTER — Encounter (HOSPITAL_COMMUNITY): Payer: Self-pay

## 2014-09-16 ENCOUNTER — Ambulatory Visit (INDEPENDENT_AMBULATORY_CARE_PROVIDER_SITE_OTHER): Payer: Medicare Other | Admitting: Internal Medicine

## 2014-09-16 ENCOUNTER — Encounter: Payer: Federal, State, Local not specified - PPO | Admitting: Internal Medicine

## 2014-09-16 VITALS — BP 112/56 | HR 56 | Temp 97.9°F | Resp 16 | Wt 164.5 lb

## 2014-09-16 DIAGNOSIS — J84112 Idiopathic pulmonary fibrosis: Secondary | ICD-10-CM

## 2014-09-16 DIAGNOSIS — Z006 Encounter for examination for normal comparison and control in clinical research program: Secondary | ICD-10-CM

## 2014-09-16 DIAGNOSIS — R7989 Other specified abnormal findings of blood chemistry: Secondary | ICD-10-CM

## 2014-09-16 DIAGNOSIS — R748 Abnormal levels of other serum enzymes: Secondary | ICD-10-CM

## 2014-09-16 NOTE — Patient Instructions (Addendum)
ICD-9-CM ICD-10-CM   1. Patient in clinical research study V70.7 Z00.6   2. IPF (idiopathic pulmonary fibrosis) 516.31 J84.112   3. Creatinine elevation 790.99 R74.8    3. REcent lab tests show creatining as folloows - Rising - Probably lab variation V True Rise. Unlikely related to study drug but we need to monitor closely.  4. Shortness of breath worse - mild per your statement - likely disease progression   Followup   - lab work today 09/16/2014 - will call with results  - per protocol

## 2014-09-16 NOTE — Progress Notes (Signed)
RESEARCH SUBJECT In - ClinicalTrials.gov Identifier: [id number].  Sponsored by [employer] [phone].  A Phase 2  Randomized Placebo-Controlled, Cross-Over Study to Assess the Efficacy and Safety of AF-219,  P2X3 Receptor Antagonist, in Subjects With Idiopathic Pulmonary Fibrosis (IPF) With Persistent Cough.  Key (but not limited to) [employer] are - 2 weeks of placebo v drug -> 2-3 week washout -> 2 weeks of placebo v drug - AF-219 at $RemoveB'50mg'PxKZUDbg$  twice daily dosage v Placebo - Primary outcome: reduction in cough frequency; objective cough monitoring - Secondary outcome: subjective reduction & improvement in cough related quality of life - Side effects (main and not comprehensive)  - <10%: flank pain, cough, nasal dryness, rhinitis, dry mouth   - <50%: oral side effects ranging from dysgeusia, paresthesia, hypoestheia, hypogeusia, and ageusia - tends to attenuate over time  - published study in Lancet at higher dose of 625mb bid: drop out rate of 25%  - at lower dose: drop out rate believed to be very low   Subject is [name] [name] 1935-04-09 and is Subject Number [id number] on the protocol. This Visit on  09/16/2014 is a  Research Visit for  End of Treatment  and is VISIT NUMBER 11 on protocol.       Subject Details / Chief Complaint Subject is [name] [name] 11/13/1934 and is Subject Number [id number] on the protocol. This Visit is 09/16/2014 is a  research Visit for  End of Treatment and is VISIT NUMBER 11  on protocol.  Subject is doing well, Cough monitor removed, all outstanding queries addressed.  He does not have any questions at this time.  He will return in 2 weeks for Visit 12 follow up.   Overall dyspnea mildly worrse progressive over weeks/months  Objective Vitals Filed Vitals:   09/16/14 0903  BP: 112/56  Pulse: 56  Temp: 97.9 F (36.6 C)  TempSrc: Oral  Resp: 16  Weight: 74.617 kg (164 lb 8 oz)  SpO2: 94%    Exam  - done in shadow  research chart  LABS  creat 1.$Remove'18mg'wpAROic$ %, gfr 60 12/23/2-15    bun creat gfr comments  05/12/2014 -prestudy with pcp  1.$Remo'18mg'UVdMd$ % 60   07/22/2014 screening 12 1.$RemoveBefore'08mg'LZKRkmOMQvbsF$ % 66   08/09/2014 11 1.21 57   08/18/2014 13 1.18 59   08/31/2014 -end of washout without study drug x 2 weeks 14 1.$RemoveBe'20mg'CyabIbefa$ % 58   09/08/2014 - visit 9, study drug 14 1.31 52 Adverse Events           A/P   ICD-9-CM ICD-10-CM   1. Patient in clinical research study V70.7 Z00.6   2. IPF (idiopathic pulmonary fibrosis) 516.31 J84.112   3. Creatinine elevation 790.99 R74.8    IPF - likely has progressed based on worsening dyspnea. Worsening dyspnea Not study drug related   Creat elevation - based on below - he does NOT met threshold for Acute Renal Injury but still has significant creatinine elevation over time. Could be study drug related. I would flag it for monitoring as an AE. It is a mild edlevation only. Need to see what lab work 09/16/14 shows to make a determination about AKI/ATN threshold. Have advised him to keep up with hydration   Acute Kidney Injury (any one) Increase in SCr by > 0.3 within 48 hours Increase SCr to > 1.5 times baseline Urine volume < 0.5 ml/kg/h for 6 hrs  Stage: Risk: 1.5x increase in creatinine or GFR decrease by 25% or UOP <0.75ml/kgperhr for 6 hrs Injury:  2x increase in creatinine or GFR decrease by 50% or UOP < 0.93ml/kgperhr for 12 hr Failure:3X increase in creatinine or GFR decrease by 75% or UOP < 0.27ml/kgperhr for 12 hr or  anuria 12 hrs Loss: complete loss of kidney function for more then 4 weeks End-stage renal disease:Complete loss of kidney function for more then 3 months   Dr. Brand Males, M.D., [facility].C.P Pulmonary and Critical Care Medicine Staff Physician [place] [facility] Pager: [phone],  If no answer or between  15:00h - 7:00h: call [phone]  09/30/2014 3:55 AM

## 2014-09-17 NOTE — Telephone Encounter (Signed)
lmtcb for pt.  

## 2014-09-20 ENCOUNTER — Telehealth: Payer: Self-pay | Admitting: Internal Medicine

## 2014-09-21 ENCOUNTER — Encounter (HOSPITAL_COMMUNITY)
Admission: RE | Admit: 2014-09-21 | Discharge: 2014-09-21 | Disposition: A | Discharge status: Home / Self Care | Payer: Self-pay | Source: Ambulatory Visit | Attending: Internal Medicine | Admitting: Internal Medicine

## 2014-09-21 DIAGNOSIS — R05 Cough: Secondary | ICD-10-CM | POA: Insufficient documentation

## 2014-09-21 DIAGNOSIS — K219 Gastro-esophageal reflux disease without esophagitis: Secondary | ICD-10-CM | POA: Insufficient documentation

## 2014-09-21 DIAGNOSIS — R0600 Dyspnea, unspecified: Secondary | ICD-10-CM | POA: Insufficient documentation

## 2014-09-21 DIAGNOSIS — E785 Hyperlipidemia, unspecified: Secondary | ICD-10-CM | POA: Insufficient documentation

## 2014-09-21 DIAGNOSIS — J8410 Pulmonary fibrosis, unspecified: Secondary | ICD-10-CM | POA: Insufficient documentation

## 2014-09-21 DIAGNOSIS — I10 Essential (primary) hypertension: Secondary | ICD-10-CM | POA: Insufficient documentation

## 2014-09-21 DIAGNOSIS — J90 Pleural effusion, not elsewhere classified: Secondary | ICD-10-CM | POA: Insufficient documentation

## 2014-09-21 DIAGNOSIS — Z5189 Encounter for other specified aftercare: Secondary | ICD-10-CM | POA: Insufficient documentation

## 2014-09-21 NOTE — Telephone Encounter (Signed)
Pt aware of results.  Nothing further needed.  

## 2014-09-21 NOTE — Telephone Encounter (Signed)
Pt is returning call, please see phone note from 4/27 [phone]

## 2014-09-21 NOTE — Telephone Encounter (Signed)
Spoke with pt's wife.  She states that someone called them regarding lab results but states Hoyle Sauer from research said it wasn't her.  Daneil Dan please advised what we need to tell this pt.

## 2014-09-21 NOTE — Telephone Encounter (Signed)
Let him know that mild bump in creatinine 2d ago after holding steady for duration of study.  It is an AE and I have told CRC Claudia Desanctis Let patient know to keep himself hydrate; not related to study drug I think --  I spoke with patient about results and he verbalized understanding and had no questions

## 2014-09-22 ENCOUNTER — Telehealth: Payer: Self-pay | Admitting: Internal Medicine

## 2014-09-22 ENCOUNTER — Encounter: Payer: Federal, State, Local not specified - PPO | Admitting: Adult Health

## 2014-09-22 NOTE — Telephone Encounter (Signed)
Let patient [name] [name] know that creatinine from last reseerch visit end April 2016 has normalized to 1.1mg % his baseline

## 2014-09-22 NOTE — Telephone Encounter (Signed)
Called and spoke to pt. Informed pt of the results per MR. Pt verbalized understanding and denied any further questions or concerns at this time.  

## 2014-09-23 ENCOUNTER — Encounter (HOSPITAL_COMMUNITY)
Admission: RE | Admit: 2014-09-23 | Discharge: 2014-09-23 | Disposition: A | Discharge status: Home / Self Care | Payer: Self-pay | Source: Ambulatory Visit | Attending: Internal Medicine | Admitting: Internal Medicine

## 2014-09-27 ENCOUNTER — Ambulatory Visit (INDEPENDENT_AMBULATORY_CARE_PROVIDER_SITE_OTHER): Payer: Medicare Other | Admitting: Adult Health

## 2014-09-27 ENCOUNTER — Other Ambulatory Visit: Payer: Self-pay | Admitting: Internal Medicine

## 2014-09-27 ENCOUNTER — Encounter: Payer: Self-pay | Admitting: Adult Health

## 2014-09-27 VITALS — BP 142/72 | HR 76 | Temp 97.3°F | Resp 17 | Wt 164.0 lb

## 2014-09-27 DIAGNOSIS — Z006 Encounter for examination for normal comparison and control in clinical research program: Secondary | ICD-10-CM

## 2014-09-27 DIAGNOSIS — J84112 Idiopathic pulmonary fibrosis: Secondary | ICD-10-CM | POA: Diagnosis not present

## 2014-09-27 NOTE — Assessment & Plan Note (Signed)
No perceived benefit in cough during study  Last visit for study , interested in study extension if available

## 2014-09-27 NOTE — Progress Notes (Signed)
   Subjective:    Patient ID: [name] [name], male    DOB: 05-08-1935, 80 y.o.   MRN: [id number]  HPI  [id number]: ClinicalTrials.gov Identifier: [id number]. Research Sponsored by [employer] [phone]. A Phase 2 Randomized Placebo-Controlled, Cross-Over Study to Assess the Efficacy and Safety of AF-219, P2X3 Receptor Antagonist, in Subjects With Idiopathic Pulmonary Fibrosis (IPF) With Persistent Cough.  Key (but not limited to) [employer] are  - 2 weeks of placebo v drug -> 2-3 week washout -> 2 weeks of placebo v drug  - AF-219 at 50mg  twice daily dosage v Placebo  - Primary outcome: reduction in cough frequency; objective cough monitoring  - Secondary outcome: subjective reduction & improvement in cough related quality of life  - Side effects (main and not comprehensive)  - <10%: flank pain, cough, nasal dryness, rhinitis, dry mouth  - <50%: oral side effects ranging from dysgeusia, paresthesia, hypoestheia, hypogeusia, and ageusia - tends to attenuate over time  - published study in Lancet at higher dose of 657mb bid: drop out rate of 25%  - at lower dose: drop out rate believed to be very low  Subject is [name] [name] 31-Aug-1934 and is Subject Number [id number] on the protocol. This Visit is 09/27/2014 is a research Visit for end of study and is VISIT NUMBER 12 on protocol. Subject is doing well, he is interested in study extension when info becomes available  Pt says he has not noticed a difference in his cough .  No flare of cough or baseline dyspnea.  EKG reviewed .  Labs today .    Review of Systems See HPI      Objective:   Physical Exam  GEN: A/Ox3; pleasant , NAD, elderly and chronically ill appearing   HEENT:  [place]/AT,  EACs-clear, TMs-wnl, NOSE-clear, THROAT-clear, no lesions, no postnasal drip or exudate noted.   NECK:  Supple w/ fair ROM; no JVD; normal carotid impulses w/o bruits; no thyromegaly or nodules palpated; no  lymphadenopathy.  RESP  Bibasilar crackles, no accessory muscle use, no dullness to percussion  CARD:  RRR, no m/r/g  , no peripheral edema, pulses intact, no cyanosis or clubbing.  GI:   Soft & nt; nml bowel sounds; no organomegaly or masses detected.  Musco: Warm bil, no deformities or joint swelling noted.   Neuro: alert, no focal deficits noted.    Skin: Warm, no lesions or rashes        Assessment & Plan:

## 2014-09-27 NOTE — Assessment & Plan Note (Signed)
Stable without flare  End of research study  Interested in study extension if available

## 2014-09-27 NOTE — Progress Notes (Signed)
[id number]: ClinicalTrials.gov Identifier: [id number]. Research Sponsored by [employer] [phone].  A Phase 2  Randomized Placebo-Controlled, Cross-Over Study to Assess the Efficacy and Safety of AF-219,  P2X3 Receptor Antagonist, in Subjects With Idiopathic Pulmonary Fibrosis (IPF) With Persistent Cough.  Key (but not limited to) [employer] are - 2 weeks of placebo v drug -> 2-3 week washout -> 2 weeks of placebo v drug - AF-219 at 50mg  twice daily dosage v Placebo - Primary outcome: reduction in cough frequency; objective cough monitoring - Secondary outcome: subjective reduction & improvement in cough related quality of life - Side effects (main and not comprehensive)  - <10%: flank pain, cough, nasal dryness, rhinitis, dry mouth   - <50%: oral side effects ranging from dysgeusia, paresthesia, hypoestheia, hypogeusia, and ageusia - tends to attenuate over time  - published study in Lancet at higher dose of 660mb bid: drop out rate of 25%  - at lower dose: drop out rate believed to be very low   Subject is [name] [name] 08-21-1934 and is Subject Number [id number] on the protocol. This Visit is 09/27/2014 is a research Visit for end of study and is VISIT NUMBER 12 on protocol.  Subject is doing well, he is interested in study extension when info becomes available

## 2014-09-28 ENCOUNTER — Encounter (HOSPITAL_COMMUNITY): Payer: Self-pay

## 2014-09-28 ENCOUNTER — Encounter: Payer: Federal, State, Local not specified - PPO | Admitting: Adult Health

## 2014-09-29 ENCOUNTER — Encounter: Payer: Federal, State, Local not specified - PPO | Admitting: Adult Health

## 2014-09-30 ENCOUNTER — Encounter (HOSPITAL_COMMUNITY): Payer: Self-pay

## 2014-09-30 DIAGNOSIS — R7989 Other specified abnormal findings of blood chemistry: Secondary | ICD-10-CM | POA: Insufficient documentation

## 2014-09-30 DIAGNOSIS — N289 Disorder of kidney and ureter, unspecified: Secondary | ICD-10-CM | POA: Insufficient documentation

## 2014-10-05 ENCOUNTER — Encounter (HOSPITAL_COMMUNITY): Payer: Self-pay

## 2014-10-07 ENCOUNTER — Encounter (HOSPITAL_COMMUNITY): Payer: Self-pay

## 2014-10-12 ENCOUNTER — Encounter (HOSPITAL_COMMUNITY)
Admission: RE | Admit: 2014-10-12 | Discharge: 2014-10-12 | Disposition: A | Discharge status: Home / Self Care | Payer: Self-pay | Source: Ambulatory Visit | Attending: Internal Medicine | Admitting: Internal Medicine

## 2014-10-14 ENCOUNTER — Encounter (HOSPITAL_COMMUNITY)
Admission: RE | Admit: 2014-10-14 | Discharge: 2014-10-14 | Disposition: A | Discharge status: Home / Self Care | Payer: Self-pay | Source: Ambulatory Visit | Attending: Internal Medicine | Admitting: Internal Medicine

## 2014-10-19 ENCOUNTER — Encounter (HOSPITAL_COMMUNITY): Payer: Self-pay

## 2014-10-21 ENCOUNTER — Encounter (HOSPITAL_COMMUNITY)
Admission: RE | Admit: 2014-10-21 | Discharge: 2014-10-21 | Disposition: A | Discharge status: Home / Self Care | Payer: Self-pay | Source: Ambulatory Visit | Attending: Internal Medicine | Admitting: Internal Medicine

## 2014-10-21 DIAGNOSIS — K219 Gastro-esophageal reflux disease without esophagitis: Secondary | ICD-10-CM | POA: Insufficient documentation

## 2014-10-21 DIAGNOSIS — E785 Hyperlipidemia, unspecified: Secondary | ICD-10-CM | POA: Insufficient documentation

## 2014-10-21 DIAGNOSIS — J90 Pleural effusion, not elsewhere classified: Secondary | ICD-10-CM | POA: Insufficient documentation

## 2014-10-21 DIAGNOSIS — R05 Cough: Secondary | ICD-10-CM | POA: Insufficient documentation

## 2014-10-21 DIAGNOSIS — R0600 Dyspnea, unspecified: Secondary | ICD-10-CM | POA: Insufficient documentation

## 2014-10-21 DIAGNOSIS — Z5189 Encounter for other specified aftercare: Secondary | ICD-10-CM | POA: Insufficient documentation

## 2014-10-21 DIAGNOSIS — I10 Essential (primary) hypertension: Secondary | ICD-10-CM | POA: Insufficient documentation

## 2014-10-21 DIAGNOSIS — J8410 Pulmonary fibrosis, unspecified: Secondary | ICD-10-CM | POA: Insufficient documentation

## 2014-10-26 ENCOUNTER — Encounter (HOSPITAL_COMMUNITY)
Admission: RE | Admit: 2014-10-26 | Discharge: 2014-10-26 | Disposition: A | Discharge status: Home / Self Care | Payer: Self-pay | Source: Ambulatory Visit | Attending: Internal Medicine | Admitting: Internal Medicine

## 2014-10-28 ENCOUNTER — Encounter (HOSPITAL_COMMUNITY)
Admission: RE | Admit: 2014-10-28 | Discharge: 2014-10-28 | Disposition: A | Discharge status: Home / Self Care | Payer: Self-pay | Source: Ambulatory Visit | Attending: Internal Medicine | Admitting: Internal Medicine

## 2014-11-02 ENCOUNTER — Encounter (HOSPITAL_COMMUNITY): Payer: Self-pay

## 2014-11-02 DIAGNOSIS — J84112 Idiopathic pulmonary fibrosis: Secondary | ICD-10-CM | POA: Diagnosis not present

## 2014-11-04 ENCOUNTER — Encounter (HOSPITAL_COMMUNITY)
Admission: RE | Admit: 2014-11-04 | Discharge: 2014-11-04 | Disposition: A | Discharge status: Home / Self Care | Payer: Self-pay | Source: Ambulatory Visit | Attending: Internal Medicine | Admitting: Internal Medicine

## 2014-11-09 ENCOUNTER — Encounter (HOSPITAL_COMMUNITY)
Admission: RE | Admit: 2014-11-09 | Discharge: 2014-11-09 | Disposition: A | Discharge status: Home / Self Care | Payer: Self-pay | Source: Ambulatory Visit | Attending: Internal Medicine | Admitting: Internal Medicine

## 2014-11-11 ENCOUNTER — Encounter (HOSPITAL_COMMUNITY)
Admission: RE | Admit: 2014-11-11 | Discharge: 2014-11-11 | Disposition: A | Discharge status: Home / Self Care | Payer: Self-pay | Source: Ambulatory Visit | Attending: Internal Medicine | Admitting: Internal Medicine

## 2014-11-14 DIAGNOSIS — S61451A Open bite of right hand, initial encounter: Secondary | ICD-10-CM | POA: Diagnosis not present

## 2014-11-14 DIAGNOSIS — Y929 Unspecified place or not applicable: Secondary | ICD-10-CM | POA: Diagnosis not present

## 2014-11-14 DIAGNOSIS — Y999 Unspecified external cause status: Secondary | ICD-10-CM | POA: Diagnosis not present

## 2014-11-14 DIAGNOSIS — S61411A Laceration without foreign body of right hand, initial encounter: Secondary | ICD-10-CM | POA: Diagnosis not present

## 2014-11-14 DIAGNOSIS — W540XXA Bitten by dog, initial encounter: Secondary | ICD-10-CM | POA: Diagnosis not present

## 2014-11-14 DIAGNOSIS — Y939 Activity, unspecified: Secondary | ICD-10-CM | POA: Diagnosis not present

## 2014-11-16 ENCOUNTER — Encounter (HOSPITAL_COMMUNITY): Payer: Self-pay

## 2014-11-18 ENCOUNTER — Encounter (HOSPITAL_COMMUNITY): Payer: Self-pay

## 2014-11-23 ENCOUNTER — Encounter (HOSPITAL_COMMUNITY)
Admission: RE | Admit: 2014-11-23 | Discharge: 2014-11-23 | Disposition: A | Discharge status: Home / Self Care | Payer: Self-pay | Source: Ambulatory Visit | Attending: Internal Medicine | Admitting: Internal Medicine

## 2014-11-23 DIAGNOSIS — R05 Cough: Secondary | ICD-10-CM | POA: Insufficient documentation

## 2014-11-23 DIAGNOSIS — E785 Hyperlipidemia, unspecified: Secondary | ICD-10-CM | POA: Insufficient documentation

## 2014-11-23 DIAGNOSIS — J90 Pleural effusion, not elsewhere classified: Secondary | ICD-10-CM | POA: Insufficient documentation

## 2014-11-23 DIAGNOSIS — K219 Gastro-esophageal reflux disease without esophagitis: Secondary | ICD-10-CM | POA: Insufficient documentation

## 2014-11-23 DIAGNOSIS — R0600 Dyspnea, unspecified: Secondary | ICD-10-CM | POA: Insufficient documentation

## 2014-11-23 DIAGNOSIS — J8410 Pulmonary fibrosis, unspecified: Secondary | ICD-10-CM | POA: Insufficient documentation

## 2014-11-23 DIAGNOSIS — Z5189 Encounter for other specified aftercare: Secondary | ICD-10-CM | POA: Insufficient documentation

## 2014-11-23 DIAGNOSIS — I10 Essential (primary) hypertension: Secondary | ICD-10-CM | POA: Insufficient documentation

## 2014-11-30 ENCOUNTER — Encounter (HOSPITAL_COMMUNITY)
Admission: RE | Admit: 2014-11-30 | Discharge: 2014-11-30 | Disposition: A | Discharge status: Home / Self Care | Payer: Self-pay | Source: Ambulatory Visit | Attending: Internal Medicine | Admitting: Internal Medicine

## 2014-12-02 ENCOUNTER — Encounter (HOSPITAL_COMMUNITY)
Admission: RE | Admit: 2014-12-02 | Discharge: 2014-12-02 | Disposition: A | Discharge status: Home / Self Care | Payer: Self-pay | Source: Ambulatory Visit | Attending: Internal Medicine | Admitting: Internal Medicine

## 2014-12-02 DIAGNOSIS — J849 Interstitial pulmonary disease, unspecified: Secondary | ICD-10-CM | POA: Diagnosis not present

## 2014-12-02 DIAGNOSIS — R05 Cough: Secondary | ICD-10-CM | POA: Diagnosis not present

## 2014-12-02 DIAGNOSIS — Z7952 Long term (current) use of systemic steroids: Secondary | ICD-10-CM | POA: Diagnosis not present

## 2014-12-02 DIAGNOSIS — J690 Pneumonitis due to inhalation of food and vomit: Secondary | ICD-10-CM | POA: Diagnosis not present

## 2014-12-02 DIAGNOSIS — Z79899 Other long term (current) drug therapy: Secondary | ICD-10-CM | POA: Diagnosis not present

## 2014-12-02 DIAGNOSIS — T17908A Unspecified foreign body in respiratory tract, part unspecified causing other injury, initial encounter: Secondary | ICD-10-CM | POA: Diagnosis not present

## 2014-12-07 ENCOUNTER — Encounter (HOSPITAL_COMMUNITY)
Admission: RE | Admit: 2014-12-07 | Discharge: 2014-12-07 | Disposition: A | Discharge status: Home / Self Care | Payer: Self-pay | Source: Ambulatory Visit | Attending: Internal Medicine | Admitting: Internal Medicine

## 2014-12-09 ENCOUNTER — Encounter (HOSPITAL_COMMUNITY)
Admission: RE | Admit: 2014-12-09 | Discharge: 2014-12-09 | Disposition: A | Discharge status: Home / Self Care | Payer: Self-pay | Source: Ambulatory Visit | Attending: Internal Medicine | Admitting: Internal Medicine

## 2014-12-14 ENCOUNTER — Encounter (HOSPITAL_COMMUNITY)
Admission: RE | Admit: 2014-12-14 | Discharge: 2014-12-14 | Disposition: A | Discharge status: Home / Self Care | Payer: Self-pay | Source: Ambulatory Visit | Attending: Internal Medicine | Admitting: Internal Medicine

## 2014-12-16 ENCOUNTER — Encounter (HOSPITAL_COMMUNITY): Payer: Self-pay

## 2014-12-21 ENCOUNTER — Encounter (HOSPITAL_COMMUNITY)
Admission: RE | Admit: 2014-12-21 | Discharge: 2014-12-21 | Disposition: A | Discharge status: Home / Self Care | Payer: Self-pay | Source: Ambulatory Visit | Attending: Internal Medicine | Admitting: Internal Medicine

## 2014-12-21 DIAGNOSIS — I10 Essential (primary) hypertension: Secondary | ICD-10-CM | POA: Insufficient documentation

## 2014-12-21 DIAGNOSIS — J90 Pleural effusion, not elsewhere classified: Secondary | ICD-10-CM | POA: Insufficient documentation

## 2014-12-21 DIAGNOSIS — K219 Gastro-esophageal reflux disease without esophagitis: Secondary | ICD-10-CM | POA: Insufficient documentation

## 2014-12-21 DIAGNOSIS — R05 Cough: Secondary | ICD-10-CM | POA: Insufficient documentation

## 2014-12-21 DIAGNOSIS — R0600 Dyspnea, unspecified: Secondary | ICD-10-CM | POA: Insufficient documentation

## 2014-12-21 DIAGNOSIS — J8410 Pulmonary fibrosis, unspecified: Secondary | ICD-10-CM | POA: Insufficient documentation

## 2014-12-21 DIAGNOSIS — Z5189 Encounter for other specified aftercare: Secondary | ICD-10-CM | POA: Insufficient documentation

## 2014-12-21 DIAGNOSIS — E785 Hyperlipidemia, unspecified: Secondary | ICD-10-CM | POA: Insufficient documentation

## 2014-12-22 DIAGNOSIS — T17908A Unspecified foreign body in respiratory tract, part unspecified causing other injury, initial encounter: Secondary | ICD-10-CM | POA: Diagnosis not present

## 2014-12-22 DIAGNOSIS — R1312 Dysphagia, oropharyngeal phase: Secondary | ICD-10-CM | POA: Diagnosis not present

## 2014-12-22 DIAGNOSIS — J690 Pneumonitis due to inhalation of food and vomit: Secondary | ICD-10-CM | POA: Diagnosis not present

## 2014-12-23 ENCOUNTER — Encounter (HOSPITAL_COMMUNITY)
Admission: RE | Admit: 2014-12-23 | Discharge: 2014-12-23 | Disposition: A | Discharge status: Home / Self Care | Payer: Self-pay | Source: Ambulatory Visit | Attending: Internal Medicine | Admitting: Internal Medicine

## 2014-12-28 ENCOUNTER — Encounter (HOSPITAL_COMMUNITY)
Admission: RE | Admit: 2014-12-28 | Discharge: 2014-12-28 | Disposition: A | Discharge status: Home / Self Care | Payer: Self-pay | Source: Ambulatory Visit | Attending: Internal Medicine | Admitting: Internal Medicine

## 2014-12-30 ENCOUNTER — Encounter (HOSPITAL_COMMUNITY)
Admission: RE | Admit: 2014-12-30 | Discharge: 2014-12-30 | Disposition: A | Discharge status: Home / Self Care | Payer: Self-pay | Source: Ambulatory Visit | Attending: Internal Medicine | Admitting: Internal Medicine

## 2015-01-04 ENCOUNTER — Encounter (HOSPITAL_COMMUNITY)
Admission: RE | Admit: 2015-01-04 | Discharge: 2015-01-04 | Disposition: A | Discharge status: Home / Self Care | Payer: Self-pay | Source: Ambulatory Visit | Attending: Internal Medicine | Admitting: Internal Medicine

## 2015-01-06 ENCOUNTER — Encounter (HOSPITAL_COMMUNITY)
Admission: RE | Admit: 2015-01-06 | Discharge: 2015-01-06 | Disposition: A | Discharge status: Home / Self Care | Payer: Self-pay | Source: Ambulatory Visit | Attending: Internal Medicine | Admitting: Internal Medicine

## 2015-01-07 DIAGNOSIS — R05 Cough: Secondary | ICD-10-CM | POA: Diagnosis not present

## 2015-01-07 DIAGNOSIS — K219 Gastro-esophageal reflux disease without esophagitis: Secondary | ICD-10-CM | POA: Diagnosis not present

## 2015-01-07 DIAGNOSIS — Z79899 Other long term (current) drug therapy: Secondary | ICD-10-CM | POA: Diagnosis not present

## 2015-01-07 DIAGNOSIS — J849 Interstitial pulmonary disease, unspecified: Secondary | ICD-10-CM | POA: Diagnosis not present

## 2015-01-11 ENCOUNTER — Encounter (HOSPITAL_COMMUNITY)
Admission: RE | Admit: 2015-01-11 | Discharge: 2015-01-11 | Disposition: A | Discharge status: Home / Self Care | Payer: Self-pay | Source: Ambulatory Visit | Attending: Internal Medicine | Admitting: Internal Medicine

## 2015-01-13 ENCOUNTER — Encounter (HOSPITAL_COMMUNITY)
Admission: RE | Admit: 2015-01-13 | Discharge: 2015-01-13 | Disposition: A | Discharge status: Home / Self Care | Payer: Self-pay | Source: Ambulatory Visit | Attending: Internal Medicine | Admitting: Internal Medicine

## 2015-01-17 DIAGNOSIS — J8410 Pulmonary fibrosis, unspecified: Secondary | ICD-10-CM | POA: Diagnosis not present

## 2015-01-17 DIAGNOSIS — R110 Nausea: Secondary | ICD-10-CM | POA: Diagnosis not present

## 2015-01-17 DIAGNOSIS — R1310 Dysphagia, unspecified: Secondary | ICD-10-CM | POA: Diagnosis not present

## 2015-01-18 ENCOUNTER — Encounter (HOSPITAL_COMMUNITY)
Admission: RE | Admit: 2015-01-18 | Discharge: 2015-01-18 | Disposition: A | Discharge status: Home / Self Care | Payer: Self-pay | Source: Ambulatory Visit | Attending: Internal Medicine | Admitting: Internal Medicine

## 2015-01-18 DIAGNOSIS — H43813 Vitreous degeneration, bilateral: Secondary | ICD-10-CM | POA: Diagnosis not present

## 2015-01-18 DIAGNOSIS — H2513 Age-related nuclear cataract, bilateral: Secondary | ICD-10-CM | POA: Diagnosis not present

## 2015-01-18 DIAGNOSIS — H5203 Hypermetropia, bilateral: Secondary | ICD-10-CM | POA: Diagnosis not present

## 2015-01-18 DIAGNOSIS — H524 Presbyopia: Secondary | ICD-10-CM | POA: Diagnosis not present

## 2015-01-20 ENCOUNTER — Encounter (HOSPITAL_COMMUNITY)
Admission: RE | Admit: 2015-01-20 | Discharge: 2015-01-20 | Disposition: A | Discharge status: Home / Self Care | Payer: Self-pay | Source: Ambulatory Visit | Attending: Internal Medicine | Admitting: Internal Medicine

## 2015-01-20 DIAGNOSIS — J8410 Pulmonary fibrosis, unspecified: Secondary | ICD-10-CM | POA: Insufficient documentation

## 2015-01-20 DIAGNOSIS — I10 Essential (primary) hypertension: Secondary | ICD-10-CM | POA: Insufficient documentation

## 2015-01-20 DIAGNOSIS — K219 Gastro-esophageal reflux disease without esophagitis: Secondary | ICD-10-CM | POA: Insufficient documentation

## 2015-01-20 DIAGNOSIS — R05 Cough: Secondary | ICD-10-CM | POA: Insufficient documentation

## 2015-01-20 DIAGNOSIS — J90 Pleural effusion, not elsewhere classified: Secondary | ICD-10-CM | POA: Insufficient documentation

## 2015-01-20 DIAGNOSIS — E785 Hyperlipidemia, unspecified: Secondary | ICD-10-CM | POA: Insufficient documentation

## 2015-01-20 DIAGNOSIS — Z5189 Encounter for other specified aftercare: Secondary | ICD-10-CM | POA: Insufficient documentation

## 2015-01-20 DIAGNOSIS — R0600 Dyspnea, unspecified: Secondary | ICD-10-CM | POA: Insufficient documentation

## 2015-01-25 ENCOUNTER — Encounter (HOSPITAL_COMMUNITY)
Admission: RE | Admit: 2015-01-25 | Discharge: 2015-01-25 | Disposition: A | Discharge status: Home / Self Care | Payer: Self-pay | Source: Ambulatory Visit | Attending: Internal Medicine | Admitting: Internal Medicine

## 2015-01-27 ENCOUNTER — Encounter (HOSPITAL_COMMUNITY)
Admission: RE | Admit: 2015-01-27 | Discharge: 2015-01-27 | Disposition: A | Discharge status: Home / Self Care | Payer: Self-pay | Source: Ambulatory Visit | Attending: Internal Medicine | Admitting: Internal Medicine

## 2015-02-01 ENCOUNTER — Encounter (HOSPITAL_COMMUNITY): Payer: Self-pay

## 2015-02-03 ENCOUNTER — Encounter (HOSPITAL_COMMUNITY)
Admission: RE | Admit: 2015-02-03 | Discharge: 2015-02-03 | Disposition: A | Discharge status: Home / Self Care | Payer: Self-pay | Source: Ambulatory Visit | Attending: Internal Medicine | Admitting: Internal Medicine

## 2015-02-08 ENCOUNTER — Encounter (HOSPITAL_COMMUNITY): Payer: Self-pay

## 2015-02-10 ENCOUNTER — Encounter (HOSPITAL_COMMUNITY): Payer: Self-pay

## 2015-02-15 ENCOUNTER — Telehealth (HOSPITAL_COMMUNITY): Payer: Self-pay | Admitting: *Deleted

## 2015-02-15 ENCOUNTER — Encounter (HOSPITAL_COMMUNITY): Admission: RE | Admit: 2015-02-15 | Payer: Self-pay | Source: Ambulatory Visit

## 2015-02-15 DIAGNOSIS — R0602 Shortness of breath: Secondary | ICD-10-CM | POA: Diagnosis not present

## 2015-02-15 DIAGNOSIS — E871 Hypo-osmolality and hyponatremia: Secondary | ICD-10-CM | POA: Diagnosis not present

## 2015-02-15 DIAGNOSIS — R05 Cough: Secondary | ICD-10-CM | POA: Diagnosis not present

## 2015-02-15 DIAGNOSIS — Z23 Encounter for immunization: Secondary | ICD-10-CM | POA: Diagnosis not present

## 2015-02-15 DIAGNOSIS — J84112 Idiopathic pulmonary fibrosis: Secondary | ICD-10-CM | POA: Diagnosis not present

## 2015-02-15 DIAGNOSIS — J849 Interstitial pulmonary disease, unspecified: Secondary | ICD-10-CM | POA: Diagnosis not present

## 2015-02-15 DIAGNOSIS — R0902 Hypoxemia: Secondary | ICD-10-CM | POA: Diagnosis not present

## 2015-02-16 DIAGNOSIS — E871 Hypo-osmolality and hyponatremia: Secondary | ICD-10-CM | POA: Diagnosis not present

## 2015-02-17 ENCOUNTER — Encounter (HOSPITAL_COMMUNITY)
Admission: RE | Admit: 2015-02-17 | Discharge: 2015-02-17 | Disposition: A | Discharge status: Home / Self Care | Payer: Self-pay | Source: Ambulatory Visit | Attending: Internal Medicine | Admitting: Internal Medicine

## 2015-02-18 DIAGNOSIS — Z8551 Personal history of malignant neoplasm of bladder: Secondary | ICD-10-CM | POA: Diagnosis not present

## 2015-02-18 DIAGNOSIS — N138 Other obstructive and reflux uropathy: Secondary | ICD-10-CM | POA: Diagnosis not present

## 2015-02-18 DIAGNOSIS — R3912 Poor urinary stream: Secondary | ICD-10-CM | POA: Diagnosis not present

## 2015-02-18 DIAGNOSIS — N401 Enlarged prostate with lower urinary tract symptoms: Secondary | ICD-10-CM | POA: Diagnosis not present

## 2015-02-22 ENCOUNTER — Encounter (HOSPITAL_COMMUNITY)
Admission: RE | Admit: 2015-02-22 | Discharge: 2015-02-22 | Disposition: A | Discharge status: Home / Self Care | Payer: Self-pay | Source: Ambulatory Visit | Attending: Internal Medicine | Admitting: Internal Medicine

## 2015-02-22 DIAGNOSIS — R0600 Dyspnea, unspecified: Secondary | ICD-10-CM | POA: Insufficient documentation

## 2015-02-22 DIAGNOSIS — I10 Essential (primary) hypertension: Secondary | ICD-10-CM | POA: Insufficient documentation

## 2015-02-22 DIAGNOSIS — Z5189 Encounter for other specified aftercare: Secondary | ICD-10-CM | POA: Insufficient documentation

## 2015-02-22 DIAGNOSIS — J8410 Pulmonary fibrosis, unspecified: Secondary | ICD-10-CM | POA: Insufficient documentation

## 2015-02-22 DIAGNOSIS — E785 Hyperlipidemia, unspecified: Secondary | ICD-10-CM | POA: Insufficient documentation

## 2015-02-22 DIAGNOSIS — K219 Gastro-esophageal reflux disease without esophagitis: Secondary | ICD-10-CM | POA: Insufficient documentation

## 2015-02-22 DIAGNOSIS — J90 Pleural effusion, not elsewhere classified: Secondary | ICD-10-CM | POA: Insufficient documentation

## 2015-02-22 DIAGNOSIS — R05 Cough: Secondary | ICD-10-CM | POA: Insufficient documentation

## 2015-02-24 ENCOUNTER — Encounter (HOSPITAL_COMMUNITY)
Admission: RE | Admit: 2015-02-24 | Discharge: 2015-02-24 | Disposition: A | Discharge status: Home / Self Care | Payer: Self-pay | Source: Ambulatory Visit | Attending: Internal Medicine | Admitting: Internal Medicine

## 2015-03-01 ENCOUNTER — Encounter (HOSPITAL_COMMUNITY)
Admission: RE | Admit: 2015-03-01 | Discharge: 2015-03-01 | Disposition: A | Discharge status: Home / Self Care | Payer: Self-pay | Source: Ambulatory Visit | Attending: Internal Medicine | Admitting: Internal Medicine

## 2015-03-03 ENCOUNTER — Encounter (HOSPITAL_COMMUNITY): Payer: Self-pay

## 2015-03-08 ENCOUNTER — Encounter (HOSPITAL_COMMUNITY)
Admission: RE | Admit: 2015-03-08 | Discharge: 2015-03-08 | Disposition: A | Discharge status: Home / Self Care | Payer: Self-pay | Source: Ambulatory Visit | Attending: Internal Medicine | Admitting: Internal Medicine

## 2015-03-10 ENCOUNTER — Encounter (HOSPITAL_COMMUNITY): Payer: Self-pay

## 2015-03-15 ENCOUNTER — Encounter (HOSPITAL_COMMUNITY): Payer: Self-pay

## 2015-03-17 ENCOUNTER — Encounter (HOSPITAL_COMMUNITY)
Admission: RE | Admit: 2015-03-17 | Discharge: 2015-03-17 | Disposition: A | Discharge status: Home / Self Care | Payer: Self-pay | Source: Ambulatory Visit | Attending: Internal Medicine | Admitting: Internal Medicine

## 2015-03-21 DIAGNOSIS — J8410 Pulmonary fibrosis, unspecified: Secondary | ICD-10-CM | POA: Diagnosis not present

## 2015-03-21 DIAGNOSIS — J069 Acute upper respiratory infection, unspecified: Secondary | ICD-10-CM | POA: Diagnosis not present

## 2015-03-22 ENCOUNTER — Encounter (HOSPITAL_COMMUNITY): Payer: Federal, State, Local not specified - PPO

## 2015-03-22 DIAGNOSIS — R05 Cough: Secondary | ICD-10-CM | POA: Insufficient documentation

## 2015-03-22 DIAGNOSIS — R0600 Dyspnea, unspecified: Secondary | ICD-10-CM | POA: Insufficient documentation

## 2015-03-22 DIAGNOSIS — J8410 Pulmonary fibrosis, unspecified: Secondary | ICD-10-CM | POA: Insufficient documentation

## 2015-03-22 DIAGNOSIS — K219 Gastro-esophageal reflux disease without esophagitis: Secondary | ICD-10-CM | POA: Insufficient documentation

## 2015-03-22 DIAGNOSIS — J90 Pleural effusion, not elsewhere classified: Secondary | ICD-10-CM | POA: Insufficient documentation

## 2015-03-22 DIAGNOSIS — Z5189 Encounter for other specified aftercare: Secondary | ICD-10-CM | POA: Insufficient documentation

## 2015-03-22 DIAGNOSIS — I10 Essential (primary) hypertension: Secondary | ICD-10-CM | POA: Insufficient documentation

## 2015-03-22 DIAGNOSIS — E785 Hyperlipidemia, unspecified: Secondary | ICD-10-CM | POA: Insufficient documentation

## 2015-03-24 ENCOUNTER — Encounter (HOSPITAL_COMMUNITY): Payer: Self-pay

## 2015-03-29 ENCOUNTER — Encounter (HOSPITAL_COMMUNITY)
Admission: RE | Admit: 2015-03-29 | Discharge: 2015-03-29 | Disposition: A | Discharge status: Home / Self Care | Payer: Self-pay | Source: Ambulatory Visit | Attending: Internal Medicine | Admitting: Internal Medicine

## 2015-03-31 ENCOUNTER — Encounter (HOSPITAL_COMMUNITY)
Admission: RE | Admit: 2015-03-31 | Discharge: 2015-03-31 | Disposition: A | Discharge status: Home / Self Care | Payer: Self-pay | Source: Ambulatory Visit | Attending: Internal Medicine | Admitting: Internal Medicine

## 2015-04-05 ENCOUNTER — Encounter (HOSPITAL_COMMUNITY)
Admission: RE | Admit: 2015-04-05 | Discharge: 2015-04-05 | Disposition: A | Discharge status: Home / Self Care | Payer: Self-pay | Source: Ambulatory Visit | Attending: Internal Medicine | Admitting: Internal Medicine

## 2015-04-07 ENCOUNTER — Encounter (HOSPITAL_COMMUNITY)
Admission: RE | Admit: 2015-04-07 | Discharge: 2015-04-07 | Disposition: A | Discharge status: Home / Self Care | Payer: Self-pay | Source: Ambulatory Visit | Attending: Internal Medicine | Admitting: Internal Medicine

## 2015-04-12 ENCOUNTER — Encounter (HOSPITAL_COMMUNITY)
Admission: RE | Admit: 2015-04-12 | Discharge: 2015-04-12 | Disposition: A | Discharge status: Home / Self Care | Payer: Self-pay | Source: Ambulatory Visit | Attending: Internal Medicine | Admitting: Internal Medicine

## 2015-04-14 ENCOUNTER — Encounter (HOSPITAL_COMMUNITY): Payer: Self-pay

## 2015-04-19 ENCOUNTER — Encounter (HOSPITAL_COMMUNITY)
Admission: RE | Admit: 2015-04-19 | Discharge: 2015-04-19 | Disposition: A | Discharge status: Home / Self Care | Payer: Self-pay | Source: Ambulatory Visit | Attending: Internal Medicine | Admitting: Internal Medicine

## 2015-04-21 ENCOUNTER — Encounter (HOSPITAL_COMMUNITY)
Admission: RE | Admit: 2015-04-21 | Discharge: 2015-04-21 | Disposition: A | Discharge status: Home / Self Care | Payer: Self-pay | Source: Ambulatory Visit | Attending: Internal Medicine | Admitting: Internal Medicine

## 2015-04-21 DIAGNOSIS — R05 Cough: Secondary | ICD-10-CM | POA: Insufficient documentation

## 2015-04-21 DIAGNOSIS — R0600 Dyspnea, unspecified: Secondary | ICD-10-CM | POA: Insufficient documentation

## 2015-04-21 DIAGNOSIS — E785 Hyperlipidemia, unspecified: Secondary | ICD-10-CM | POA: Insufficient documentation

## 2015-04-21 DIAGNOSIS — K219 Gastro-esophageal reflux disease without esophagitis: Secondary | ICD-10-CM | POA: Insufficient documentation

## 2015-04-21 DIAGNOSIS — I10 Essential (primary) hypertension: Secondary | ICD-10-CM | POA: Insufficient documentation

## 2015-04-21 DIAGNOSIS — Z5189 Encounter for other specified aftercare: Secondary | ICD-10-CM | POA: Insufficient documentation

## 2015-04-21 DIAGNOSIS — J90 Pleural effusion, not elsewhere classified: Secondary | ICD-10-CM | POA: Insufficient documentation

## 2015-04-21 DIAGNOSIS — J8410 Pulmonary fibrosis, unspecified: Secondary | ICD-10-CM | POA: Insufficient documentation

## 2015-04-26 ENCOUNTER — Encounter (HOSPITAL_COMMUNITY): Payer: Self-pay

## 2015-04-28 ENCOUNTER — Encounter (HOSPITAL_COMMUNITY)
Admission: RE | Admit: 2015-04-28 | Discharge: 2015-04-28 | Disposition: A | Discharge status: Home / Self Care | Payer: Self-pay | Source: Ambulatory Visit | Attending: Internal Medicine | Admitting: Internal Medicine

## 2015-05-03 ENCOUNTER — Encounter (HOSPITAL_COMMUNITY)
Admission: RE | Admit: 2015-05-03 | Discharge: 2015-05-03 | Disposition: A | Discharge status: Home / Self Care | Payer: Self-pay | Source: Ambulatory Visit | Attending: Internal Medicine | Admitting: Internal Medicine

## 2015-05-05 ENCOUNTER — Encounter (HOSPITAL_COMMUNITY)
Admission: RE | Admit: 2015-05-05 | Discharge: 2015-05-05 | Disposition: A | Discharge status: Home / Self Care | Payer: Self-pay | Source: Ambulatory Visit | Attending: Internal Medicine | Admitting: Internal Medicine

## 2015-05-10 ENCOUNTER — Encounter (HOSPITAL_COMMUNITY): Payer: Self-pay

## 2015-05-10 DIAGNOSIS — R0602 Shortness of breath: Secondary | ICD-10-CM | POA: Diagnosis not present

## 2015-05-10 DIAGNOSIS — J9611 Chronic respiratory failure with hypoxia: Secondary | ICD-10-CM | POA: Diagnosis not present

## 2015-05-10 DIAGNOSIS — R05 Cough: Secondary | ICD-10-CM | POA: Diagnosis not present

## 2015-05-10 DIAGNOSIS — J849 Interstitial pulmonary disease, unspecified: Secondary | ICD-10-CM | POA: Diagnosis not present

## 2015-05-10 DIAGNOSIS — K219 Gastro-esophageal reflux disease without esophagitis: Secondary | ICD-10-CM | POA: Diagnosis not present

## 2015-05-10 DIAGNOSIS — J84112 Idiopathic pulmonary fibrosis: Secondary | ICD-10-CM | POA: Diagnosis not present

## 2015-05-12 ENCOUNTER — Encounter (HOSPITAL_COMMUNITY)
Admission: RE | Admit: 2015-05-12 | Discharge: 2015-05-12 | Disposition: A | Discharge status: Home / Self Care | Payer: Self-pay | Source: Ambulatory Visit | Attending: Internal Medicine | Admitting: Internal Medicine

## 2015-05-17 ENCOUNTER — Encounter (HOSPITAL_COMMUNITY)
Admission: RE | Admit: 2015-05-17 | Discharge: 2015-05-17 | Disposition: A | Discharge status: Home / Self Care | Payer: Self-pay | Source: Ambulatory Visit | Attending: Internal Medicine | Admitting: Internal Medicine

## 2015-05-18 DIAGNOSIS — N529 Male erectile dysfunction, unspecified: Secondary | ICD-10-CM | POA: Diagnosis not present

## 2015-05-18 DIAGNOSIS — N183 Chronic kidney disease, stage 3 (moderate): Secondary | ICD-10-CM | POA: Diagnosis not present

## 2015-05-18 DIAGNOSIS — Z0001 Encounter for general adult medical examination with abnormal findings: Secondary | ICD-10-CM | POA: Diagnosis not present

## 2015-05-18 DIAGNOSIS — Z79899 Other long term (current) drug therapy: Secondary | ICD-10-CM | POA: Diagnosis not present

## 2015-05-18 DIAGNOSIS — J8410 Pulmonary fibrosis, unspecified: Secondary | ICD-10-CM | POA: Diagnosis not present

## 2015-05-18 DIAGNOSIS — E785 Hyperlipidemia, unspecified: Secondary | ICD-10-CM | POA: Diagnosis not present

## 2015-05-18 DIAGNOSIS — Z8551 Personal history of malignant neoplasm of bladder: Secondary | ICD-10-CM | POA: Diagnosis not present

## 2015-05-18 DIAGNOSIS — I10 Essential (primary) hypertension: Secondary | ICD-10-CM | POA: Diagnosis not present

## 2015-05-19 ENCOUNTER — Encounter (HOSPITAL_COMMUNITY)
Admission: RE | Admit: 2015-05-19 | Discharge: 2015-05-19 | Disposition: A | Discharge status: Home / Self Care | Payer: Self-pay | Source: Ambulatory Visit | Attending: Internal Medicine | Admitting: Internal Medicine

## 2015-05-24 ENCOUNTER — Encounter (HOSPITAL_COMMUNITY): Payer: Federal, State, Local not specified - PPO

## 2015-05-24 DIAGNOSIS — R05 Cough: Secondary | ICD-10-CM | POA: Insufficient documentation

## 2015-05-24 DIAGNOSIS — J90 Pleural effusion, not elsewhere classified: Secondary | ICD-10-CM | POA: Insufficient documentation

## 2015-05-24 DIAGNOSIS — K219 Gastro-esophageal reflux disease without esophagitis: Secondary | ICD-10-CM | POA: Insufficient documentation

## 2015-05-24 DIAGNOSIS — Z5189 Encounter for other specified aftercare: Secondary | ICD-10-CM | POA: Insufficient documentation

## 2015-05-24 DIAGNOSIS — E785 Hyperlipidemia, unspecified: Secondary | ICD-10-CM | POA: Insufficient documentation

## 2015-05-24 DIAGNOSIS — J8410 Pulmonary fibrosis, unspecified: Secondary | ICD-10-CM | POA: Insufficient documentation

## 2015-05-24 DIAGNOSIS — R0600 Dyspnea, unspecified: Secondary | ICD-10-CM | POA: Insufficient documentation

## 2015-05-24 DIAGNOSIS — I10 Essential (primary) hypertension: Secondary | ICD-10-CM | POA: Insufficient documentation

## 2015-05-26 ENCOUNTER — Encounter (HOSPITAL_COMMUNITY)
Admission: RE | Admit: 2015-05-26 | Discharge: 2015-05-26 | Disposition: A | Discharge status: Home / Self Care | Payer: Self-pay | Source: Ambulatory Visit | Attending: Internal Medicine | Admitting: Internal Medicine

## 2015-05-31 ENCOUNTER — Encounter (HOSPITAL_COMMUNITY): Payer: Self-pay

## 2015-05-31 DIAGNOSIS — J8410 Pulmonary fibrosis, unspecified: Secondary | ICD-10-CM | POA: Diagnosis not present

## 2015-05-31 DIAGNOSIS — J069 Acute upper respiratory infection, unspecified: Secondary | ICD-10-CM | POA: Diagnosis not present

## 2015-06-02 ENCOUNTER — Encounter (HOSPITAL_COMMUNITY): Payer: Self-pay

## 2015-06-07 ENCOUNTER — Encounter (HOSPITAL_COMMUNITY)
Admission: RE | Admit: 2015-06-07 | Discharge: 2015-06-07 | Disposition: A | Discharge status: Home / Self Care | Payer: Self-pay | Source: Ambulatory Visit | Attending: Internal Medicine | Admitting: Internal Medicine

## 2015-06-09 ENCOUNTER — Encounter (HOSPITAL_COMMUNITY): Payer: Self-pay

## 2015-06-14 ENCOUNTER — Encounter (HOSPITAL_COMMUNITY)
Admission: RE | Admit: 2015-06-14 | Discharge: 2015-06-14 | Disposition: A | Discharge status: Home / Self Care | Payer: Self-pay | Source: Ambulatory Visit | Attending: Internal Medicine | Admitting: Internal Medicine

## 2015-06-16 ENCOUNTER — Encounter (HOSPITAL_COMMUNITY)
Admission: RE | Admit: 2015-06-16 | Discharge: 2015-06-16 | Disposition: A | Discharge status: Home / Self Care | Payer: Self-pay | Source: Ambulatory Visit | Attending: Internal Medicine | Admitting: Internal Medicine

## 2015-06-21 ENCOUNTER — Encounter (HOSPITAL_COMMUNITY)
Admission: RE | Admit: 2015-06-21 | Discharge: 2015-06-21 | Disposition: A | Discharge status: Home / Self Care | Payer: Self-pay | Source: Ambulatory Visit | Attending: Internal Medicine | Admitting: Internal Medicine

## 2015-06-23 ENCOUNTER — Encounter (HOSPITAL_COMMUNITY)
Admission: RE | Admit: 2015-06-23 | Discharge: 2015-06-23 | Disposition: A | Discharge status: Home / Self Care | Payer: Self-pay | Source: Ambulatory Visit | Attending: Internal Medicine | Admitting: Internal Medicine

## 2015-06-23 DIAGNOSIS — I10 Essential (primary) hypertension: Secondary | ICD-10-CM | POA: Insufficient documentation

## 2015-06-23 DIAGNOSIS — J90 Pleural effusion, not elsewhere classified: Secondary | ICD-10-CM | POA: Insufficient documentation

## 2015-06-23 DIAGNOSIS — K219 Gastro-esophageal reflux disease without esophagitis: Secondary | ICD-10-CM | POA: Insufficient documentation

## 2015-06-23 DIAGNOSIS — Z5189 Encounter for other specified aftercare: Secondary | ICD-10-CM | POA: Insufficient documentation

## 2015-06-23 DIAGNOSIS — J8410 Pulmonary fibrosis, unspecified: Secondary | ICD-10-CM | POA: Insufficient documentation

## 2015-06-23 DIAGNOSIS — R0600 Dyspnea, unspecified: Secondary | ICD-10-CM | POA: Insufficient documentation

## 2015-06-23 DIAGNOSIS — R05 Cough: Secondary | ICD-10-CM | POA: Insufficient documentation

## 2015-06-23 DIAGNOSIS — E785 Hyperlipidemia, unspecified: Secondary | ICD-10-CM | POA: Insufficient documentation

## 2015-06-28 ENCOUNTER — Encounter (HOSPITAL_COMMUNITY)
Admission: RE | Admit: 2015-06-28 | Discharge: 2015-06-28 | Disposition: A | Discharge status: Home / Self Care | Payer: Self-pay | Source: Ambulatory Visit | Attending: Internal Medicine | Admitting: Internal Medicine

## 2015-06-30 ENCOUNTER — Encounter (HOSPITAL_COMMUNITY)
Admission: RE | Admit: 2015-06-30 | Discharge: 2015-06-30 | Disposition: A | Discharge status: Home / Self Care | Payer: Self-pay | Source: Ambulatory Visit | Attending: Internal Medicine | Admitting: Internal Medicine

## 2015-07-07 ENCOUNTER — Encounter (HOSPITAL_COMMUNITY)
Admission: RE | Admit: 2015-07-07 | Discharge: 2015-07-07 | Disposition: A | Discharge status: Home / Self Care | Payer: Self-pay | Source: Ambulatory Visit | Attending: Internal Medicine | Admitting: Internal Medicine

## 2015-07-12 ENCOUNTER — Encounter (HOSPITAL_COMMUNITY): Admission: RE | Admit: 2015-07-12 | Payer: Self-pay | Source: Ambulatory Visit

## 2015-07-14 ENCOUNTER — Encounter (HOSPITAL_COMMUNITY)
Admission: RE | Admit: 2015-07-14 | Discharge: 2015-07-14 | Disposition: A | Discharge status: Home / Self Care | Payer: Self-pay | Source: Ambulatory Visit | Attending: Internal Medicine | Admitting: Internal Medicine

## 2015-07-19 ENCOUNTER — Encounter (HOSPITAL_COMMUNITY)
Admission: RE | Admit: 2015-07-19 | Discharge: 2015-07-19 | Disposition: A | Discharge status: Home / Self Care | Payer: Self-pay | Source: Ambulatory Visit | Attending: Internal Medicine | Admitting: Internal Medicine

## 2015-07-21 ENCOUNTER — Encounter (HOSPITAL_COMMUNITY)
Admission: RE | Admit: 2015-07-21 | Discharge: 2015-07-21 | Disposition: A | Discharge status: Home / Self Care | Payer: Self-pay | Source: Ambulatory Visit | Attending: Internal Medicine | Admitting: Internal Medicine

## 2015-07-21 DIAGNOSIS — Z5189 Encounter for other specified aftercare: Secondary | ICD-10-CM | POA: Insufficient documentation

## 2015-07-21 DIAGNOSIS — J8410 Pulmonary fibrosis, unspecified: Secondary | ICD-10-CM | POA: Insufficient documentation

## 2015-07-21 DIAGNOSIS — J90 Pleural effusion, not elsewhere classified: Secondary | ICD-10-CM | POA: Insufficient documentation

## 2015-07-21 DIAGNOSIS — K219 Gastro-esophageal reflux disease without esophagitis: Secondary | ICD-10-CM | POA: Insufficient documentation

## 2015-07-21 DIAGNOSIS — E785 Hyperlipidemia, unspecified: Secondary | ICD-10-CM | POA: Insufficient documentation

## 2015-07-21 DIAGNOSIS — R0600 Dyspnea, unspecified: Secondary | ICD-10-CM | POA: Insufficient documentation

## 2015-07-21 DIAGNOSIS — I10 Essential (primary) hypertension: Secondary | ICD-10-CM | POA: Insufficient documentation

## 2015-07-21 DIAGNOSIS — R05 Cough: Secondary | ICD-10-CM | POA: Insufficient documentation

## 2015-07-26 ENCOUNTER — Encounter (HOSPITAL_COMMUNITY)
Admission: RE | Admit: 2015-07-26 | Discharge: 2015-07-26 | Disposition: A | Discharge status: Home / Self Care | Payer: Self-pay | Source: Ambulatory Visit | Attending: Internal Medicine | Admitting: Internal Medicine

## 2015-07-28 ENCOUNTER — Telehealth: Payer: Self-pay | Admitting: Internal Medicine

## 2015-07-28 ENCOUNTER — Encounter (HOSPITAL_COMMUNITY): Payer: Self-pay

## 2015-07-28 ENCOUNTER — Ambulatory Visit (INDEPENDENT_AMBULATORY_CARE_PROVIDER_SITE_OTHER): Payer: Medicare Other | Admitting: Adult Health

## 2015-07-28 ENCOUNTER — Encounter: Payer: Self-pay | Admitting: Adult Health

## 2015-07-28 ENCOUNTER — Ambulatory Visit (INDEPENDENT_AMBULATORY_CARE_PROVIDER_SITE_OTHER)
Admission: RE | Admit: 2015-07-28 | Discharge: 2015-07-28 | Disposition: A | Discharge status: Home / Self Care | Payer: Medicare Other | Source: Ambulatory Visit | Attending: Adult Health | Admitting: Adult Health

## 2015-07-28 VITALS — BP 136/70 | HR 74 | Temp 98.9°F | Ht 67.0 in | Wt 163.0 lb

## 2015-07-28 DIAGNOSIS — J208 Acute bronchitis due to other specified organisms: Secondary | ICD-10-CM

## 2015-07-28 DIAGNOSIS — R05 Cough: Secondary | ICD-10-CM | POA: Diagnosis not present

## 2015-07-28 DIAGNOSIS — R0602 Shortness of breath: Secondary | ICD-10-CM | POA: Diagnosis not present

## 2015-07-28 MED ORDER — PREDNISONE 10 MG PO TABS: ORAL_TABLET | ORAL | Status: DC

## 2015-07-28 MED ORDER — AMOXICILLIN-POT CLAVULANATE 875-125 MG PO TABS: 1.0000 | ORAL_TABLET | Freq: Two times a day (BID) | ORAL | Status: AC

## 2015-07-28 NOTE — Progress Notes (Signed)
Subjective:    Patient ID: [name] [name], male    DOB: June 16, 1934, 81 y.o.   MRN: [id number]  HPI 81 yo male with IPF  He is [facility] by Dr. [name]  07/28/2015 Acute OV  Pt presents for an acute office visit . Complain of prod cough with clear mucus, chest congestion, sinus drainage/congestion, and wheezing at times starting 07/26/15.  Denies any chest tightness, fever, nausea or vomiting. Appetite is down . Drinking ensure. .  On Esbriet Three times a day  . Follows at [facility] .  In pulmonary rehab, was there 2 days ago. Not able to go today .  Not seen by Dr. Annamaria Boots  Since 2015 . Previous in IPF study .      Past Medical History  Diagnosis Date  . Lung nodule   . IPF (idiopathic pulmonary fibrosis) ([place])   . Unspecified essential hypertension   . Other and unspecified hyperlipidemia   . Esophageal reflux   . Bradycardia   . Fluttering heart ([place])   . Palpitations   . Cancer ([place])     bladder  . Pulmonary fibrosis ([place])   . Hx of bladder cancer 08/26/2014    Claudia Desanctis, CCRP spoke with patient wife in the presence of PI, Dr. Chase Caller.  AFFERENT cough IPF study:  Wife narrated, "bladder Cancer in 2010...states, had a small spot removed, no additional treatment required and is in complete remission"    Current Outpatient Prescriptions on File Prior to Visit  Medication Sig Dispense Refill  . ALPRAZolam (XANAX) 0.25 MG tablet Take 1 tablet (0.25 mg total) by mouth daily as needed for anxiety. 10 tablet 0  . aspirin 325 MG tablet Take 162.5 mg by mouth daily.    [name] atenolol (TENORMIN) 25 MG tablet Take 25 mg by mouth daily.    [name] atorvastatin (LIPITOR) 80 MG tablet Take 80 mg by mouth daily.     [name] KLOR-CON M10 10 MEQ tablet Take 1 tablet by mouth daily.    . metoCLOPramide (REGLAN) 10 MG tablet Take 15 mg by mouth daily as needed.     . Multiple Vitamin (MULITIVITAMIN WITH MINERALS) TABS Take 1 tablet by mouth daily.    [name] omeprazole (PRILOSEC) 20 MG capsule Take 20  mg by mouth daily. 30 minutes before breakfast    . Pirfenidone 267 MG CAPS Take 3 tablets by mouth 3 (three) times daily.    . tamsulosin (FLOMAX) 0.4 MG CAPS capsule Take 0.4 mg by mouth daily.    [name] telmisartan-hydrochlorothiazide (MICARDIS HCT) 80-25 MG per tablet Take 1 tablet by mouth daily.    . clotrimazole-betamethasone (LOTRISONE) cream Reported on 07/28/2015     No current facility-administered medications on file prior to visit.      Review of Systems Constitutional:   No  weight loss, night sweats,  Fevers,  +chills, fatigue, or  lassitude.  HEENT:   No headaches,  Difficulty swallowing,  Tooth/dental problems, or  Sore throat,                No sneezing, itching, ear ache, nasal congestion, post nasal drip,   CV:  No chest pain,  Orthopnea, PND, swelling in lower extremities, anasarca, dizziness, palpitations, syncope.   GI  No heartburn, indigestion, abdominal pain, nausea, vomiting, diarrhea, change in bowel habits, loss of appetite, bloody stools.   Resp:    No chest wall deformity  Skin: no rash or lesions.  GU: no dysuria, change in color  of urine, no urgency or frequency.  No flank pain, no hematuria   MS:  No joint pain or swelling.  No decreased range of motion.  No back pain.  Psych:  No change in mood or affect. No depression or anxiety.  No memory loss.         Objective:   Physical Exam Filed Vitals:   07/28/15 1452  BP: 136/70  Pulse: 74  Temp: 98.9 F (37.2 C)  TempSrc: Oral  Height: 5\' 7"  (1.702 m)  Weight: 163 lb (73.936 kg)  SpO2: 95%   GEN: A/Ox3; pleasant , NAD, frail and elderly in wc   HEENT:  [place]/AT,  EACs-clear, TMs-wnl, NOSE-clear, THROAT-clear, no lesions, no postnasal drip or exudate noted.   NECK:  Supple w/ fair ROM; no JVD; normal carotid impulses w/o bruits; no thyromegaly or nodules palpated; no lymphadenopathy.  RESP  BB crackles no accessory muscle use, no dullness to percussion  CARD:  RRR, no m/r/g  , no peripheral  edema, pulses intact, no cyanosis or clubbing.  GI:   Soft & nt; nml bowel sounds; no organomegaly or masses detected.  Musco: Warm bil, no deformities or joint swelling noted.   Neuro: alert, no focal deficits noted.    Skin: Warm, no lesions or rashes   Jawad Wiacek NP-C  [place] [facility]  07/28/2015      Assessment & Plan:

## 2015-07-28 NOTE — Patient Instructions (Signed)
Augmentin 875mg  Twice daily  For 7 days  Mucinex DM Twice daily   Prednisone taper over next week.  Push fluids  Chest xray today .  Please contact office for sooner follow up if symptoms do not improve or worsen or seek emergency care  Follow up with Dr. Annamaria Boots in 2 months and As needed

## 2015-07-28 NOTE — Telephone Encounter (Signed)
Recommend NP

## 2015-07-28 NOTE — Assessment & Plan Note (Signed)
Flare with underlying IPF  Check cxr today   Plan  Augmentin 875mg  Twice daily  For 7 days  Mucinex DM Twice daily   Prednisone taper over next week.  Push fluids  Chest xray today .  Please contact office for sooner follow up if symptoms do not improve or worsen or seek emergency care  Follow up with Dr. Annamaria Boots in 2 months and As needed

## 2015-07-28 NOTE — Telephone Encounter (Signed)
Pt c/o cough and congestion with clear mucus for the past few days. Pt denies any fever- has not taken temp. Pt states that cough is worse at night and he is very wheezy until he coughs up the mucus in his airway.  Pt uses O2 24/7 at 4L Last seen by CY 02/2014 - referred out to [facility] - advised to follow up PRN Last seen by MR 06/2014 for IPF Pt is requesting an OV today to be seen.  Please advise Dr Annamaria Boots if able to see patient today or if need to see NP? Thanks.     Medication List       This list is accurate as of: 07/28/15  9:25 AM.  Always use your most recent med list.               ALPRAZolam 0.25 MG tablet  Commonly known as:  XANAX  Take 1 tablet (0.25 mg total) by mouth daily as needed for anxiety.     aspirin 325 MG tablet  Take 162.5 mg by mouth daily.     atenolol 25 MG tablet  Commonly known as:  TENORMIN  Take 25 mg by mouth daily.     atorvastatin 80 MG tablet  Commonly known as:  LIPITOR  Take 80 mg by mouth daily.     clotrimazole-betamethasone cream  Commonly known as:  LOTRISONE  Apply as directed     KLOR-CON M10 10 MEQ tablet  Generic drug:  potassium chloride  Take 1 tablet by mouth daily.     metoCLOPramide 10 MG tablet  Commonly known as:  REGLAN  Take 15 mg by mouth daily as needed.     multivitamin with minerals Tabs tablet  Take 1 tablet by mouth daily.     omeprazole 20 MG capsule  Commonly known as:  PRILOSEC  Take 20 mg by mouth daily. 30 minutes before breakfast     Pirfenidone 267 MG Caps  Take 3 tablets by mouth 3 (three) times daily.     tamsulosin 0.4 MG Caps capsule  Commonly known as:  FLOMAX  Take 0.4 mg by mouth daily.     telmisartan-hydrochlorothiazide 80-25 MG tablet  Commonly known as:  MICARDIS HCT  Take 1 tablet by mouth daily.       No Known Allergies

## 2015-07-28 NOTE — Telephone Encounter (Signed)
Spoke with pt's wife and offered 2:45pm appt with TP. She said that was fine. Ok per [facility].

## 2015-07-28 NOTE — Telephone Encounter (Signed)
Spoke with patient and wife-appt has been made with CY on Friday 10-14-15 at 11:15am. [name] [name]-pt's wife went back through the AVS from today's visit to ensure she understood instructions. [name] [name] and patient are both aware that CXR results have not came back at this time but TP will inform us of results once she gets them. Nothing more needed at this time.

## 2015-07-28 NOTE — Addendum Note (Signed)
Addended by: Osa Craver on: 07/28/2015 03:32 PM   Modules accepted: Orders

## 2015-07-29 NOTE — Progress Notes (Signed)
Quick Note:  Called and spoke with pt. Reviewed results and recs. Pt voiced understanding and had no further questions. ______ 

## 2015-08-02 ENCOUNTER — Encounter (HOSPITAL_COMMUNITY): Payer: Self-pay

## 2015-08-04 ENCOUNTER — Encounter (HOSPITAL_COMMUNITY): Payer: Self-pay

## 2015-08-09 ENCOUNTER — Encounter (HOSPITAL_COMMUNITY)
Admission: RE | Admit: 2015-08-09 | Discharge: 2015-08-09 | Disposition: A | Discharge status: Home / Self Care | Payer: Self-pay | Source: Ambulatory Visit | Attending: Internal Medicine | Admitting: Internal Medicine

## 2015-08-11 ENCOUNTER — Encounter (HOSPITAL_COMMUNITY)
Admission: RE | Admit: 2015-08-11 | Discharge: 2015-08-11 | Disposition: A | Discharge status: Home / Self Care | Payer: Self-pay | Source: Ambulatory Visit | Attending: Internal Medicine | Admitting: Internal Medicine

## 2015-08-16 ENCOUNTER — Encounter (HOSPITAL_COMMUNITY)
Admission: RE | Admit: 2015-08-16 | Discharge: 2015-08-16 | Disposition: A | Discharge status: Home / Self Care | Payer: Self-pay | Source: Ambulatory Visit | Attending: Internal Medicine | Admitting: Internal Medicine

## 2015-08-18 ENCOUNTER — Encounter (HOSPITAL_COMMUNITY)
Admission: RE | Admit: 2015-08-18 | Discharge: 2015-08-18 | Disposition: A | Discharge status: Home / Self Care | Payer: Self-pay | Source: Ambulatory Visit | Attending: Internal Medicine | Admitting: Internal Medicine

## 2015-08-23 ENCOUNTER — Encounter (HOSPITAL_COMMUNITY): Payer: Self-pay

## 2015-08-23 DIAGNOSIS — Z5189 Encounter for other specified aftercare: Secondary | ICD-10-CM | POA: Insufficient documentation

## 2015-08-23 DIAGNOSIS — K219 Gastro-esophageal reflux disease without esophagitis: Secondary | ICD-10-CM | POA: Insufficient documentation

## 2015-08-23 DIAGNOSIS — I10 Essential (primary) hypertension: Secondary | ICD-10-CM | POA: Insufficient documentation

## 2015-08-23 DIAGNOSIS — J8410 Pulmonary fibrosis, unspecified: Secondary | ICD-10-CM | POA: Insufficient documentation

## 2015-08-23 DIAGNOSIS — E785 Hyperlipidemia, unspecified: Secondary | ICD-10-CM | POA: Insufficient documentation

## 2015-08-23 DIAGNOSIS — R05 Cough: Secondary | ICD-10-CM | POA: Insufficient documentation

## 2015-08-23 DIAGNOSIS — R0600 Dyspnea, unspecified: Secondary | ICD-10-CM | POA: Insufficient documentation

## 2015-08-23 DIAGNOSIS — J90 Pleural effusion, not elsewhere classified: Secondary | ICD-10-CM | POA: Insufficient documentation

## 2015-08-25 ENCOUNTER — Encounter (HOSPITAL_COMMUNITY)
Admission: RE | Admit: 2015-08-25 | Discharge: 2015-08-25 | Disposition: A | Discharge status: Home / Self Care | Payer: Self-pay | Source: Ambulatory Visit | Attending: Internal Medicine | Admitting: Internal Medicine

## 2015-08-30 ENCOUNTER — Encounter (HOSPITAL_COMMUNITY)
Admission: RE | Admit: 2015-08-30 | Discharge: 2015-08-30 | Disposition: A | Discharge status: Home / Self Care | Payer: Self-pay | Source: Ambulatory Visit | Attending: Internal Medicine | Admitting: Internal Medicine

## 2015-09-01 ENCOUNTER — Encounter (HOSPITAL_COMMUNITY)
Admission: RE | Admit: 2015-09-01 | Discharge: 2015-09-01 | Disposition: A | Discharge status: Home / Self Care | Payer: Self-pay | Source: Ambulatory Visit | Attending: Internal Medicine | Admitting: Internal Medicine

## 2015-09-06 ENCOUNTER — Encounter (HOSPITAL_COMMUNITY)
Admission: RE | Admit: 2015-09-06 | Discharge: 2015-09-06 | Disposition: A | Discharge status: Home / Self Care | Payer: Self-pay | Source: Ambulatory Visit | Attending: Internal Medicine | Admitting: Internal Medicine

## 2015-09-08 ENCOUNTER — Encounter (HOSPITAL_COMMUNITY): Admission: RE | Admit: 2015-09-08 | Payer: Self-pay | Source: Ambulatory Visit

## 2015-09-08 DIAGNOSIS — Z23 Encounter for immunization: Secondary | ICD-10-CM | POA: Diagnosis not present

## 2015-09-08 DIAGNOSIS — J84112 Idiopathic pulmonary fibrosis: Secondary | ICD-10-CM | POA: Diagnosis not present

## 2015-09-08 DIAGNOSIS — J849 Interstitial pulmonary disease, unspecified: Secondary | ICD-10-CM | POA: Diagnosis not present

## 2015-09-08 DIAGNOSIS — R0602 Shortness of breath: Secondary | ICD-10-CM | POA: Diagnosis not present

## 2015-09-08 DIAGNOSIS — Z79899 Other long term (current) drug therapy: Secondary | ICD-10-CM | POA: Diagnosis not present

## 2015-09-13 ENCOUNTER — Encounter (HOSPITAL_COMMUNITY)
Admission: RE | Admit: 2015-09-13 | Discharge: 2015-09-13 | Disposition: A | Discharge status: Home / Self Care | Payer: Self-pay | Source: Ambulatory Visit | Attending: Internal Medicine | Admitting: Internal Medicine

## 2015-09-15 ENCOUNTER — Encounter (HOSPITAL_COMMUNITY): Admission: RE | Admit: 2015-09-15 | Payer: Self-pay | Source: Ambulatory Visit

## 2015-09-20 ENCOUNTER — Encounter (HOSPITAL_COMMUNITY)
Admission: RE | Admit: 2015-09-20 | Discharge: 2015-09-20 | Disposition: A | Discharge status: Home / Self Care | Payer: Self-pay | Source: Ambulatory Visit | Attending: Internal Medicine | Admitting: Internal Medicine

## 2015-09-20 DIAGNOSIS — K219 Gastro-esophageal reflux disease without esophagitis: Secondary | ICD-10-CM | POA: Insufficient documentation

## 2015-09-20 DIAGNOSIS — J90 Pleural effusion, not elsewhere classified: Secondary | ICD-10-CM | POA: Insufficient documentation

## 2015-09-20 DIAGNOSIS — R0600 Dyspnea, unspecified: Secondary | ICD-10-CM | POA: Insufficient documentation

## 2015-09-20 DIAGNOSIS — Z5189 Encounter for other specified aftercare: Secondary | ICD-10-CM | POA: Insufficient documentation

## 2015-09-20 DIAGNOSIS — J8410 Pulmonary fibrosis, unspecified: Secondary | ICD-10-CM | POA: Insufficient documentation

## 2015-09-20 DIAGNOSIS — E785 Hyperlipidemia, unspecified: Secondary | ICD-10-CM | POA: Insufficient documentation

## 2015-09-20 DIAGNOSIS — I10 Essential (primary) hypertension: Secondary | ICD-10-CM | POA: Insufficient documentation

## 2015-09-20 DIAGNOSIS — R05 Cough: Secondary | ICD-10-CM | POA: Insufficient documentation

## 2015-09-22 ENCOUNTER — Encounter (HOSPITAL_COMMUNITY): Payer: Self-pay

## 2015-09-27 ENCOUNTER — Encounter (HOSPITAL_COMMUNITY): Payer: Self-pay

## 2015-09-29 ENCOUNTER — Encounter (HOSPITAL_COMMUNITY): Payer: Self-pay

## 2015-10-04 ENCOUNTER — Encounter (HOSPITAL_COMMUNITY): Payer: Self-pay

## 2015-10-06 ENCOUNTER — Encounter (HOSPITAL_COMMUNITY): Payer: Self-pay

## 2015-10-11 ENCOUNTER — Encounter (HOSPITAL_COMMUNITY)
Admission: RE | Admit: 2015-10-11 | Discharge: 2015-10-11 | Disposition: A | Discharge status: Home / Self Care | Payer: Self-pay | Source: Ambulatory Visit | Attending: Internal Medicine | Admitting: Internal Medicine

## 2015-10-13 ENCOUNTER — Encounter (HOSPITAL_COMMUNITY)
Admission: RE | Admit: 2015-10-13 | Discharge: 2015-10-13 | Disposition: A | Discharge status: Home / Self Care | Payer: Self-pay | Source: Ambulatory Visit | Attending: Internal Medicine | Admitting: Internal Medicine

## 2015-10-14 ENCOUNTER — Ambulatory Visit (INDEPENDENT_AMBULATORY_CARE_PROVIDER_SITE_OTHER): Payer: Medicare Other | Admitting: Internal Medicine

## 2015-10-14 ENCOUNTER — Telehealth: Payer: Self-pay | Admitting: Internal Medicine

## 2015-10-14 ENCOUNTER — Encounter: Payer: Self-pay | Admitting: Internal Medicine

## 2015-10-14 VITALS — BP 116/74 | HR 57 | Ht 67.0 in | Wt 161.2 lb

## 2015-10-14 DIAGNOSIS — J9611 Chronic respiratory failure with hypoxia: Secondary | ICD-10-CM

## 2015-10-14 DIAGNOSIS — K219 Gastro-esophageal reflux disease without esophagitis: Secondary | ICD-10-CM | POA: Diagnosis not present

## 2015-10-14 DIAGNOSIS — J84112 Idiopathic pulmonary fibrosis: Secondary | ICD-10-CM

## 2015-10-14 MED ORDER — FAMOTIDINE 20 MG PO TABS: ORAL_TABLET | ORAL | Status: DC

## 2015-10-14 NOTE — Patient Instructions (Signed)
Ok to continue oxygen   Script sent for Pepcid/ famotadine  Acid blocker  Ok to continue perfenadone

## 2015-10-14 NOTE — Progress Notes (Signed)
Patient ID: [name] [name], male    DOB: 1935-01-26, 81 y.o.   MRN: [id number]  HPI 10/30/10- 43 yoM never smoker, followed for pulmonary fibrosis and RML nodule, complicated by bronchiectasis, GERD and HBP. Last here June 08, 2010- note reviewed.  He continues Pulmonary Rehab. He finished clinical trial at [facility] and has now started perfenadone/ Asend trial at [facility]. With these he is getting PFTs, 6MWT and xrays at [facility].  He notes that dyspnea is stable- doe on stairs, able to play golf twice weekly. Dry cough is gradually worse. Codeine cough syrup and benzonatate help.   05/01/11- 10/30/10- 29 yoM never smoker, followed for pulmonary fibrosis and RML nodule, complicated by bronchiectasis, GERD and HBP Has had flu vaccine. Still participating in pulmonary rehabilitation twice a week as he continues with a research drug protocol at [facility]. He had a negative test for reflux recently. He describes a protracted "cold" since November 1. He may have a little fever and discolored sputum at the beginning. We had sent a Z-Pak and he is now taking an Alka-Seltzer product. Over the last 6 months he has felt about the same. He notices chronic cough with deep breath but no progressive shortness of breath. He says he looked stable on a 6 minute walk test and PFT at [facility]. Spiriva did not help- it is no longer being used.  10/29/11-  75 yoM never smoker, followed for pulmonary fibrosis and RML nodule, complicated by bronchiectasis, GERD and HBP  Patient c/o "slight" sob, and cough. Denies wheezing. He finishes his second drug trial for interstitial lung disease at [facility] and is now getting "the real by mouth". The protocol includes chest x-ray, PFT and 6 minute walk test being done there so we will not repeat them. Subjectively, and from what he is told at [facility], he is basically stable with persistent cough. Cough wakes him early in the mornings . He continues to play golf regularly.  In March he had double hernia  repair. As he recovers, he will resume pulmonary rehabilitation here in July.  04/29/12- 55 yoM never smoker, followed for pulmonary fibrosis and RML nodule, complicated by bronchiectasis, GERD and HBP FOLLOWS FOR: dry cough and still having slight SOB-no worse than last OV. He is still being followed now on open label perfenadone/ Pulmonary Fibrosis trial at [facility] and says he is now getting the active drug. He is holding his own. At last office visit at [facility] in November, he desaturated to 88% on his 6 minute walk test. He continues pulmonary rehabilitation here and likes it. Had flu vaccine. Denies cardiac problems. Coughs for about one hour each morning and this is his biggest problem. Uses Tessalon Perles with Phenergan codeine taken once at night. Prilosec controls heartburn. Has had pneumonia vaccine and flu vaccine. CXR 07/13/11 IMPRESSION:  No active disease. Stable mild hyperinflation and chronic fibrotic  changes.  Original Report Authenticated By: Lahoma Crocker, M.D.   02/24/13- 78 yoM never smoker, followed for pulmonary fibrosis/ UIP and RML nodule, complicated by bronchiectasis, GERD and HBP FOLLOWS FOR:c/o chest congestion x 3 wks.,cough-clear,nasal drainage-clear,sob increased,occass wheezing when sleeping,denies cp or tightness,denies fcs,was at [place] last wk gave zpak finished  Caught a cold 3 weeks ago, slowly better. Still hoarse. Less congested. Finished Z-Pak. Had chest x-ray at [facility] where he continues perfenadone f/u for UIP now x 1.5 years. these. O2 2L/ [employer] for sleep.   02/24/14- 79 yoM never smoker, followed for pulmonary fibrosis/ UIP and RML nodule,  complicated by bronchiectasis, GERD and HBP FOLLOW FOR: Pulmonary Fibrosis; dry cough; no further complaints All Labs, Xrays, PFTs being done at [facility]  10/14/2015-81 year old male never smoker followed for pulmonary fibrosis/UIP, RML nodule,, comp. By bronchiectasis, GERD, HBP O2 3 L-5 L/[employer] CXR 07/28/2015-nodule not  identified IMPRESSION: Progressive pulmonary fibrosis. Mild enlargement of cardiac silhouette. Electronically Signed  By: Lavonia Dana M.D.  On: 07/28/2015 17:47 FOLLOWS FOR: Pt states his breathing is doing okay; wife states he is using more O2(DME [employer]). He was followed by [facility] drug research team- No longer on a research protocol.  He admits to slowly progressive dyspnea on exertion. Requiring oxygen 2 L at rest, 5 L exertion. Cough productive occasionally white. Cough syrup helps at night. Increased indigestion. He quit Prilosec, concerned about kidney function with PPIs. Still followed by [facility]. 2 brothers have died of UIP. Neither lived as long after diagnosis as he has.  Review of Systems- see HPI Constitutional:   No-   weight loss, night sweats, fevers, chills, fatigue, lassitude. HEENT:   No-  headaches, difficulty swallowing, tooth/dental problems, sore throat,       No-  sneezing, itching, ear ache, +nasal congestion, post nasal drip,  CV:  No-   chest pain, orthopnea, PND, swelling in lower extremities, anasarca, dizziness, palpitations Resp: +  shortness of breath with exertion or at rest.              + productive cough,  +non-productive cough,  No- coughing up of blood.              No-   change in color of mucus.  No- wheezing.   Skin: No-   rash or lesions. GI:  No-   heartburn, indigestion, abdominal pain, nausea, vomiting, GU: ain. MS:  No-   joint pain or swelling.   Neuro-     nothing unusual Psych:  No- change in mood or affect. No depression or anxiety.  No memory loss.   Objective:   Physical Exam General- Alert, Oriented, Affect-appropriate, Distress- none acute Skin- rash-none, lesions- none, excoriation- none Lymphadenopathy- none Head- atraumatic            Eyes- Gross vision intact, PERRLA, conjunctivae clear secretions            Ears- Hearing, canals-normal            Nose- Clear, no-Septal dev, mucus, polyps, erosion, perforation              Throat- Mallampati II , mucosa clear , drainage- none, tonsils- atrophic Neck- flexible , trachea midline, no stridor , thyroid nl, carotid no bruit Chest - symmetrical excursion , unlabored           Heart/CV- RRR , no murmur , no gallop  , no rub, nl s1 s2                           - JVD- none , edema- none, stasis changes- none, varices- none           Lung- +diffuse crackles, wheeze- none, +mild dry  cough , dullness-none, rub- none,+tripod                                          posture           Chest wall-  Abd-  Br/ Gen/ Rectal- Not done, not indicated Extrem- cyanosis- none, clubbing- none, atrophy- none, strength- nl Neuro- grossly intact to observation

## 2015-10-14 NOTE — Assessment & Plan Note (Signed)
He is using a lot of Tums trying to control heartburn symptoms on prevented. We discussed his concern about PPIs and can change to an H2 inhibitor. Plan-Pepcid

## 2015-10-14 NOTE — Telephone Encounter (Signed)
Spoke with pt. He needs a prescription sent in for Pepcid. This has been taken care of. Nothing further was needed.

## 2015-10-14 NOTE — Assessment & Plan Note (Signed)
He continues prevented him. Slow progression of disease. He understands implications.

## 2015-10-14 NOTE — Assessment & Plan Note (Signed)
Progressively higher oxygen dependence, but changing only slowly. No acute problem.

## 2015-10-18 ENCOUNTER — Encounter (HOSPITAL_COMMUNITY): Payer: Self-pay

## 2015-10-20 ENCOUNTER — Encounter (HOSPITAL_COMMUNITY)
Admission: RE | Admit: 2015-10-20 | Discharge: 2015-10-20 | Disposition: A | Discharge status: Home / Self Care | Payer: Self-pay | Source: Ambulatory Visit | Attending: Internal Medicine | Admitting: Internal Medicine

## 2015-10-20 DIAGNOSIS — R0600 Dyspnea, unspecified: Secondary | ICD-10-CM | POA: Insufficient documentation

## 2015-10-20 DIAGNOSIS — E785 Hyperlipidemia, unspecified: Secondary | ICD-10-CM | POA: Insufficient documentation

## 2015-10-20 DIAGNOSIS — I10 Essential (primary) hypertension: Secondary | ICD-10-CM | POA: Insufficient documentation

## 2015-10-20 DIAGNOSIS — R05 Cough: Secondary | ICD-10-CM | POA: Insufficient documentation

## 2015-10-20 DIAGNOSIS — K219 Gastro-esophageal reflux disease without esophagitis: Secondary | ICD-10-CM | POA: Insufficient documentation

## 2015-10-20 DIAGNOSIS — Z5189 Encounter for other specified aftercare: Secondary | ICD-10-CM | POA: Insufficient documentation

## 2015-10-20 DIAGNOSIS — J8410 Pulmonary fibrosis, unspecified: Secondary | ICD-10-CM | POA: Insufficient documentation

## 2015-10-20 DIAGNOSIS — J90 Pleural effusion, not elsewhere classified: Secondary | ICD-10-CM | POA: Insufficient documentation

## 2015-10-25 ENCOUNTER — Encounter (HOSPITAL_COMMUNITY)
Admission: RE | Admit: 2015-10-25 | Discharge: 2015-10-25 | Disposition: A | Discharge status: Home / Self Care | Payer: Self-pay | Source: Ambulatory Visit | Attending: Internal Medicine | Admitting: Internal Medicine

## 2015-10-27 ENCOUNTER — Encounter (HOSPITAL_COMMUNITY)
Admission: RE | Admit: 2015-10-27 | Discharge: 2015-10-27 | Disposition: A | Discharge status: Home / Self Care | Payer: Self-pay | Source: Ambulatory Visit | Attending: Internal Medicine | Admitting: Internal Medicine

## 2015-11-01 ENCOUNTER — Encounter (HOSPITAL_COMMUNITY)
Admission: RE | Admit: 2015-11-01 | Discharge: 2015-11-01 | Disposition: A | Discharge status: Home / Self Care | Payer: Self-pay | Source: Ambulatory Visit | Attending: Internal Medicine | Admitting: Internal Medicine

## 2015-11-03 ENCOUNTER — Encounter (HOSPITAL_COMMUNITY)
Admission: RE | Admit: 2015-11-03 | Discharge: 2015-11-03 | Disposition: A | Discharge status: Home / Self Care | Payer: Self-pay | Source: Ambulatory Visit | Attending: Internal Medicine | Admitting: Internal Medicine

## 2015-11-08 ENCOUNTER — Encounter (HOSPITAL_COMMUNITY)
Admission: RE | Admit: 2015-11-08 | Discharge: 2015-11-08 | Disposition: A | Discharge status: Home / Self Care | Payer: Self-pay | Source: Ambulatory Visit | Attending: Internal Medicine | Admitting: Internal Medicine

## 2015-11-10 ENCOUNTER — Encounter (HOSPITAL_COMMUNITY)
Admission: RE | Admit: 2015-11-10 | Discharge: 2015-11-10 | Disposition: A | Discharge status: Home / Self Care | Payer: Self-pay | Source: Ambulatory Visit | Attending: Internal Medicine | Admitting: Internal Medicine

## 2015-11-15 ENCOUNTER — Encounter (HOSPITAL_COMMUNITY)
Admission: RE | Admit: 2015-11-15 | Discharge: 2015-11-15 | Disposition: A | Discharge status: Home / Self Care | Payer: Self-pay | Source: Ambulatory Visit | Attending: Internal Medicine | Admitting: Internal Medicine

## 2015-11-17 ENCOUNTER — Encounter (HOSPITAL_COMMUNITY): Payer: Self-pay

## 2015-11-22 ENCOUNTER — Encounter (HOSPITAL_COMMUNITY): Payer: Self-pay

## 2015-11-22 DIAGNOSIS — J8410 Pulmonary fibrosis, unspecified: Secondary | ICD-10-CM | POA: Insufficient documentation

## 2015-11-22 DIAGNOSIS — R05 Cough: Secondary | ICD-10-CM | POA: Insufficient documentation

## 2015-11-22 DIAGNOSIS — J90 Pleural effusion, not elsewhere classified: Secondary | ICD-10-CM | POA: Insufficient documentation

## 2015-11-22 DIAGNOSIS — K219 Gastro-esophageal reflux disease without esophagitis: Secondary | ICD-10-CM | POA: Insufficient documentation

## 2015-11-22 DIAGNOSIS — I10 Essential (primary) hypertension: Secondary | ICD-10-CM | POA: Insufficient documentation

## 2015-11-22 DIAGNOSIS — E785 Hyperlipidemia, unspecified: Secondary | ICD-10-CM | POA: Insufficient documentation

## 2015-11-22 DIAGNOSIS — R0600 Dyspnea, unspecified: Secondary | ICD-10-CM | POA: Insufficient documentation

## 2015-11-22 DIAGNOSIS — Z5189 Encounter for other specified aftercare: Secondary | ICD-10-CM | POA: Insufficient documentation

## 2015-11-24 ENCOUNTER — Encounter (HOSPITAL_COMMUNITY)
Admission: RE | Admit: 2015-11-24 | Discharge: 2015-11-24 | Disposition: A | Discharge status: Home / Self Care | Payer: Self-pay | Source: Ambulatory Visit | Attending: Internal Medicine | Admitting: Internal Medicine

## 2015-11-29 ENCOUNTER — Encounter (HOSPITAL_COMMUNITY): Payer: Self-pay

## 2015-12-01 ENCOUNTER — Encounter (HOSPITAL_COMMUNITY): Payer: Self-pay

## 2015-12-06 ENCOUNTER — Encounter (HOSPITAL_COMMUNITY)
Admission: RE | Admit: 2015-12-06 | Discharge: 2015-12-06 | Disposition: A | Discharge status: Home / Self Care | Payer: Self-pay | Source: Ambulatory Visit | Attending: Internal Medicine | Admitting: Internal Medicine

## 2015-12-08 ENCOUNTER — Encounter (HOSPITAL_COMMUNITY)
Admission: RE | Admit: 2015-12-08 | Discharge: 2015-12-08 | Disposition: A | Discharge status: Home / Self Care | Payer: Self-pay | Source: Ambulatory Visit | Attending: Internal Medicine | Admitting: Internal Medicine

## 2015-12-13 ENCOUNTER — Encounter (HOSPITAL_COMMUNITY)
Admission: RE | Admit: 2015-12-13 | Discharge: 2015-12-13 | Disposition: A | Discharge status: Home / Self Care | Payer: Self-pay | Source: Ambulatory Visit | Attending: Internal Medicine | Admitting: Internal Medicine

## 2015-12-15 ENCOUNTER — Encounter (HOSPITAL_COMMUNITY)
Admission: RE | Admit: 2015-12-15 | Discharge: 2015-12-15 | Disposition: A | Discharge status: Home / Self Care | Payer: Self-pay | Source: Ambulatory Visit | Attending: Internal Medicine | Admitting: Internal Medicine

## 2015-12-20 ENCOUNTER — Encounter (HOSPITAL_COMMUNITY)
Admission: RE | Admit: 2015-12-20 | Discharge: 2015-12-20 | Disposition: A | Discharge status: Home / Self Care | Payer: Self-pay | Source: Ambulatory Visit | Attending: Internal Medicine | Admitting: Internal Medicine

## 2015-12-20 DIAGNOSIS — J90 Pleural effusion, not elsewhere classified: Secondary | ICD-10-CM | POA: Insufficient documentation

## 2015-12-20 DIAGNOSIS — I10 Essential (primary) hypertension: Secondary | ICD-10-CM | POA: Insufficient documentation

## 2015-12-20 DIAGNOSIS — K219 Gastro-esophageal reflux disease without esophagitis: Secondary | ICD-10-CM | POA: Insufficient documentation

## 2015-12-20 DIAGNOSIS — Z5189 Encounter for other specified aftercare: Secondary | ICD-10-CM | POA: Insufficient documentation

## 2015-12-20 DIAGNOSIS — R05 Cough: Secondary | ICD-10-CM | POA: Insufficient documentation

## 2015-12-20 DIAGNOSIS — E785 Hyperlipidemia, unspecified: Secondary | ICD-10-CM | POA: Insufficient documentation

## 2015-12-20 DIAGNOSIS — R0600 Dyspnea, unspecified: Secondary | ICD-10-CM | POA: Insufficient documentation

## 2015-12-20 DIAGNOSIS — J8410 Pulmonary fibrosis, unspecified: Secondary | ICD-10-CM | POA: Insufficient documentation

## 2015-12-22 ENCOUNTER — Encounter (HOSPITAL_COMMUNITY)
Admission: RE | Admit: 2015-12-22 | Discharge: 2015-12-22 | Disposition: A | Discharge status: Home / Self Care | Payer: Self-pay | Source: Ambulatory Visit | Attending: Internal Medicine | Admitting: Internal Medicine

## 2015-12-27 ENCOUNTER — Encounter (HOSPITAL_COMMUNITY): Payer: Self-pay

## 2015-12-29 ENCOUNTER — Encounter (HOSPITAL_COMMUNITY): Payer: Self-pay

## 2016-01-03 ENCOUNTER — Encounter (HOSPITAL_COMMUNITY)
Admission: RE | Admit: 2016-01-03 | Discharge: 2016-01-03 | Disposition: A | Discharge status: Home / Self Care | Payer: Self-pay | Source: Ambulatory Visit | Attending: Internal Medicine | Admitting: Internal Medicine

## 2016-01-05 ENCOUNTER — Encounter (HOSPITAL_COMMUNITY)
Admission: RE | Admit: 2016-01-05 | Discharge: 2016-01-05 | Disposition: A | Discharge status: Home / Self Care | Payer: Self-pay | Source: Ambulatory Visit | Attending: Internal Medicine | Admitting: Internal Medicine

## 2016-01-10 ENCOUNTER — Encounter (HOSPITAL_COMMUNITY)
Admission: RE | Admit: 2016-01-10 | Discharge: 2016-01-10 | Disposition: A | Discharge status: Home / Self Care | Payer: Self-pay | Source: Ambulatory Visit | Attending: Internal Medicine | Admitting: Internal Medicine

## 2016-01-12 ENCOUNTER — Encounter (HOSPITAL_COMMUNITY): Payer: Self-pay

## 2016-01-12 DIAGNOSIS — R0602 Shortness of breath: Secondary | ICD-10-CM | POA: Diagnosis not present

## 2016-01-12 DIAGNOSIS — R110 Nausea: Secondary | ICD-10-CM | POA: Diagnosis not present

## 2016-01-12 DIAGNOSIS — Z79899 Other long term (current) drug therapy: Secondary | ICD-10-CM | POA: Diagnosis not present

## 2016-01-12 DIAGNOSIS — J84112 Idiopathic pulmonary fibrosis: Secondary | ICD-10-CM | POA: Diagnosis not present

## 2016-01-12 DIAGNOSIS — J984 Other disorders of lung: Secondary | ICD-10-CM | POA: Diagnosis not present

## 2016-01-12 DIAGNOSIS — J849 Interstitial pulmonary disease, unspecified: Secondary | ICD-10-CM | POA: Diagnosis not present

## 2016-01-17 ENCOUNTER — Encounter (HOSPITAL_COMMUNITY): Payer: Self-pay

## 2016-01-19 ENCOUNTER — Encounter (HOSPITAL_COMMUNITY)
Admission: RE | Admit: 2016-01-19 | Discharge: 2016-01-19 | Disposition: A | Discharge status: Home / Self Care | Payer: Self-pay | Source: Ambulatory Visit | Attending: Internal Medicine | Admitting: Internal Medicine

## 2016-01-24 ENCOUNTER — Encounter (HOSPITAL_COMMUNITY): Payer: Self-pay

## 2016-01-24 DIAGNOSIS — L309 Dermatitis, unspecified: Secondary | ICD-10-CM | POA: Diagnosis not present

## 2016-01-24 DIAGNOSIS — L570 Actinic keratosis: Secondary | ICD-10-CM | POA: Diagnosis not present

## 2016-01-24 DIAGNOSIS — R05 Cough: Secondary | ICD-10-CM | POA: Insufficient documentation

## 2016-01-24 DIAGNOSIS — K219 Gastro-esophageal reflux disease without esophagitis: Secondary | ICD-10-CM | POA: Insufficient documentation

## 2016-01-24 DIAGNOSIS — J90 Pleural effusion, not elsewhere classified: Secondary | ICD-10-CM | POA: Insufficient documentation

## 2016-01-24 DIAGNOSIS — I10 Essential (primary) hypertension: Secondary | ICD-10-CM | POA: Insufficient documentation

## 2016-01-24 DIAGNOSIS — Z5189 Encounter for other specified aftercare: Secondary | ICD-10-CM | POA: Insufficient documentation

## 2016-01-24 DIAGNOSIS — R0600 Dyspnea, unspecified: Secondary | ICD-10-CM | POA: Insufficient documentation

## 2016-01-24 DIAGNOSIS — E785 Hyperlipidemia, unspecified: Secondary | ICD-10-CM | POA: Insufficient documentation

## 2016-01-24 DIAGNOSIS — J8410 Pulmonary fibrosis, unspecified: Secondary | ICD-10-CM | POA: Insufficient documentation

## 2016-01-26 ENCOUNTER — Encounter (HOSPITAL_COMMUNITY): Payer: Self-pay

## 2016-01-31 ENCOUNTER — Encounter (HOSPITAL_COMMUNITY): Payer: Self-pay

## 2016-02-02 ENCOUNTER — Encounter (HOSPITAL_COMMUNITY)
Admission: RE | Admit: 2016-02-02 | Discharge: 2016-02-02 | Disposition: A | Discharge status: Home / Self Care | Payer: Self-pay | Source: Ambulatory Visit | Attending: Internal Medicine | Admitting: Internal Medicine

## 2016-02-07 ENCOUNTER — Encounter (HOSPITAL_COMMUNITY)
Admission: RE | Admit: 2016-02-07 | Discharge: 2016-02-07 | Disposition: A | Discharge status: Home / Self Care | Payer: Self-pay | Source: Ambulatory Visit | Attending: Internal Medicine | Admitting: Internal Medicine

## 2016-02-09 ENCOUNTER — Encounter (HOSPITAL_COMMUNITY)
Admission: RE | Admit: 2016-02-09 | Discharge: 2016-02-09 | Disposition: A | Discharge status: Home / Self Care | Payer: Self-pay | Source: Ambulatory Visit | Attending: Internal Medicine | Admitting: Internal Medicine

## 2016-02-14 ENCOUNTER — Encounter (HOSPITAL_COMMUNITY)
Admission: RE | Admit: 2016-02-14 | Discharge: 2016-02-14 | Disposition: A | Discharge status: Home / Self Care | Payer: Self-pay | Source: Ambulatory Visit | Attending: Internal Medicine | Admitting: Internal Medicine

## 2016-02-14 DIAGNOSIS — Z23 Encounter for immunization: Secondary | ICD-10-CM | POA: Diagnosis not present

## 2016-02-16 ENCOUNTER — Encounter (HOSPITAL_COMMUNITY)
Admission: RE | Admit: 2016-02-16 | Discharge: 2016-02-16 | Disposition: A | Discharge status: Home / Self Care | Payer: Self-pay | Source: Ambulatory Visit | Attending: Internal Medicine | Admitting: Internal Medicine

## 2016-02-21 ENCOUNTER — Encounter (HOSPITAL_COMMUNITY)
Admission: RE | Admit: 2016-02-21 | Discharge: 2016-02-21 | Disposition: A | Discharge status: Home / Self Care | Payer: Self-pay | Source: Ambulatory Visit | Attending: Internal Medicine | Admitting: Internal Medicine

## 2016-02-21 DIAGNOSIS — J8410 Pulmonary fibrosis, unspecified: Secondary | ICD-10-CM | POA: Insufficient documentation

## 2016-02-21 DIAGNOSIS — J90 Pleural effusion, not elsewhere classified: Secondary | ICD-10-CM | POA: Insufficient documentation

## 2016-02-21 DIAGNOSIS — K219 Gastro-esophageal reflux disease without esophagitis: Secondary | ICD-10-CM | POA: Insufficient documentation

## 2016-02-21 DIAGNOSIS — R05 Cough: Secondary | ICD-10-CM | POA: Insufficient documentation

## 2016-02-21 DIAGNOSIS — Z5189 Encounter for other specified aftercare: Secondary | ICD-10-CM | POA: Insufficient documentation

## 2016-02-21 DIAGNOSIS — E785 Hyperlipidemia, unspecified: Secondary | ICD-10-CM | POA: Insufficient documentation

## 2016-02-21 DIAGNOSIS — I10 Essential (primary) hypertension: Secondary | ICD-10-CM | POA: Insufficient documentation

## 2016-02-21 DIAGNOSIS — R0600 Dyspnea, unspecified: Secondary | ICD-10-CM | POA: Insufficient documentation

## 2016-02-23 ENCOUNTER — Encounter (HOSPITAL_COMMUNITY)
Admission: RE | Admit: 2016-02-23 | Discharge: 2016-02-23 | Disposition: A | Discharge status: Home / Self Care | Payer: Self-pay | Source: Ambulatory Visit | Attending: Internal Medicine | Admitting: Internal Medicine

## 2016-02-28 ENCOUNTER — Encounter (HOSPITAL_COMMUNITY)
Admission: RE | Admit: 2016-02-28 | Discharge: 2016-02-28 | Disposition: A | Discharge status: Home / Self Care | Payer: Self-pay | Source: Ambulatory Visit | Attending: Internal Medicine | Admitting: Internal Medicine

## 2016-03-01 ENCOUNTER — Encounter (HOSPITAL_COMMUNITY): Payer: Self-pay

## 2016-03-02 DIAGNOSIS — Z8551 Personal history of malignant neoplasm of bladder: Secondary | ICD-10-CM | POA: Diagnosis not present

## 2016-03-02 DIAGNOSIS — N401 Enlarged prostate with lower urinary tract symptoms: Secondary | ICD-10-CM | POA: Diagnosis not present

## 2016-03-02 DIAGNOSIS — R351 Nocturia: Secondary | ICD-10-CM | POA: Diagnosis not present

## 2016-03-05 ENCOUNTER — Inpatient Hospital Stay (HOSPITAL_COMMUNITY)
Admission: EM | Admit: 2016-03-05 | Discharge: 2016-03-08 | DRG: 871 | Disposition: A | Discharge status: Home / Self Care | Payer: Medicare Other | Attending: Internal Medicine | Admitting: Internal Medicine

## 2016-03-05 ENCOUNTER — Encounter (HOSPITAL_COMMUNITY): Payer: Self-pay | Admitting: Emergency Medicine

## 2016-03-05 ENCOUNTER — Emergency Department (HOSPITAL_COMMUNITY): Payer: Medicare Other

## 2016-03-05 DIAGNOSIS — Z66 Do not resuscitate: Secondary | ICD-10-CM | POA: Diagnosis present

## 2016-03-05 DIAGNOSIS — E871 Hypo-osmolality and hyponatremia: Secondary | ICD-10-CM | POA: Diagnosis present

## 2016-03-05 DIAGNOSIS — J9621 Acute and chronic respiratory failure with hypoxia: Secondary | ICD-10-CM | POA: Diagnosis present

## 2016-03-05 DIAGNOSIS — Z8551 Personal history of malignant neoplasm of bladder: Secondary | ICD-10-CM | POA: Diagnosis not present

## 2016-03-05 DIAGNOSIS — Z7982 Long term (current) use of aspirin: Secondary | ICD-10-CM | POA: Diagnosis not present

## 2016-03-05 DIAGNOSIS — A419 Sepsis, unspecified organism: Secondary | ICD-10-CM

## 2016-03-05 DIAGNOSIS — Z825 Family history of asthma and other chronic lower respiratory diseases: Secondary | ICD-10-CM

## 2016-03-05 DIAGNOSIS — J1100 Influenza due to unidentified influenza virus with unspecified type of pneumonia: Secondary | ICD-10-CM | POA: Diagnosis present

## 2016-03-05 DIAGNOSIS — I10 Essential (primary) hypertension: Secondary | ICD-10-CM | POA: Diagnosis present

## 2016-03-05 DIAGNOSIS — N179 Acute kidney failure, unspecified: Secondary | ICD-10-CM

## 2016-03-05 DIAGNOSIS — K219 Gastro-esophageal reflux disease without esophagitis: Secondary | ICD-10-CM | POA: Diagnosis present

## 2016-03-05 DIAGNOSIS — Z9981 Dependence on supplemental oxygen: Secondary | ICD-10-CM

## 2016-03-05 DIAGNOSIS — N183 Chronic kidney disease, stage 3 unspecified: Secondary | ICD-10-CM | POA: Diagnosis present

## 2016-03-05 DIAGNOSIS — D696 Thrombocytopenia, unspecified: Secondary | ICD-10-CM | POA: Diagnosis present

## 2016-03-05 DIAGNOSIS — J84112 Idiopathic pulmonary fibrosis: Secondary | ICD-10-CM | POA: Diagnosis present

## 2016-03-05 DIAGNOSIS — J111 Influenza due to unidentified influenza virus with other respiratory manifestations: Secondary | ICD-10-CM | POA: Diagnosis not present

## 2016-03-05 DIAGNOSIS — G894 Chronic pain syndrome: Secondary | ICD-10-CM | POA: Diagnosis present

## 2016-03-05 DIAGNOSIS — Z8249 Family history of ischemic heart disease and other diseases of the circulatory system: Secondary | ICD-10-CM

## 2016-03-05 DIAGNOSIS — Z79899 Other long term (current) drug therapy: Secondary | ICD-10-CM | POA: Diagnosis not present

## 2016-03-05 DIAGNOSIS — R52 Pain, unspecified: Secondary | ICD-10-CM | POA: Diagnosis not present

## 2016-03-05 DIAGNOSIS — E785 Hyperlipidemia, unspecified: Secondary | ICD-10-CM | POA: Diagnosis present

## 2016-03-05 DIAGNOSIS — R0602 Shortness of breath: Secondary | ICD-10-CM | POA: Diagnosis not present

## 2016-03-05 DIAGNOSIS — I129 Hypertensive chronic kidney disease with stage 1 through stage 4 chronic kidney disease, or unspecified chronic kidney disease: Secondary | ICD-10-CM | POA: Diagnosis present

## 2016-03-05 DIAGNOSIS — J189 Pneumonia, unspecified organism: Secondary | ICD-10-CM

## 2016-03-05 LAB — BASIC METABOLIC PANEL
Anion gap: 8 (ref 5–15)
BUN: 21 mg/dL — AB (ref 6–20)
CALCIUM: 8.8 mg/dL — AB (ref 8.9–10.3)
CHLORIDE: 88 mmol/L — AB (ref 101–111)
CO2: 30 mmol/L (ref 22–32)
CREATININE: 1.43 mg/dL — AB (ref 0.61–1.24)
GFR calc Af Amer: 52 mL/min — ABNORMAL LOW (ref >60)
GFR calc non Af Amer: 45 mL/min — ABNORMAL LOW (ref >60)
GLUCOSE: 134 mg/dL — AB (ref 65–99)
Potassium: 4 mmol/L (ref 3.5–5.1)
Sodium: 126 mmol/L — ABNORMAL LOW (ref 135–145)

## 2016-03-05 LAB — CBC
HCT: 42.3 % (ref 39.0–52.0)
Hemoglobin: 14.8 g/dL (ref 13.0–17.0)
MCH: 29.9 pg (ref 26.0–34.0)
MCHC: 35 g/dL (ref 30.0–36.0)
MCV: 85.5 fL (ref 78.0–100.0)
PLATELETS: 144 10*3/uL — AB (ref 150–400)
RBC: 4.95 MIL/uL (ref 4.22–5.81)
RDW: 13.3 % (ref 11.5–15.5)
WBC: 24.8 10*3/uL — ABNORMAL HIGH (ref 4.0–10.5)

## 2016-03-05 LAB — I-STAT TROPONIN, ED: TROPONIN I, POC: 0.02 ng/mL (ref 0.00–0.08)

## 2016-03-05 MED ORDER — FAMOTIDINE 20 MG PO TABS
20.0000 mg | ORAL_TABLET | Freq: Two times a day (BID) | ORAL | Status: DC
  Administered 2016-03-05 – 2016-03-08 (×6): 20 mg via ORAL
  Filled 2016-03-05 (×7): qty 1

## 2016-03-05 MED ORDER — SODIUM CHLORIDE 0.9 % IV BOLUS (SEPSIS)
1000.0000 mL | Freq: Once | INTRAVENOUS | Status: AC
  Administered 2016-03-05: 1000 mL via INTRAVENOUS

## 2016-03-05 MED ORDER — HYDROCODONE-HOMATROPINE 5-1.5 MG/5ML PO SYRP
5.0000 mL | ORAL_SOLUTION | Freq: Four times a day (QID) | ORAL | Status: DC | PRN
  Administered 2016-03-06: 5 mL via ORAL
  Filled 2016-03-05 (×2): qty 5

## 2016-03-05 MED ORDER — PIRFENIDONE 267 MG PO CAPS
801.0000 mg | ORAL_CAPSULE | Freq: Two times a day (BID) | ORAL | Status: DC
  Administered 2016-03-06 – 2016-03-08 (×5): 801 mg via ORAL
  Filled 2016-03-05 (×4): qty 3

## 2016-03-05 MED ORDER — ACETAMINOPHEN 650 MG RE SUPP: 650.0000 mg | Freq: Four times a day (QID) | RECTAL | Status: DC | PRN

## 2016-03-05 MED ORDER — ATENOLOL 25 MG PO TABS
25.0000 mg | ORAL_TABLET | Freq: Every day | ORAL | Status: DC
  Administered 2016-03-06 – 2016-03-08 (×3): 25 mg via ORAL
  Filled 2016-03-05 (×3): qty 1

## 2016-03-05 MED ORDER — OSELTAMIVIR PHOSPHATE 75 MG PO CAPS
75.0000 mg | ORAL_CAPSULE | Freq: Two times a day (BID) | ORAL | Status: DC
  Administered 2016-03-05: 75 mg via ORAL
  Filled 2016-03-05 (×2): qty 1

## 2016-03-05 MED ORDER — ATENOLOL 25 MG PO TABS: 25.0000 mg | ORAL_TABLET | Freq: Every day | ORAL | Status: DC

## 2016-03-05 MED ORDER — ASPIRIN 325 MG PO TABS
162.5000 mg | ORAL_TABLET | Freq: Every day | ORAL | Status: DC
  Administered 2016-03-06 – 2016-03-08 (×3): 162.5 mg via ORAL
  Filled 2016-03-05 (×3): qty 1

## 2016-03-05 MED ORDER — MORPHINE SULFATE ER 15 MG PO TBCR
15.0000 mg | EXTENDED_RELEASE_TABLET | Freq: Every day | ORAL | Status: DC
  Administered 2016-03-05 – 2016-03-07 (×3): 15 mg via ORAL
  Filled 2016-03-05 (×3): qty 1

## 2016-03-05 MED ORDER — ONDANSETRON HCL 4 MG/2ML IJ SOLN
4.0000 mg | Freq: Four times a day (QID) | INTRAMUSCULAR | Status: DC | PRN
  Administered 2016-03-07 – 2016-03-08 (×2): 4 mg via INTRAVENOUS
  Filled 2016-03-05 (×2): qty 2

## 2016-03-05 MED ORDER — FAMOTIDINE 20 MG PO TABS: 20.0000 mg | ORAL_TABLET | Freq: Every day | ORAL | Status: DC

## 2016-03-05 MED ORDER — DOCUSATE SODIUM 100 MG PO CAPS
100.0000 mg | ORAL_CAPSULE | Freq: Two times a day (BID) | ORAL | Status: DC
  Filled 2016-03-05 (×5): qty 1

## 2016-03-05 MED ORDER — ATORVASTATIN CALCIUM 80 MG PO TABS
80.0000 mg | ORAL_TABLET | Freq: Every day | ORAL | Status: DC
  Administered 2016-03-05: 80 mg via ORAL
  Filled 2016-03-05 (×2): qty 1

## 2016-03-05 MED ORDER — SODIUM CHLORIDE 0.9% FLUSH
3.0000 mL | Freq: Two times a day (BID) | INTRAVENOUS | Status: DC
  Administered 2016-03-05 – 2016-03-07 (×5): 3 mL via INTRAVENOUS

## 2016-03-05 MED ORDER — CIPROFLOXACIN IN D5W 400 MG/200ML IV SOLN
400.0000 mg | Freq: Two times a day (BID) | INTRAVENOUS | Status: DC
  Administered 2016-03-05 – 2016-03-07 (×4): 400 mg via INTRAVENOUS
  Filled 2016-03-05 (×4): qty 200

## 2016-03-05 MED ORDER — PANTOPRAZOLE SODIUM 40 MG PO TBEC
40.0000 mg | DELAYED_RELEASE_TABLET | Freq: Every day | ORAL | Status: DC
  Administered 2016-03-06 – 2016-03-07 (×2): 40 mg via ORAL
  Filled 2016-03-05 (×3): qty 1

## 2016-03-05 MED ORDER — PIRFENIDONE 267 MG PO CAPS: ORAL_CAPSULE | Freq: Three times a day (TID) | ORAL | Status: DC

## 2016-03-05 MED ORDER — ACETAMINOPHEN 325 MG PO TABS: 650.0000 mg | ORAL_TABLET | Freq: Four times a day (QID) | ORAL | Status: DC | PRN

## 2016-03-05 MED ORDER — TAMSULOSIN HCL 0.4 MG PO CAPS: 0.4000 mg | ORAL_CAPSULE | Freq: Every day | ORAL | Status: DC

## 2016-03-05 MED ORDER — LOPERAMIDE HCL 2 MG PO CAPS
2.0000 mg | ORAL_CAPSULE | Freq: Once | ORAL | Status: AC
  Administered 2016-03-05: 2 mg via ORAL
  Filled 2016-03-05: qty 1

## 2016-03-05 MED ORDER — PIRFENIDONE 267 MG PO CAPS
534.0000 mg | ORAL_CAPSULE | Freq: Every day | ORAL | Status: DC
  Administered 2016-03-06 – 2016-03-07 (×2): 534 mg via ORAL
  Filled 2016-03-05 (×6): qty 2

## 2016-03-05 MED ORDER — ONDANSETRON HCL 4 MG PO TABS: 4.0000 mg | ORAL_TABLET | Freq: Four times a day (QID) | ORAL | Status: DC | PRN

## 2016-03-05 MED ORDER — ALPRAZOLAM 0.25 MG PO TABS
0.2500 mg | ORAL_TABLET | Freq: Every day | ORAL | Status: DC | PRN
  Administered 2016-03-05 – 2016-03-07 (×4): 0.25 mg via ORAL
  Filled 2016-03-05 (×4): qty 1

## 2016-03-05 MED ORDER — ENOXAPARIN SODIUM 30 MG/0.3ML ~~LOC~~ SOLN
30.0000 mg | Freq: Every day | SUBCUTANEOUS | Status: DC
  Administered 2016-03-05: 30 mg via SUBCUTANEOUS
  Filled 2016-03-05: qty 0.3

## 2016-03-05 MED ORDER — PIPERACILLIN-TAZOBACTAM 3.375 G IVPB 30 MIN
3.3750 g | Freq: Once | INTRAVENOUS | Status: AC
  Administered 2016-03-05: 3.375 g via INTRAVENOUS
  Filled 2016-03-05: qty 50

## 2016-03-05 MED ORDER — TAMSULOSIN HCL 0.4 MG PO CAPS
0.4000 mg | ORAL_CAPSULE | Freq: Every day | ORAL | Status: DC
  Administered 2016-03-06 – 2016-03-08 (×3): 0.4 mg via ORAL
  Filled 2016-03-05 (×3): qty 1

## 2016-03-05 MED ORDER — PANTOPRAZOLE SODIUM 40 MG PO TBEC: 40.0000 mg | DELAYED_RELEASE_TABLET | Freq: Every day | ORAL | Status: DC

## 2016-03-05 NOTE — ED Notes (Signed)
While ambulating pt's sats stayed at 100% on 6L

## 2016-03-05 NOTE — ED Notes (Signed)
Meal tray at bedside.  

## 2016-03-05 NOTE — ED Provider Notes (Signed)
[facility] DEPT Provider Note  CSN: [id number] Arrival Date & Time: 03/05/16 @ 70  History    Chief Complaint Chief Complaint  Patient presents with  . Shortness of Breath    HPI [name] [name] is a 81 y.o. male.  Patient presents to the ED via private vehicle for assessment of positive influenza swab today at PCP.  Duration has been since Saturday. Quality described as generalized fatigue, muscle aches that are diffuse with nausea. Modifying factors are not found. Has received one dose of tamiflu after leaving PCP office due to chronic lung fibrosis.  Patient denies CP but mildly short of breath. Has chronic non-productive cough. Patient is on continuous [place] 4 L oxygen. Patient is weak and having lightheadedness when standing and ambulating. Patients does not have increased oxygen requirement. Patient endorses he has been concerned for "aspirating mouth secretions when I lay back at night."  Past Medical & Surgical History    Past Medical History:  Diagnosis Date  . Bradycardia   . Cancer ([place])    bladder  . Esophageal reflux   . Fluttering heart   . Hx of bladder cancer 08/26/2014   Claudia Desanctis, CCRP spoke with patient wife in the presence of PI, Dr. Chase Caller.  AFFERENT cough IPF study:  Wife narrated, "bladder Cancer in 2010...states, had a small spot removed, no additional treatment required and is in complete remission"   . IPF (idiopathic pulmonary fibrosis) ([place])   . Lung nodule   . Other and unspecified hyperlipidemia   . Palpitations   . Pulmonary fibrosis ([place])   . Unspecified essential hypertension    Patient Active Problem List   Diagnosis Date Noted  . Influenza 03/05/2016  . Renal insufficiency, mild 09/30/2014  . Creatinine elevation 09/30/2014  . Hx of bladder cancer 08/26/2014  . Patient in clinical research study 07/18/2014  . Chronic respiratory failure with hypoxia ([place]) 06/17/2014  . IPF (idiopathic pulmonary fibrosis) ([place]) 06/16/2014   . Femoral hernia, left 08/27/2011  . Bilateral inguinal herniae (BIH), R>L 06/11/2011  . BRONCHITIS, ACUTE 06/08/2010  . DYSPNEA 01/01/2008  . HYPERLIPIDEMIA 06/04/2007  . HYPERTENSION 06/04/2007  . G E R D 06/04/2007  . BRONCHIECTASIS 03/06/2007  . PULMONARY NODULE, RIGHT MIDDLE LOBE 03/06/2007   Past Surgical History:  Procedure Laterality Date  . CARDIOVASCULAR STRESS TEST  07/29/2009   ef 64%  . CYSTOSCOPY     removal of bladder tumor  . FEMORAL HERNIA REPAIR  07/20/2011   Procedure: HERNIA REPAIR FEMORAL;  Surgeon: Adin Hector, MD;  Location: [place];  Service: General;  Laterality: Left;  . INGUINAL HERNIA REPAIR  07/20/2011   Procedure: LAPAROSCOPIC BILATERAL INGUINAL HERNIA REPAIR;  Surgeon: Adin Hector, MD;  Location: [place];  Service: General;  Laterality: Bilateral;  . US ECHOCARDIOGRAPHY  07/25/2009   ef 55-60%    Family & Social History    Family History  Problem Relation Age of Onset  . Coronary artery disease    . COPD    . Pulmonary fibrosis Brother   . COPD Mother   . Heart failure Father    Social History  Substance Use Topics  . Smoking status: Never Smoker  . Smokeless tobacco: Not on file  . Alcohol use 1.2 oz/week    2 Cans of beer per week    Home Medications    Prior to Admission medications   Medication Sig Start Date End Date Taking? Authorizing Provider  ALPRAZolam Duanne Moron) 0.25 MG tablet Take  1 tablet (0.25 mg total) by mouth daily as needed for anxiety. 07/19/14  Yes Brand Males, MD  aspirin 325 MG tablet Take 162.5 mg by mouth daily.   Yes Historical Provider, MD  atenolol (TENORMIN) 25 MG tablet Take 25 mg by mouth daily. 02/08/11  Yes Darlin Coco, MD  atorvastatin (LIPITOR) 80 MG tablet Take 80 mg by mouth daily.    Yes Historical Provider, MD  famotidine (PEPCID) 20 MG tablet 1 twice daily before meals Patient taking differently: Take 20 mg by mouth daily.  10/14/15  Yes Deneise Lever, MD  HYDROcodone-homatropine [facility])  5-1.5 MG/5ML syrup Take 5 mLs by mouth every 6 (six) hours as needed for cough.   Yes Historical Provider, MD  KLOR-CON M10 10 MEQ tablet Take 1 tablet by mouth daily. 04/02/12  Yes Historical Provider, MD  morphine (MS CONTIN) 15 MG 12 hr tablet Take 15 mg by mouth at bedtime. 03/05/16 04/04/16 Yes Historical Provider, MD  omeprazole (PRILOSEC) 20 MG capsule Take 20 mg by mouth daily. 30 minutes before breakfast   Yes Historical Provider, MD  oseltamivir (TAMIFLU) 75 MG capsule Take 75 mg by mouth 2 (two) times daily. 03/05/16  Yes Historical Provider, MD  Pirfenidone 267 MG CAPS Take 3 tablets by mouth 3 (three) times daily. 09/01/13  Yes Historical Provider, MD  tamsulosin (FLOMAX) 0.4 MG CAPS capsule Take 0.4 mg by mouth daily.   Yes Historical Provider, MD  telmisartan-hydrochlorothiazide (MICARDIS HCT) 80-25 MG per tablet Take 1 tablet by mouth daily.   Yes Historical Provider, MD  Vitamins/Minerals TABS Take 1 tablet by mouth daily.   Yes Historical Provider, MD    Allergies    Review of patient's allergies indicates no known allergies.  I reviewed & agree with nursing's documentation on the patient's past medical, surgical, social & family histories as well as their allergies.  Review of Systems  Complete ROS obtained, and is negative except as stated in HPI.  Physical Exam  Updated Vital Signs BP 141/68   Pulse 81   Temp 98.5 F (36.9 C) (Oral)   Resp (!) 36   SpO2 99%  I have reviewed the triage vital signs and the nursing notes. Physical Exam CONST: Patient oriented to person, place and time, in mild to moderate distress.  EYES: PERRLA. EOMI. Conjunctiva w/o d/c. Lids AT w/o swelling.  ENMT: External Nares & Ears AT w/o swelling. Oropharynx patent. MM dry.  NECK: ROM full w/o rigidity. Trachea midline. JVD absent.  CVS: +S1/S2 w/o obvious murmur. Lower extremities w/o pitting edema.  RESP: Respiratory effort unlabored w/o retractions & accessory muscle use. BS with  diffuse rhonchi w/o rales bilaterally. No stridor or wheezing.  GI: Soft & ND. +BS x 4. TTP absent. Hernia absent. Guarding & Rebound absent.  BACK: CVA TTP absent bilaterally.  SKIN: Skin warm & dry. Turgor poor. No rash. No petechiae or purpura.  PSYCH: Alert. Oriented. Affect and mood appropriate.  NEURO: CN II-XII grossly intact. Motor exam symmetric w/ upper & lower extremities 5/5 bilaterally. Sensation grossly intact.  MSK: Joints located & stable, w/o obvious dislocation & obvious deformity or crepitus absent w/ Cap refill < 2 sec. Peripheral pulses 2+ & equal in all extremities.   ED Treatments & Results   Labs (only abnormal results are displayed) Labs Reviewed  BASIC METABOLIC PANEL - Abnormal; Notable for the following:       Result Value   Sodium 126 (*)    Chloride 88 (*)  Glucose, Bld 134 (*)    BUN 21 (*)    Creatinine, Ser 1.43 (*)    Calcium 8.8 (*)    GFR calc non Af Amer 45 (*)    GFR calc Af Amer 52 (*)    All other components within normal limits  CBC - Abnormal; Notable for the following:    WBC 24.8 (*)    Platelets 144 (*)    All other components within normal limits  RESPIRATORY PANEL BY PCR  I-STAT TROPOININ, ED    EKG    EKG Interpretation  Date/Time:    Ventricular Rate:    PR Interval:    QRS Duration:   QT Interval:    QTC Calculation:   R Axis:     Text Interpretation:         Radiology Dg Chest 2 View  Result Date: 03/05/2016 CLINICAL DATA:  Increase shortness of breath over the past 3 days, positive feet test today EXAM: CHEST  2 VIEW COMPARISON:  07/28/2015 FINDINGS: Cardiac shadow is stable. Diffuse fibrotic changes are again identified bilaterally. No focal confluent infiltrate or sizable effusion is seen. No bony abnormality is noted. IMPRESSION: Chronic fibrotic changes bilaterally without acute abnormality. Electronically Signed   By: Inez Catalina M.D.   On: 03/05/2016 15:16    Pertinent labs & imaging results that were  available during my care of the patient were independently visualized by me and considered in my medical decision making, please see chart for details. Formal interpretation provided by Radiology.  Procedures (including critical care time) Procedures  Medications Ordered in ED Medications  oseltamivir (TAMIFLU) capsule 75 mg (75 mg Oral Given 03/05/16 1828)  sodium chloride 0.9 % bolus 1,000 mL (1,000 mLs Intravenous New Bag/Given 03/05/16 1828)    Initial Impression & Plan / ED Course & Results / Final Disposition   Initial Impression & Plan Patient is a 81 year old gentleman who presents emergency department after being swabbed for influenza which was positive at PCP office earlier today. Patient was sent by primary care physician to obtain chest x-ray to evaluate for evidence of pneumonia. Patient has had fevers chills, fatigue, malaise diffuse body aches that are consistent with influenza since Saturday. Patient appears well however concerns for ability to care for self at home had at this time therefore intuition trial performed in the emergency department with pulse oxygenation to evaluate for new oxygen requirement. Patient was able to ambulate without syncopal or near syncope event however did become hypoxic to the low 80s and was required to be returned to the bed..  ED Course & Results Upon review patient's laboratory work I appreciate RVP is still pending at time of admission. I-STAT troponin undetectable, CBC reveals leukocytosis with white blood cell count of 24.8 and platelets of 144. Patient however has no evidence of coagulopathy or other bleeding diastases. BMP remarkable for sodium of 126 and chloride of 88 with creatinine 1.43 compared to patient's baseline creatinine of 1.2.   Patient's EKG was obtained and upon my independent review appreciate patient is in normal sinus without tachycardia and there is no evidence of acute STEMI or supple changes concerning for ischemia.  Patient is no preexcitation or evidence of advanced heart block or bundle branch block. No hyperkalemia or evidence of arrhythmia.  Final Disposition Reassessment of the patient reveals patient is high risk for outpatient treatment of influenza due to significant interstitial lung disease and oxygen requirement at baseline. Due to patient's desaturations during ambulation decision was  made to admit the patient for further observation and treatment of influenza. In light of above & the complexity of the patient's medical condition, I believe the patient will require admission for continued medical intervention. ED Course in its entirety was reviewed w/ the patient and relative(s). I therefore consulted the Internal Medicine Service for admission & we discussed the patient's ED course & they have agreed to admit. They request no further interventions prior to the patient's transport from the ED. Patient stable for transport. Level of Care determined by the Admitting Service.  Final Clinical Impression & ED Diagnoses   1. Influenza    Patient care discussed with the attending physician, Dr. Laverta Baltimore, who oversaw their evaluation & treatment & voiced agreement.  Note: This document was prepared using Dragon voice recognition software and may include unintentional dictation errors.  House Officer: Voncille Lo, MD, Emergency Medicine Resident.   Voncille Lo, MD 03/05/16 1830    Margette Fast, MD 03/06/16 1017

## 2016-03-05 NOTE — ED Notes (Signed)
Attempted report x1. 

## 2016-03-05 NOTE — ED Notes (Signed)
Attempted report x 2 

## 2016-03-05 NOTE — ED Triage Notes (Signed)
Pt sts increased SOB x 3 days with positive flu test today at PCP; pt on 4 L/min O2

## 2016-03-05 NOTE — Progress Notes (Signed)
Pharmacy Antibiotic Note  [name] [name] is a 81 y.o. male admitted on 03/05/2016 with pneumonia.  Pharmacy has been consulted for zosyn and cipro dosing.  Plan: Zosyn 3.375g IV q8h (4 hour infusion).  cipro 400mg  IV q12 hours Clinical progression    Temp (24hrs), Avg:98.7 F (37.1 C), Min:98.5 F (36.9 C), Max:98.9 F (37.2 C)   Recent Labs Lab 03/05/16 1442  WBC 24.8*  CREATININE 1.43*    CrCl cannot be calculated (Unknown ideal weight.).    No Known Allergies  Thank you for allowing pharmacy to be a part of this patient's care.  [name] [name] 03/05/2016 10:12 PM

## 2016-03-05 NOTE — H&P (Signed)
History and Physical    [name] [name] [id number] DOB: [birth year] DOA: 03/05/2016  PCP: [name], MD Consultants:  Pulmonology - [facility] Dyke ([facility]); Urology - [facility] Patient coming from: home - lives with wife  Chief Complaint: SOB  HPI: [name] [name] is a 81 y.o. male with medical history significant of severe idiopathic pulmonary fibrosis with chronic respiratory failure, on 4-5L home O2; HTN; h/o bladder CA; and GERD presenting with worsening SOB.  Patient reports he has been sick since Saturday AM - chills, aching all over, nauseated.  Symptoms continued Sunday.  Called PCP this AM and made an appointment.  Flu positive.  4-5L home O2, has had some worsening SOB and turned O2 up to 6L.  Decreased PO, anorexia, living on Ensure for the last 3 days.  Subjective fever.  No emesis.  No diarrhea.  Generalized myalgias.    Had cystoscopy on Friday, started getting sick on Saturday.  Wonders if this is related to his illness.  ED Course: Per Dr. Freda Munro: Upon review patient's laboratory work I appreciate RVP is still pending at time of admission. I-STAT troponin undetectable, CBC reveals leukocytosis with white blood cell count of 24.8 and platelets of 144. Patient however has no evidence of coagulopathy or other bleeding diastases. BMP remarkable for sodium of 126 and chloride of 88 with creatinine 1.43 compared to patient's baseline creatinine of 1.2.   Patient's EKG was obtained and upon my independent review appreciate patient is in normal sinus without tachycardia and there is no evidence of acute STEMI or supple changes concerning for ischemia. Patient is no preexcitation or evidence of advanced heart block or bundle branch block. No hyperkalemia or evidence of arrhythmia.  Final Disposition Reassessment of the patient reveals patient is high risk for outpatient treatment of influenza due to significant interstitial lung disease and oxygen requirement at baseline. Due  to patient's desaturations during ambulation decision was made to admit the patient for further observation and treatment of influenza.  Review of Systems: As per HPI; otherwise 10 point review of systems reviewed and negative.   Ambulatory Status:  Ambulates short distances indepedently  Past Medical History:  Diagnosis Date  . Bradycardia   . Esophageal reflux   . Fluttering heart   . Hx of bladder cancer 08/26/2014   Claudia Desanctis, CCRP spoke with patient wife in the presence of PI, Dr. Chase Caller.  AFFERENT cough IPF study:  Wife narrated, "bladder Cancer in 2010...states, had a small spot removed, no additional treatment required and is in complete remission"   . IPF (idiopathic pulmonary fibrosis) ([place])   . Lung nodule   . Other and unspecified hyperlipidemia   . Palpitations   . Unspecified essential hypertension     Past Surgical History:  Procedure Laterality Date  . CARDIOVASCULAR STRESS TEST  07/29/2009   ef 64%  . CYSTOSCOPY     removal of bladder tumor  . FEMORAL HERNIA REPAIR  07/20/2011   Procedure: HERNIA REPAIR FEMORAL;  Surgeon: Adin Hector, MD;  Location: [facility];  Service: General;  Laterality: Left;  . INGUINAL HERNIA REPAIR  07/20/2011   Procedure: LAPAROSCOPIC BILATERAL INGUINAL HERNIA REPAIR;  Surgeon: Adin Hector, MD;  Location: [facility];  Service: General;  Laterality: Bilateral;  . US ECHOCARDIOGRAPHY  07/25/2009   ef 55-60%    Social History   Social History  . Marital status: Married    Spouse name: N/A  . Number of children: N/A  . Years of education: N/A  Occupational History  . Retired    Social History Main Topics  . Smoking status: Never Smoker  . Smokeless tobacco: Former Systems developer  . Alcohol use 1.2 oz/week    2 Cans of beer per week     Comment: occasional use  . Drug use: No  . Sexual activity: Not on file   Other Topics Concern  . Not on file   Social History Narrative  . No narrative on file    No Known Allergies  Family  History  Problem Relation Age of Onset  . COPD Mother   . Heart failure Father   . Coronary artery disease    . COPD    . Pulmonary fibrosis Brother     Prior to Admission medications   Medication Sig Start Date End Date Taking? Authorizing Provider  ALPRAZolam (XANAX) 0.25 MG tablet Take 1 tablet (0.25 mg total) by mouth daily as needed for anxiety. 07/19/14  Yes Brand Males, MD  aspirin 325 MG tablet Take 162.5 mg by mouth daily.   Yes Historical Provider, MD  atenolol (TENORMIN) 25 MG tablet Take 25 mg by mouth daily. 02/08/11  Yes Darlin Coco, MD  atorvastatin (LIPITOR) 80 MG tablet Take 80 mg by mouth daily.    Yes Historical Provider, MD  famotidine (PEPCID) 20 MG tablet 1 twice daily before meals Patient taking differently: Take 20 mg by mouth daily.  10/14/15  Yes Deneise Lever, MD  HYDROcodone-homatropine [facility]) 5-1.5 MG/5ML syrup Take 5 mLs by mouth every 6 (six) hours as needed for cough.   Yes Historical Provider, MD  KLOR-CON M10 10 MEQ tablet Take 1 tablet by mouth daily. 04/02/12  Yes Historical Provider, MD  morphine (MS CONTIN) 15 MG 12 hr tablet Take 15 mg by mouth at bedtime. 03/05/16 04/04/16 Yes Historical Provider, MD  omeprazole (PRILOSEC) 20 MG capsule Take 20 mg by mouth daily. 30 minutes before breakfast   Yes Historical Provider, MD  oseltamivir (TAMIFLU) 75 MG capsule Take 75 mg by mouth 2 (two) times daily. 03/05/16  Yes Historical Provider, MD  Pirfenidone 267 MG CAPS Take 3 tablets by mouth 3 (three) times daily. 09/01/13  Yes Historical Provider, MD  tamsulosin (FLOMAX) 0.4 MG CAPS capsule Take 0.4 mg by mouth daily.   Yes Historical Provider, MD  telmisartan-hydrochlorothiazide (MICARDIS HCT) 80-25 MG per tablet Take 1 tablet by mouth daily.   Yes Historical Provider, MD  Vitamins/Minerals TABS Take 1 tablet by mouth daily.   Yes Historical Provider, MD    Physical Exam: Vitals:   03/05/16 1931 03/05/16 2000 03/05/16 2100 03/05/16 2351  BP:   145/74 156/71 (!) 127/52  Pulse:  79 92 87  Resp:  (!) 40 (!) 43 (!) 24  Temp: 98.9 F (37.2 C)   (!) 100.8 F (38.2 C)  TempSrc: Oral   Oral  SpO2:  99% 97% 100%     General: Appears calm and comfortable and is NAD; upon talking, became dyspneic and developed a staccato and persistent cough Eyes:  PERRL, EOMI, normal lids, iris ENT:  grossly normal hearing, lips & tongue, mmm Neck:  no LAD, masses or thyromegaly Cardiovascular:  RRR, no m/r/g. No LE edema.  Respiratory:  Diffuse ronchi in bilateral lungs with diminished air flow. Increased respiratory effort. Abdomen:  soft, ntnd, NABS Skin:  no rash or induration seen on limited exam Musculoskeletal:  grossly normal tone BUE/BLE, good ROM, no bony abnormality Psychiatric:  grossly normal mood and affect, speech fluent and  appropriate, AOx3 Neurologic:  CN 2-12 grossly intact, moves all extremities in coordinated fashion, sensation intact  Labs on Admission: I have personally reviewed following labs and imaging studies  CBC:  Recent Labs Lab 03/05/16 1442  WBC 24.8*  HGB 14.8  HCT 42.3  MCV 85.5  PLT 123456*   Basic Metabolic Panel:  Recent Labs Lab 03/05/16 1442  NA 126*  K 4.0  CL 88*  CO2 30  GLUCOSE 134*  BUN 21*  CREATININE 1.43*  CALCIUM 8.8*   GFR: CrCl cannot be calculated (Unknown ideal weight.). Liver Function Tests: No results for input(s): AST, ALT, ALKPHOS, BILITOT, PROT, ALBUMIN in the last 168 hours. No results for input(s): LIPASE, AMYLASE in the last 168 hours. No results for input(s): AMMONIA in the last 168 hours. Coagulation Profile: No results for input(s): INR, PROTIME in the last 168 hours. Cardiac Enzymes: No results for input(s): CKTOTAL, CKMB, CKMBINDEX, TROPONINI in the last 168 hours. BNP (last 3 results) No results for input(s): PROBNP in the last 8760 hours. HbA1C: No results for input(s): HGBA1C in the last 72 hours. CBG: No results for input(s): GLUCAP in the last 168  hours. Lipid Profile: No results for input(s): CHOL, HDL, LDLCALC, TRIG, CHOLHDL, LDLDIRECT in the last 72 hours. Thyroid Function Tests: No results for input(s): TSH, T4TOTAL, FREET4, T3FREE, THYROIDAB in the last 72 hours. Anemia Panel: No results for input(s): VITAMINB12, FOLATE, FERRITIN, TIBC, IRON, RETICCTPCT in the last 72 hours. Urine analysis:    Component Value Date/Time   COLORURINE YELLOW 07/20/2011 2234   APPEARANCEUR CLOUDY (A) 07/20/2011 2234   LABSPEC 1.027 07/20/2011 2234   PHURINE 6.0 07/20/2011 2234   GLUCOSEU NEGATIVE 07/20/2011 2234   HGBUR NEGATIVE 07/20/2011 2234   BILIRUBINUR NEGATIVE 07/20/2011 2234   KETONESUR NEGATIVE 07/20/2011 2234   PROTEINUR NEGATIVE 07/20/2011 2234   UROBILINOGEN 0.2 07/20/2011 2234   NITRITE NEGATIVE 07/20/2011 2234   LEUKOCYTESUR NEGATIVE 07/20/2011 2234    Creatinine Clearance: CrCl cannot be calculated (Unknown ideal weight.).  Sepsis Labs: @LABRCNTIP (procalcitonin:4,lacticidven:4) )No results found for this or any previous visit (from the past 240 hour(s)).   Radiological Exams on Admission: Dg Chest 2 View  Result Date: 03/05/2016 CLINICAL DATA:  Increase shortness of breath over the past 3 days, positive feet test today EXAM: CHEST  2 VIEW COMPARISON:  07/28/2015 FINDINGS: Cardiac shadow is stable. Diffuse fibrotic changes are again identified bilaterally. No focal confluent infiltrate or sizable effusion is seen. No bony abnormality is noted. IMPRESSION: Chronic fibrotic changes bilaterally without acute abnormality. Electronically Signed   By: Inez Catalina M.D.   On: 03/05/2016 15:16    EKG: Independently reviewed.  NSR with rate 73; normal EKG   Assessment/Plan Principal Problem:   Influenza Active Problems:   Hyperlipidemia   Essential hypertension   IPF (idiopathic pulmonary fibrosis) (HCC)   Hyponatremia   CKD (chronic kidney disease) stage 3, GFR 30-59 ml/min   Thrombocytopenia (HCC)   Acute on chronic  respiratory failure (HCC)   Chronic pain syndrome   Influenza -Patient reportedly positive for influenza at outside facility today -Has typical flu symptoms -Will initiate Tamiflu for 5 days -Supportive care  Pulmonary fibrosis, acute on chronic respiratory failure -patient with severe underlying ILD which places him at marked increased risk -Uses 4-5L [place] O2 as baseline, currently requiring 6L -With increased O2 requirement, active influenza infection, abnormal lung exam (which may be chronic for him, but which is concerning) will treat empirically for CAP -Given his underlying severe  lung disease, he needs empiric treatment for Pseudomonas -Pseudomonas lung infections require double coverage for empiric therapy -Patient started on Zosyn and Levaquin (750mg ), but pharmacy requested change to Zosyn and Cipro -Patient with significant risk for decompensation, discussed possible need for intubation - patient is clear that he would NOT be willing to be intubated; he is receptive to non-invasive forms of ventilation such as BIPAP if needed  Hyponatremia -Moderate -Likely hypovolemic hyponatremia -Will give NS at 125 cc/hr for now and continue to follow -It is not currently low enough to place him at significant risk for central pontine myelinolysis with rapid correction  Thrombocytopenia -Platelets are currently 144 -Will need to monitor -If <100, will need to stop Lovenox  HTN -Continue Atenolol -Hold Micardis HCT due to CKD and hyponatremia  HLD -Continue Lipitor 80 mg  CKD -Appears to be at/near baseline -Will follow with rehydration  Chronic pain -I have reviewed this patient in the [place] Controlled Substances Reporting System.  He is receiving his medications from only one provider and appears to be taking them as prescribed.  DVT prophylaxis: Lovenox  Code Status: DNR - confirmed with patient/family Family Communication: Son present throughout  Disposition Plan:  Home once  clinically improved Consults called: None Admission status: Admit - It is my clinical opinion that admission to INPATIENT is reasonable and necessary because this patient will require at least 2 midnights in the hospital to treat this condition based on the medical complexity of the problems presented.  Given the aforementioned information, the predictability of an adverse outcome is felt to be significant.    Karmen Bongo MD [facility]  If 7PM-7AM, please contact night-coverage [email or web address] Password [email or web address]  03/06/2016, 1:09 AM

## 2016-03-06 ENCOUNTER — Encounter (HOSPITAL_COMMUNITY): Admission: RE | Admit: 2016-03-06 | Payer: Self-pay | Source: Ambulatory Visit

## 2016-03-06 DIAGNOSIS — G894 Chronic pain syndrome: Secondary | ICD-10-CM | POA: Diagnosis present

## 2016-03-06 LAB — RESPIRATORY PANEL BY PCR
Adenovirus: NOT DETECTED
BORDETELLA PERTUSSIS-RVPCR: NOT DETECTED
CORONAVIRUS 229E-RVPPCR: NOT DETECTED
Chlamydophila pneumoniae: NOT DETECTED
Coronavirus HKU1: NOT DETECTED
Coronavirus NL63: NOT DETECTED
Coronavirus OC43: NOT DETECTED
INFLUENZA A-RVPPCR: NOT DETECTED
INFLUENZA B-RVPPCR: NOT DETECTED
METAPNEUMOVIRUS-RVPPCR: NOT DETECTED
Mycoplasma pneumoniae: NOT DETECTED
PARAINFLUENZA VIRUS 2-RVPPCR: NOT DETECTED
PARAINFLUENZA VIRUS 3-RVPPCR: NOT DETECTED
Parainfluenza Virus 1: NOT DETECTED
Parainfluenza Virus 4: NOT DETECTED
RESPIRATORY SYNCYTIAL VIRUS-RVPPCR: NOT DETECTED
RHINOVIRUS / ENTEROVIRUS - RVPPCR: NOT DETECTED

## 2016-03-06 LAB — BASIC METABOLIC PANEL
ANION GAP: 6 (ref 5–15)
BUN: 16 mg/dL (ref 6–20)
CO2: 28 mmol/L (ref 22–32)
Calcium: 8 mg/dL — ABNORMAL LOW (ref 8.9–10.3)
Chloride: 93 mmol/L — ABNORMAL LOW (ref 101–111)
Creatinine, Ser: 1.34 mg/dL — ABNORMAL HIGH (ref 0.61–1.24)
GFR, EST AFRICAN AMERICAN: 56 mL/min — AB (ref >60)
GFR, EST NON AFRICAN AMERICAN: 49 mL/min — AB (ref >60)
Glucose, Bld: 126 mg/dL — ABNORMAL HIGH (ref 65–99)
POTASSIUM: 3.8 mmol/L (ref 3.5–5.1)
SODIUM: 127 mmol/L — AB (ref 135–145)

## 2016-03-06 LAB — CBC
HEMATOCRIT: 32.8 % — AB (ref 39.0–52.0)
Hemoglobin: 11.5 g/dL — ABNORMAL LOW (ref 13.0–17.0)
MCH: 29.4 pg (ref 26.0–34.0)
MCHC: 35.1 g/dL (ref 30.0–36.0)
MCV: 83.9 fL (ref 78.0–100.0)
Platelets: 120 10*3/uL — ABNORMAL LOW (ref 150–400)
RBC: 3.91 MIL/uL — AB (ref 4.22–5.81)
RDW: 13 % (ref 11.5–15.5)
WBC: 20 10*3/uL — AB (ref 4.0–10.5)

## 2016-03-06 MED ORDER — ATORVASTATIN CALCIUM 80 MG PO TABS
80.0000 mg | ORAL_TABLET | Freq: Every day | ORAL | Status: DC
  Administered 2016-03-07: 80 mg via ORAL
  Filled 2016-03-06: qty 1

## 2016-03-06 MED ORDER — IPRATROPIUM-ALBUTEROL 0.5-2.5 (3) MG/3ML IN SOLN: 3.0000 mL | RESPIRATORY_TRACT | Status: DC | PRN

## 2016-03-06 MED ORDER — OSELTAMIVIR PHOSPHATE 30 MG PO CAPS
30.0000 mg | ORAL_CAPSULE | Freq: Two times a day (BID) | ORAL | Status: DC
  Administered 2016-03-06 – 2016-03-08 (×5): 30 mg via ORAL
  Filled 2016-03-06 (×6): qty 1

## 2016-03-06 MED ORDER — SODIUM CHLORIDE 0.9 % IV SOLN
INTRAVENOUS | Status: DC
  Administered 2016-03-06 – 2016-03-08 (×4): via INTRAVENOUS

## 2016-03-06 MED ORDER — PIPERACILLIN-TAZOBACTAM 3.375 G IVPB
3.3750 g | Freq: Three times a day (TID) | INTRAVENOUS | Status: DC
Start: 2016-03-06 — End: 2016-03-07
  Administered 2016-03-06 – 2016-03-07 (×2): 3.375 g via INTRAVENOUS
  Filled 2016-03-06 (×5): qty 50

## 2016-03-06 MED ORDER — ENOXAPARIN SODIUM 40 MG/0.4ML ~~LOC~~ SOLN
40.0000 mg | Freq: Every day | SUBCUTANEOUS | Status: DC
  Administered 2016-03-06 – 2016-03-07 (×2): 40 mg via SUBCUTANEOUS
  Filled 2016-03-06 (×2): qty 0.4

## 2016-03-06 MED ORDER — GUAIFENESIN-DM 100-10 MG/5ML PO SYRP: 5.0000 mL | ORAL_SOLUTION | ORAL | Status: DC | PRN

## 2016-03-06 NOTE — Progress Notes (Signed)
Patient ID: [name] [name], male   DOB: [birth year], 81 y.o.   MRN: [id number]    PROGRESS NOTE    KELTON BULTMAN  GYI:[id number] DOB: 01-29-1935 DOA: 03/05/2016  PCP: [name], MD   Brief Narrative:  81 y.o. male with medical history significant of severe idiopathic pulmonary fibrosis with chronic respiratory failure, on 4-5L home O2; HTN; h/o bladder CA, underwent cystoscopy on 03/02/16 ; and GERD presented with worsening SOB.  Patient reported he has been sick since Saturday AM - chills, aching all over, nauseated.  Symptoms continued Sunday.  Called PCP on am of the admission and made an appointment.  Flu positive.   Assessment & Plan:   Sepsis secondary to Influenza - please note that pt has met criteria for sepsis with T 100.2F, HR > 90, RR as high as 40's, WBC 24.8, source Influenza  - started on Tamiflu and today is day #2 - for empiric coverage of Pseudomonas, pt was started on Zosyn and Cipro and will continue same ABX for now as well day #2 - repeat CBC in AM - monitor vital signs closely   Pulmonary fibrosis, acute on chronic respiratory failure - patient with severe underlying ILD which places him at marked increased risk - Uses 4-5L [place] O2 as baseline, currently requiring 6L - With increased O2 requirement, active influenza infection, pt also treated for presumptive CAP - Given his underlying severe lung disease, it was thought that pt needs empiric treatment for Pseudomonas - Pseudomonas lung infections require double coverage for empiric therapy - Patient started on Zosyn and Cipro, continue day #2 - Patient with significant risk for decompensation, discussed possible need for intubation - patient is clear that he would NOT be willing to be intubated; he is receptive to non-invasive forms of ventilation such as BIPAP if needed  Hyponatremia -Moderate -Likely hypovolemic hyponatremia -Will give NS at 125 cc/hr for now and continue to follow -It is not  currently low enough to place him at significant risk for central pontine myelinolysis with rapid correction  Thrombocytopenia - Platelets down from 144 --> 120 - Will need to monitor - If <100, will need to stop Lovenox  HTN, essential  - Continue Atenolol - Hold Micardis HCT due to CKD and hyponatremia  HLD - Continue Lipitor 80 mg  CKD, stage III - Appears to be at/near baseline - BMP in AM  Chronic pain - Dr. Lorin Mercy reviewed patient's in the [place] Controlled Substances Reporting System.   - He is receiving his medications from only one provider and appears to be taking them as prescribed.  DVT prophylaxis: Lovenox SQ Code Status: DNR - Family Communication: wife present throughout  Disposition Plan:  Home once clinically improved  Consultants:   None  Procedures:   None  Antimicrobials:   Zosyn 10/16 -->  Cipro 10/16 -->  tamiflu 10/16 -->   Subjective: Still feels weeks and sick.  Objective: Vitals:   03/05/16 2100 03/05/16 2351 03/06/16 0459 03/06/16 1300  BP: 156/71 (!) 127/52 (!) 108/52 138/68  Pulse: 92 87 77 82  Resp: (!) 43 (!) 24 20 (!) 21  Temp:  (!) 100.8 F (38.2 C) 98.6 F (37 C) 97.8 F (36.6 C)  TempSrc:  Oral Oral Oral  SpO2: 97% 100% 97% 98%  Weight:   74.5 kg (164 lb 4.8 oz)   Height:   '5\' 7"'$  (1.702 m)     Intake/Output Summary (Last 24 hours) at 03/06/16 1929 Last data filed at 03/06/16  1855  Gross per 24 hour  Intake          3320.92 ml  Output             2875 ml  Net           445.92 ml   Filed Weights   03/06/16 0459  Weight: 74.5 kg (164 lb 4.8 oz)    Examination:  General exam: Appears calm and comfortable  Respiratory system: diminished breath sounds at bases with rhonchi, visible distress with coughing spells, somewhat dry cough  Cardiovascular system: S1 & S2 heard, RRR. No JVD, murmurs, rubs, gallops or clicks. No pedal edema. Gastrointestinal system: Abdomen is nondistended, soft and nontender. No  organomegaly or masses felt.  Central nervous system: Alert and oriented. No focal neurological deficits. Extremities: Symmetric 5 x 5 power.  Data Reviewed: I have personally reviewed following labs and imaging studies  CBC:  Recent Labs Lab 03/05/16 1442 03/06/16 0309  WBC 24.8* 20.0*  HGB 14.8 11.5*  HCT 42.3 32.8*  MCV 85.5 83.9  PLT 144* 341*   Basic Metabolic Panel:  Recent Labs Lab 03/05/16 1442 03/06/16 0309  NA 126* 127*  K 4.0 3.8  CL 88* 93*  CO2 30 28  GLUCOSE 134* 126*  BUN 21* 16  CREATININE 1.43* 1.34*  CALCIUM 8.8* 8.0*   Urine analysis:    Component Value Date/Time   COLORURINE YELLOW 07/20/2011 2234   APPEARANCEUR CLOUDY (A) 07/20/2011 2234   LABSPEC 1.027 07/20/2011 2234   PHURINE 6.0 07/20/2011 2234   GLUCOSEU NEGATIVE 07/20/2011 2234   HGBUR NEGATIVE 07/20/2011 2234   BILIRUBINUR NEGATIVE 07/20/2011 2234   KETONESUR NEGATIVE 07/20/2011 2234   PROTEINUR NEGATIVE 07/20/2011 2234   UROBILINOGEN 0.2 07/20/2011 2234   NITRITE NEGATIVE 07/20/2011 2234   LEUKOCYTESUR NEGATIVE 07/20/2011 2234   Recent Results (from the past 240 hour(s))  Respiratory Panel by PCR     Status: None   Collection Time: 03/06/16  6:14 AM  Result Value Ref Range Status   Adenovirus NOT DETECTED NOT DETECTED Final   Coronavirus 229E NOT DETECTED NOT DETECTED Final   Coronavirus HKU1 NOT DETECTED NOT DETECTED Final   Coronavirus NL63 NOT DETECTED NOT DETECTED Final   Coronavirus OC43 NOT DETECTED NOT DETECTED Final   Metapneumovirus NOT DETECTED NOT DETECTED Final   Rhinovirus / Enterovirus NOT DETECTED NOT DETECTED Final   Influenza A NOT DETECTED NOT DETECTED Final   Influenza B NOT DETECTED NOT DETECTED Final   Parainfluenza Virus 1 NOT DETECTED NOT DETECTED Final   Parainfluenza Virus 2 NOT DETECTED NOT DETECTED Final   Parainfluenza Virus 3 NOT DETECTED NOT DETECTED Final   Parainfluenza Virus 4 NOT DETECTED NOT DETECTED Final   Respiratory Syncytial Virus  NOT DETECTED NOT DETECTED Final   Bordetella pertussis NOT DETECTED NOT DETECTED Final   Chlamydophila pneumoniae NOT DETECTED NOT DETECTED Final   Mycoplasma pneumoniae NOT DETECTED NOT DETECTED Final      Radiology Studies: Dg Chest 2 View  Result Date: 03/05/2016 CLINICAL DATA:  Increase shortness of breath over the past 3 days, positive feet test today EXAM: CHEST  2 VIEW COMPARISON:  07/28/2015 FINDINGS: Cardiac shadow is stable. Diffuse fibrotic changes are again identified bilaterally. No focal confluent infiltrate or sizable effusion is seen. No bony abnormality is noted. IMPRESSION: Chronic fibrotic changes bilaterally without acute abnormality. Electronically Signed   By: Inez Catalina M.D.   On: 03/05/2016 15:16      Scheduled Meds: .  aspirin  162.5 mg Oral Daily  . atenolol  25 mg Oral Daily  . [START ON 03/07/2016] atorvastatin  80 mg Oral Daily  . ciprofloxacin  400 mg Intravenous Q12H  . docusate sodium  100 mg Oral BID  . enoxaparin (LOVENOX) injection  40 mg Subcutaneous QHS  . famotidine  20 mg Oral BID AC  . morphine  15 mg Oral QHS  . oseltamivir  30 mg Oral BID  . pantoprazole  40 mg Oral Daily  . piperacillin-tazobactam (ZOSYN)  IV  3.375 g Intravenous Q8H  . Pirfenidone  534 mg Oral Q supper  . Pirfenidone  801 mg Oral BID WC  . sodium chloride flush  3 mL Intravenous Q12H  . tamsulosin  0.4 mg Oral Daily   Continuous Infusions: . sodium chloride 125 mL/hr at 03/06/16 1730     LOS: 1 day    Time spent: 20 minutes    Faye Ramsay, MD [facility] Pager [phone]  If 7PM-7AM, please contact night-coverage [email or web address] Password [email or web address] 03/06/2016, 7:29 PM

## 2016-03-07 DIAGNOSIS — N183 Chronic kidney disease, stage 3 unspecified: Secondary | ICD-10-CM

## 2016-03-07 DIAGNOSIS — N179 Acute kidney failure, unspecified: Secondary | ICD-10-CM

## 2016-03-07 DIAGNOSIS — J84112 Idiopathic pulmonary fibrosis: Secondary | ICD-10-CM

## 2016-03-07 DIAGNOSIS — E785 Hyperlipidemia, unspecified: Secondary | ICD-10-CM

## 2016-03-07 DIAGNOSIS — J9621 Acute and chronic respiratory failure with hypoxia: Secondary | ICD-10-CM

## 2016-03-07 DIAGNOSIS — G894 Chronic pain syndrome: Secondary | ICD-10-CM

## 2016-03-07 DIAGNOSIS — I10 Essential (primary) hypertension: Secondary | ICD-10-CM

## 2016-03-07 DIAGNOSIS — E871 Hypo-osmolality and hyponatremia: Secondary | ICD-10-CM

## 2016-03-07 LAB — CBC
HEMATOCRIT: 33.4 % — AB (ref 39.0–52.0)
Hemoglobin: 11.7 g/dL — ABNORMAL LOW (ref 13.0–17.0)
MCH: 29.7 pg (ref 26.0–34.0)
MCHC: 35 g/dL (ref 30.0–36.0)
MCV: 84.8 fL (ref 78.0–100.0)
PLATELETS: 131 10*3/uL — AB (ref 150–400)
RBC: 3.94 MIL/uL — ABNORMAL LOW (ref 4.22–5.81)
RDW: 13 % (ref 11.5–15.5)
WBC: 10.1 10*3/uL (ref 4.0–10.5)

## 2016-03-07 LAB — BASIC METABOLIC PANEL
ANION GAP: 7 (ref 5–15)
BUN: 11 mg/dL (ref 6–20)
CALCIUM: 8.2 mg/dL — AB (ref 8.9–10.3)
CO2: 25 mmol/L (ref 22–32)
Chloride: 100 mmol/L — ABNORMAL LOW (ref 101–111)
Creatinine, Ser: 1.12 mg/dL (ref 0.61–1.24)
Glucose, Bld: 95 mg/dL (ref 65–99)
Potassium: 3.7 mmol/L (ref 3.5–5.1)
Sodium: 132 mmol/L — ABNORMAL LOW (ref 135–145)

## 2016-03-07 MED ORDER — ENSURE ENLIVE PO LIQD
237.0000 mL | Freq: Two times a day (BID) | ORAL | Status: DC
  Administered 2016-03-08: 237 mL via ORAL

## 2016-03-07 MED ORDER — LEVOFLOXACIN IN D5W 750 MG/150ML IV SOLN
750.0000 mg | INTRAVENOUS | Status: DC
  Administered 2016-03-07: 750 mg via INTRAVENOUS
  Filled 2016-03-07: qty 150

## 2016-03-07 NOTE — Progress Notes (Signed)
Patient ID: [name] [name], male   DOB: 04/02/1935, 81 y.o.   MRN: [id number]    PROGRESS NOTE    [name] [name]  [id number]:[id number] DOB: August 19, 1934 DOA: 03/05/2016  PCP: [name], MD   Brief Narrative:  81 y.o. male with medical history significant of severe idiopathic pulmonary fibrosis with chronic respiratory failure, on 4-5L home O2; HTN; h/o bladder CA, underwent cystoscopy on 03/02/16 ; and GERD presented with worsening SOB.  Patient reported he has been sick since Saturday AM - chills, aching all over, nauseated.  Symptoms continued Sunday.  Called PCP on am of the admission and made an appointment.  Flu positive.   Assessment & Plan:   Sepsis secondary to Influenza - please note that pt has met criteria for sepsis with T 100.53F, HR > 90, RR as high as 40's, WBC 24.8, source Influenza  - started on Tamiflu and today is day #3 out of 5 - for empiric coverage of Pseudomonas, pt was started on Zosyn and Cipro now after day #3 will switch to PO levaquin  - repeat CBC with normal WBC's now; patient is afebrile and overall improving  - monitor vital signs closely  -will add flutter valve -home in 1-2 days  Pulmonary fibrosis, acute on chronic respiratory failure - patient with severe underlying ILD which places him at marked increased risk for infections - Uses 4-5L [place] O2 as baseline, currently requiring 6L - With increased O2 requirement, active influenza infection, pt also treated for presumptive CAP - Given his underlying severe lung disease, it was thought that pt needs empiric treatment for Pseudomonas - Patient with significant risk for decompensation, discussed possible need for intubation - patient is clear that he would NOT be willing to be intubated; he is receptive to non-invasive forms of ventilation such as BIPAP if needed -will switch to levaquin PO and stop cipro/zosyn   Hyponatremia -Moderate -Likely hypovolemic hyponatremia -Will give NS at 75cc/hr  now; patient advise to improve oral hydration and nutrition -Na 132 currently; will monitor   Thrombocytopenia - Platelets down from 144 --> 120 --> 131 - Will need to monitor - If <100, will need to stop Lovenox  HTN, essential  - Continue Atenolol - Hold Micardis HCT due to CKD and hyponatremia  HLD - Continue Lipitor 80 mg  Acute on chronic CKD, stage III at baseline - Appears to be at/near baseline - BMP in AM to follow trend -pre-renal in nature -will continue holding micardis   Chronic pain - Dr. Lorin Mercy reviewed patient's in the [place] Controlled Substances Reporting System.   - He is receiving his medications from only one provider and appears to be taking them as prescribed. -will continue home pain medication regimen  DVT prophylaxis: Lovenox SQ Code Status: DNR - Family Communication: wife present throughout  Disposition Plan:  Home once clinically improved  Consultants:   None  Procedures:   None  Antimicrobials:   Zosyn 10/16 -->10/18  Cipro 10/16 -->10/18  tamiflu 10/16 -->  levaquin 10/18   Subjective: Still feels weak, easily weaned with activity. denies CP. Having now productive cough. Patient is afebrile.  Objective: Vitals:   03/06/16 1300 03/06/16 2120 03/07/16 0525 03/07/16 1209  BP: 138/68 (!) 141/59 (!) 130/49 115/65  Pulse: 82 65 63 65  Resp: (!) '21 20 20 20  '$ Temp: 97.8 F (36.6 C) 98.7 F (37.1 C) 97.7 F (36.5 C) 97.7 F (36.5 C)  TempSrc: Oral Oral Axillary Oral  SpO2: 98% 100% 100%  100%  Weight:   75 kg (165 lb 5.5 oz)   Height:        Intake/Output Summary (Last 24 hours) at 03/07/16 1817 Last data filed at 03/07/16 1533  Gross per 24 hour  Intake             2155 ml  Output             1225 ml  Net              930 ml   Filed Weights   03/06/16 0459 03/07/16 0525  Weight: 74.5 kg (164 lb 4.8 oz) 75 kg (165 lb 5.5 oz)    Examination:  General exam: Appears calm and is afebrile; still feeling weak and with  SOB. [place] 6L Respiratory system: diminished breath sounds at bases with rhonchi, productive cough now Cardiovascular system: S1 & S2 heard, RRR. No JVD, murmurs, rubs, gallops or clicks. No pedal edema. Gastrointestinal system: Abdomen is nondistended, soft and nontender. No organomegaly or masses felt.  Central nervous system: Alert and oriented. No focal neurological deficits. Extremities: Symmetric 5 x 5 power.  Data Reviewed: I have personally reviewed following labs and imaging studies  CBC:  Recent Labs Lab 03/05/16 1442 03/06/16 0309 03/07/16 0526  WBC 24.8* 20.0* 10.1  HGB 14.8 11.5* 11.7*  HCT 42.3 32.8* 33.4*  MCV 85.5 83.9 84.8  PLT 144* 120* 151*   Basic Metabolic Panel:  Recent Labs Lab 03/05/16 1442 03/06/16 0309 03/07/16 0526  NA 126* 127* 132*  K 4.0 3.8 3.7  CL 88* 93* 100*  CO2 '30 28 25  '$ GLUCOSE 134* 126* 95  BUN 21* 16 11  CREATININE 1.43* 1.34* 1.12  CALCIUM 8.8* 8.0* 8.2*   Urine analysis:    Component Value Date/Time   COLORURINE YELLOW 07/20/2011 2234   APPEARANCEUR CLOUDY (A) 07/20/2011 2234   LABSPEC 1.027 07/20/2011 2234   PHURINE 6.0 07/20/2011 2234   GLUCOSEU NEGATIVE 07/20/2011 2234   HGBUR NEGATIVE 07/20/2011 2234   BILIRUBINUR NEGATIVE 07/20/2011 2234   KETONESUR NEGATIVE 07/20/2011 2234   PROTEINUR NEGATIVE 07/20/2011 2234   UROBILINOGEN 0.2 07/20/2011 2234   NITRITE NEGATIVE 07/20/2011 2234   LEUKOCYTESUR NEGATIVE 07/20/2011 2234   Recent Results (from the past 240 hour(s))  Respiratory Panel by PCR     Status: None   Collection Time: 03/06/16  6:14 AM  Result Value Ref Range Status   Adenovirus NOT DETECTED NOT DETECTED Final   Coronavirus 229E NOT DETECTED NOT DETECTED Final   Coronavirus HKU1 NOT DETECTED NOT DETECTED Final   Coronavirus NL63 NOT DETECTED NOT DETECTED Final   Coronavirus OC43 NOT DETECTED NOT DETECTED Final   Metapneumovirus NOT DETECTED NOT DETECTED Final   Rhinovirus / Enterovirus NOT DETECTED NOT  DETECTED Final   Influenza A NOT DETECTED NOT DETECTED Final   Influenza B NOT DETECTED NOT DETECTED Final   Parainfluenza Virus 1 NOT DETECTED NOT DETECTED Final   Parainfluenza Virus 2 NOT DETECTED NOT DETECTED Final   Parainfluenza Virus 3 NOT DETECTED NOT DETECTED Final   Parainfluenza Virus 4 NOT DETECTED NOT DETECTED Final   Respiratory Syncytial Virus NOT DETECTED NOT DETECTED Final   Bordetella pertussis NOT DETECTED NOT DETECTED Final   Chlamydophila pneumoniae NOT DETECTED NOT DETECTED Final   Mycoplasma pneumoniae NOT DETECTED NOT DETECTED Final      Radiology Studies: No results found.    Scheduled Meds: . aspirin  162.5 mg Oral Daily  . atenolol  25 mg  Oral Daily  . atorvastatin  80 mg Oral Daily  . docusate sodium  100 mg Oral BID  . enoxaparin (LOVENOX) injection  40 mg Subcutaneous QHS  . famotidine  20 mg Oral BID AC  . levofloxacin (LEVAQUIN) IV  750 mg Intravenous Q24H  . morphine  15 mg Oral QHS  . oseltamivir  30 mg Oral BID  . pantoprazole  40 mg Oral Daily  . Pirfenidone  534 mg Oral Q supper  . Pirfenidone  801 mg Oral BID WC  . sodium chloride flush  3 mL Intravenous Q12H  . tamsulosin  0.4 mg Oral Daily   Continuous Infusions: . sodium chloride 75 mL/hr at 03/06/16 2125     LOS: 2 days    Time spent: 25 minutes    Barton Dubois, MD [facility] Pager [phone]  If 7PM-7AM, please contact night-coverage [email or web address] Password [facility] 03/07/2016, 6:17 PM

## 2016-03-07 NOTE — Evaluation (Signed)
Physical Therapy Evaluation Patient Details Name: [name] [name] MRN: [id number] DOB: [birth year] Today's Date: 03/07/2016   History of Present Illness  81 y.o. male with medical history significant of severe idiopathic pulmonary fibrosis with chronic respiratory failure, on 4-5L home O2; HTN; h/o bladder CA, underwent cystoscopy on 03/02/16 ; and GERD presented with worsening SOB. Positive flu  Clinical Impression  Pt admitted with above diagnosis. Pt currently with functional limitations due to the deficits listed below (see PT Problem List). Pt ambulated 120' with RW and 2 standing rest breaks  And 6L O2 with O2 desat to 77% by end of ambulation, 5 mins to recover to 90's. Will follow acutely but then pt expected to return to pulmonary rehab.  Pt will benefit from skilled PT to increase their independence and safety with mobility to allow discharge to the venue listed below.       Follow Up Recommendations No PT follow up, return to pulmonary rehab    Equipment Recommendations  None recommended by PT    Recommendations for Other Services       Precautions / Restrictions Precautions Precautions: Other (comment) Precaution Comments: watch O2 sats Restrictions Weight Bearing Restrictions: No      Mobility  Bed Mobility               General bed mobility comments: pt received sitting EOB  Transfers Overall transfer level: Needs assistance Equipment used: Rolling walker (2 wheeled) Transfers: Sit to/from Stand Sit to Stand: Supervision         General transfer comment: vc's for hand position, supervision for safety as pt has not been up much since here  Ambulation/Gait Ambulation/Gait assistance: Min assist Ambulation Distance (Feet): 120 Feet Assistive device: Rolling walker (2 wheeled) Gait Pattern/deviations: Step-through pattern;Decreased stride length Gait velocity: decreased Gait velocity interpretation: <1.8 ft/sec, indicative of risk for recurrent  falls General Gait Details: pt ambulates without AD at home, but used RW due to weakness and fatigue today, heavy reliance on RW. 2 standing rest breaks taken, 2/4 DOE with increased WOB. O2 sats 86% at halfway, down to 77% at end of ambulation on 6L. Took 5 min seated recovery to return to low 90's.   Stairs            Wheelchair Mobility    Modified Rankin (Stroke Patients Only)       Balance Overall balance assessment: Needs assistance Sitting-balance support: No upper extremity supported Sitting balance-Leahy Scale: Good     Standing balance support: No upper extremity supported Standing balance-Leahy Scale: Fair Standing balance comment: currently compromised due to fatigue and respiratory status                             Pertinent Vitals/Pain Pain Assessment: No/denies pain    Home Living Family/patient expects to be discharged to:: Private residence Living Arrangements: Spouse/significant other Available Help at Discharge: Family;Available 24 hours/day Type of Home: House Home Access: Stairs to enter Entrance Stairs-Rails: Right Entrance Stairs-Number of Steps: 3 Home Layout: One level Home Equipment: Walker - 2 wheels;Cane - single point;Wheelchair - manual      Prior Function Level of Independence: Independent         Comments: pt stays on 4L O2, occasionally increased with exertion. But before hospitalization was independent, had been at an [employer] game a few weeks ago. Drives     Hand Dominance  Extremity/Trunk Assessment   Upper Extremity Assessment: Generalized weakness (arms trembling with use of RW due to fatigue)           Lower Extremity Assessment: Generalized weakness      Cervical / Trunk Assessment: Kyphotic  Communication   Communication: No difficulties  Cognition Arousal/Alertness: Awake/alert Behavior During Therapy: WFL for tasks assessed/performed Overall Cognitive Status: Within  Functional Limits for tasks assessed                      General Comments      Exercises     Assessment/Plan    PT Assessment Patient needs continued PT services  PT Problem List Decreased strength;Decreased activity tolerance;Decreased balance;Decreased mobility;Cardiopulmonary status limiting activity          PT Treatment Interventions DME instruction;Gait training;Stair training;Functional mobility training;Therapeutic activities;Therapeutic exercise;Balance training;Patient/family education    PT Goals (Current goals can be found in the Care Plan section)  Acute Rehab PT Goals Patient Stated Goal: return home, feel better PT Goal Formulation: With patient Time For Goal Achievement: 03/21/16 Potential to Achieve Goals: Good    Frequency Min 3X/week   Barriers to discharge Inaccessible home environment 3 STE    Co-evaluation               End of Session Equipment Utilized During Treatment: Gait belt;Oxygen Activity Tolerance: Patient limited by fatigue Patient left: in bed;with call bell/phone within reach;with family/visitor present Nurse Communication: Mobility status         Time: 1004-1030 PT Time Calculation (min) (ACUTE ONLY): 26 min   Charges:   PT Evaluation $PT Eval Moderate Complexity: 1 Procedure PT Treatments $Gait Training: 8-22 mins   PT G Codes:       Leighton Roach, PT  Acute Rehab Services  [place], [place] 03/07/2016, 11:20 AM

## 2016-03-08 ENCOUNTER — Encounter (HOSPITAL_COMMUNITY): Payer: Self-pay

## 2016-03-08 DIAGNOSIS — R0902 Hypoxemia: Secondary | ICD-10-CM

## 2016-03-08 DIAGNOSIS — D696 Thrombocytopenia, unspecified: Secondary | ICD-10-CM

## 2016-03-08 DIAGNOSIS — J9620 Acute and chronic respiratory failure, unspecified whether with hypoxia or hypercapnia: Secondary | ICD-10-CM

## 2016-03-08 DIAGNOSIS — A419 Sepsis, unspecified organism: Principal | ICD-10-CM

## 2016-03-08 DIAGNOSIS — J189 Pneumonia, unspecified organism: Secondary | ICD-10-CM

## 2016-03-08 MED ORDER — GUAIFENESIN ER 600 MG PO TB12: 600.0000 mg | ORAL_TABLET | Freq: Two times a day (BID) | ORAL | 0 refills | Status: DC

## 2016-03-08 MED ORDER — PIRFENIDONE 267 MG PO CAPS: 534.0000 mg | ORAL_CAPSULE | Freq: Every day | ORAL | Status: DC

## 2016-03-08 MED ORDER — LEVOFLOXACIN 750 MG PO TABS
750.0000 mg | ORAL_TABLET | Freq: Every day | ORAL | Status: DC
  Administered 2016-03-08: 750 mg via ORAL
  Filled 2016-03-08: qty 1

## 2016-03-08 MED ORDER — OSELTAMIVIR PHOSPHATE 75 MG PO CAPS: 75.0000 mg | ORAL_CAPSULE | Freq: Two times a day (BID) | ORAL | 0 refills | Status: DC

## 2016-03-08 MED ORDER — LEVOFLOXACIN 750 MG PO TABS: 750.0000 mg | ORAL_TABLET | Freq: Every day | ORAL | 0 refills | Status: DC

## 2016-03-08 MED ORDER — ENSURE ENLIVE PO LIQD: 237.0000 mL | Freq: Two times a day (BID) | ORAL | 12 refills | Status: AC

## 2016-03-08 NOTE — Progress Notes (Signed)
Pt has orders to be discharged. Discharge instructions given and pt has no additional questions at this time. Medication regimen reviewed and pt educated. Pt verbalized understanding and has no additional questions. Telemetry box removed. IV removed and site in good condition. Respiratory therapist delivered flutter valve to pt's room and educated pt. Pt stable and escorted off floor via wheelchair with home oxygen tank.

## 2016-03-08 NOTE — Discharge Summary (Signed)
Physician Discharge Summary  [name] [name] OAC:[id number] DOB: 1934-12-24 DOA: 03/05/2016  PCP: [name], MD  Admit date: 03/05/2016 Discharge date: 03/08/2016  Time spent: 35 minutes  Recommendations for Outpatient Follow-up:  1. Repeat BMET to follow up with electrolytes and renal function  2. Repeat CBC to follow up platelets and WBC's   Discharge Diagnoses:  Principal Problem:   Influenza Active Problems:   Hyperlipidemia   Essential hypertension   IPF (idiopathic pulmonary fibrosis) (HCC)   Hyponatremia   CKD (chronic kidney disease) stage 3, GFR 30-59 ml/min   Thrombocytopenia (HCC)   Acute on chronic respiratory failure (HCC)   Chronic pain syndrome   Acute renal failure superimposed on stage 3 chronic kidney disease ([place])   Community acquired pneumonia   Discharge Condition: stable and improved. Discharge home with instructions to follow up with PCP in 1 week and with pulmonary service in 2 weeks.  Diet recommendation: heart healthy diet   Filed Weights   03/06/16 0459 03/07/16 0525 03/08/16 0500  Weight: 74.5 kg (164 lb 4.8 oz) 75 kg (165 lb 5.5 oz) 75.2 kg (165 lb 12.6 oz)    History of present illness:  As per Dr. Lorin Mercy H&P written on 03/05/16 81 y.o. male with medical history significant of severe idiopathic pulmonary fibrosis with chronic respiratory failure, on 4-5L home O2; HTN; h/o bladder CA; and GERD presenting with worsening SOB.  Patient reports he has been sick since Saturday AM - chills, aching all over, nauseated.  Symptoms continued Sunday.  Called PCP this AM and made an appointment.  Flu positive.  4-5L home O2, has had some worsening SOB and turned O2 up to 6L.  Decreased PO, anorexia, living on Ensure for the last 3 days.  Subjective fever.  No emesis.  No diarrhea.  Generalized myalgias.    Had cystoscopy on Friday, started getting sick on Saturday.  Wonders if this is related to his illness.  Hospital Course:  Sepsis secondary to  Influenza - please note that pt has met criteria for sepsis with T 100.42F, HR > 90, RR as high as 40's, WBC 24.8, source Influenza  - started on Tamiflu and today is day #4 out of 5; 1 more day prescribed at discharge - for empiric coverage of Pseudomonas, pt was started on Zosyn and Cipro now after day #3 will switch to PO levaquin to complete therapy of superimposed CAP - repeat CBC with normal WBC's at discharge; patient is afebrile and overall improving  -will add flutter valve and mucinex for symptoms management and expectoration -follow up with PCP in 1 week and with pulmonary service in 2 weeks.  Pulmonary fibrosis, acute on chronic respiratory failure with hypoxia - patient with severe underlying ILD which places him at marked increased risk for infections - Uses 4-5L [place] O2 as baseline, currently requiring 6L intermittently (especially on exertion) - With increased O2 requirement, active influenza infection, pt also treated for presumptive CAP - after 3 days of zosyn and cipro; antibiotics changed to levaquin and the plan is to complete a total of 8 days -patient will continue home inhaler regimen and will follow up with pulmonary service in 2 weeks.  Hyponatremia -Moderate -Likely hypovolemic hyponatremia -improved/essentially resolved with IVF's -HCTZ and PNA infection contributing -will recommend BMET at follow up visit to assure stability   Thrombocytopenia - Platelets climbing up to normal range at discharge - Will need to monitor CBC at follow up visit to assure stability   HTN, essential  -  Continue Atenolol and micardis at discharge  HLD - Continue Lipitor 80 mg  Acute on chronic CKD, stage III at baseline - Appears to be at/near baseline at discharge - BMP to be repeated at follow up visit recommended -pre-renal in nature -will resume micardis at discharge  Chronic pain - Dr. Lorin Mercy reviewed patient's in the [place] Controlled Substances Reporting System.  -  He is receiving his medications from only one provider and appears to be taking them as prescribed. -will continue home pain medication regimen  Procedures:  See below for x-ray reports   Consultations:  None   Discharge Exam: Vitals:   03/08/16 0818 03/08/16 1202  BP: (!) 153/71 139/64  Pulse: 77 61  Resp: (!) 26 (!) 22  Temp: 98.6 F (37 C) 98.2 F (36.8 C)   General exam: Appears calm and is afebrile; still feeling weak and with SOB on exertion; but overall improved and looking to be discharge.  Respiratory system: diminished breath sounds at bases with scattered rhonchi, productive cough appreciated. No using accessory muscles. Cardiovascular system: S1 & S2 heard, RRR. No JVD, murmurs, rubs, gallops or clicks. No pedal edema. Gastrointestinal system: Abdomen is nondistended, soft and nontender. No organomegaly or masses felt.  Central nervous system: Alert and oriented. No focal neurological deficits. Extremities: Symmetric 5 x 5 power.   Discharge Instructions   Discharge Instructions    Diet - low sodium heart healthy    Complete by:  As directed    Discharge instructions    Complete by:  As directed    Good nutrition and hydration Take medications as prescribed Please arrange follow up with PCP in 1 week Arrange follow up pulmonologist in 2 weeks Increase activity as tolerated     Current Discharge Medication List    START taking these medications   Details  feeding supplement, ENSURE ENLIVE, (ENSURE ENLIVE) LIQD Take 237 mLs by mouth 2 (two) times daily between meals. Qty: 237 mL, Refills: 12    guaiFENesin (MUCINEX) 600 MG 12 hr tablet Take 1 tablet (600 mg total) by mouth 2 (two) times daily. Qty: 40 tablet, Refills: 0    levofloxacin (LEVAQUIN) 750 MG tablet Take 1 tablet (750 mg total) by mouth daily. Qty: 5 tablet, Refills: 0      CONTINUE these medications which have CHANGED   Details  oseltamivir (TAMIFLU) 75 MG capsule Take 1 capsule (75 mg  total) by mouth 2 (two) times daily. Qty: 2 capsule, Refills: 0      CONTINUE these medications which have NOT CHANGED   Details  ALPRAZolam (XANAX) 0.25 MG tablet Take 1 tablet (0.25 mg total) by mouth daily as needed for anxiety. Qty: 10 tablet, Refills: 0    aspirin 325 MG tablet Take 162.5 mg by mouth daily.    atenolol (TENORMIN) 25 MG tablet Take 25 mg by mouth daily.    atorvastatin (LIPITOR) 80 MG tablet Take 80 mg by mouth daily.     famotidine (PEPCID) 20 MG tablet 1 twice daily before meals Qty: 180 tablet, Refills: 3    HYDROcodone-homatropine (HYCODAN) 5-1.5 MG/5ML syrup Take 5 mLs by mouth every 6 (six) hours as needed for cough.    KLOR-CON M10 10 MEQ tablet Take 1 tablet by mouth daily.    morphine (MS CONTIN) 15 MG 12 hr tablet Take 15 mg by mouth at bedtime.    omeprazole (PRILOSEC) 20 MG capsule Take 20 mg by mouth daily. 30 minutes before breakfast    Pirfenidone  267 MG CAPS Take 3 tablets by mouth 3 (three) times daily.    tamsulosin (FLOMAX) 0.4 MG CAPS capsule Take 0.4 mg by mouth daily.    telmisartan-hydrochlorothiazide (MICARDIS HCT) 80-25 MG per tablet Take 1 tablet by mouth daily.    Vitamins/Minerals TABS Take 1 tablet by mouth daily.       No Known Allergies Follow-up Information    [name], MD. Go on 03/13/2016.   Specialty:  Family Medicine Why:  '@11'$ :45am Contact information: [address] [place] [address] [phone]            The results of significant diagnostics from this hospitalization (including imaging, microbiology, ancillary and laboratory) are listed below for reference.    Significant Diagnostic Studies: Dg Chest 2 View  Result Date: 03/05/2016 CLINICAL DATA:  Increase shortness of breath over the past 3 days, positive feet test today EXAM: CHEST  2 VIEW COMPARISON:  07/28/2015 FINDINGS: Cardiac shadow is stable. Diffuse fibrotic changes are again identified bilaterally. No focal  confluent infiltrate or sizable effusion is seen. No bony abnormality is noted. IMPRESSION: Chronic fibrotic changes bilaterally without acute abnormality. Electronically Signed   By: Inez Catalina M.D.   On: 03/05/2016 15:16    Microbiology: Recent Results (from the past 240 hour(s))  Respiratory Panel by PCR     Status: None   Collection Time: 03/06/16  6:14 AM  Result Value Ref Range Status   Adenovirus NOT DETECTED NOT DETECTED Final   Coronavirus 229E NOT DETECTED NOT DETECTED Final   Coronavirus HKU1 NOT DETECTED NOT DETECTED Final   Coronavirus NL63 NOT DETECTED NOT DETECTED Final   Coronavirus OC43 NOT DETECTED NOT DETECTED Final   Metapneumovirus NOT DETECTED NOT DETECTED Final   Rhinovirus / Enterovirus NOT DETECTED NOT DETECTED Final   Influenza A NOT DETECTED NOT DETECTED Final   Influenza B NOT DETECTED NOT DETECTED Final   Parainfluenza Virus 1 NOT DETECTED NOT DETECTED Final   Parainfluenza Virus 2 NOT DETECTED NOT DETECTED Final   Parainfluenza Virus 3 NOT DETECTED NOT DETECTED Final   Parainfluenza Virus 4 NOT DETECTED NOT DETECTED Final   Respiratory Syncytial Virus NOT DETECTED NOT DETECTED Final   Bordetella pertussis NOT DETECTED NOT DETECTED Final   Chlamydophila pneumoniae NOT DETECTED NOT DETECTED Final   Mycoplasma pneumoniae NOT DETECTED NOT DETECTED Final     Labs: Basic Metabolic Panel:  Recent Labs Lab 03/05/16 1442 03/06/16 0309 03/07/16 0526  NA 126* 127* 132*  K 4.0 3.8 3.7  CL 88* 93* 100*  CO2 '30 28 25  '$ GLUCOSE 134* 126* 95  BUN 21* 16 11  CREATININE 1.43* 1.34* 1.12  CALCIUM 8.8* 8.0* 8.2*   CBC:  Recent Labs Lab 03/05/16 1442 03/06/16 0309 03/07/16 0526  WBC 24.8* 20.0* 10.1  HGB 14.8 11.5* 11.7*  HCT 42.3 32.8* 33.4*  MCV 85.5 83.9 84.8  PLT 144* 120* 131*     Signed:  Barton Dubois MD.  [facility] 03/08/2016, 5:00 PM

## 2016-03-09 ENCOUNTER — Telehealth: Payer: Self-pay | Admitting: Internal Medicine

## 2016-03-09 DIAGNOSIS — J84112 Idiopathic pulmonary fibrosis: Secondary | ICD-10-CM

## 2016-03-09 NOTE — Telephone Encounter (Signed)
Spoke with pt's wife. States that pt is having issues with this oxygen levels. Reports that any time the pt stands up or exerts hisself his oxygen levels drops to 82-83% on 5L. If he sits down and catches his breath, his oxygen level comes back up to 89% on 5L. States that he does experience shortness of breath when his oxygen level drops. He is being treated for the flu right now. Pt and his wife would like CY's recommendations. CY - please advise. Thanks.  No Known Allergies Current Outpatient Prescriptions on File Prior to Visit  Medication Sig Dispense Refill  . ALPRAZolam (XANAX) 0.25 MG tablet Take 1 tablet (0.25 mg total) by mouth daily as needed for anxiety. 10 tablet 0  . aspirin 325 MG tablet Take 162.5 mg by mouth daily.    [name] atenolol (TENORMIN) 25 MG tablet Take 25 mg by mouth daily.    [name] atorvastatin (LIPITOR) 80 MG tablet Take 80 mg by mouth daily.     . famotidine (PEPCID) 20 MG tablet 1 twice daily before meals (Patient taking differently: Take 20 mg by mouth daily. ) 180 tablet 3  . feeding supplement, ENSURE ENLIVE, (ENSURE ENLIVE) LIQD Take 237 mLs by mouth 2 (two) times daily between meals. 237 mL 12  . guaiFENesin (MUCINEX) 600 MG 12 hr tablet Take 1 tablet (600 mg total) by mouth 2 (two) times daily. 40 tablet 0  . HYDROcodone-homatropine (HYCODAN) 5-1.5 MG/5ML syrup Take 5 mLs by mouth every 6 (six) hours as needed for cough.    [name] KLOR-CON M10 10 MEQ tablet Take 1 tablet by mouth daily.    [name] levofloxacin (LEVAQUIN) 750 MG tablet Take 1 tablet (750 mg total) by mouth daily. 5 tablet 0  . morphine (MS CONTIN) 15 MG 12 hr tablet Take 15 mg by mouth at bedtime.    [name] omeprazole (PRILOSEC) 20 MG capsule Take 20 mg by mouth daily. 30 minutes before breakfast    . oseltamivir (TAMIFLU) 75 MG capsule Take 1 capsule (75 mg total) by mouth 2 (two) times daily. 2 capsule 0  . Pirfenidone 267 MG CAPS Take 3 tablets by mouth 3 (three) times daily.    . tamsulosin (FLOMAX) 0.4 MG  CAPS capsule Take 0.4 mg by mouth daily.    [name] telmisartan-hydrochlorothiazide (MICARDIS HCT) 80-25 MG per tablet Take 1 tablet by mouth daily.    . Vitamins/Minerals TABS Take 1 tablet by mouth daily.     No current facility-administered medications on file prior to visit.

## 2016-03-09 NOTE — Telephone Encounter (Signed)
Called spoke with spouse and informed of CY's recommendations as stated below.  Spouse voiced her understanding.   Order placed to [facility].  Did place order stat due to the time and pt's saturations.  Did also advise spouse to contact the office or seek emergency attention if pt's breathing worsens.  She voiced her understanding.  Nothing further needed at this time; will sign off.

## 2016-03-09 NOTE — Telephone Encounter (Signed)
Please see if his DME can provide him with an Oxymiser for use with his O2

## 2016-03-13 ENCOUNTER — Encounter (HOSPITAL_COMMUNITY): Admission: RE | Admit: 2016-03-13 | Payer: Self-pay | Source: Ambulatory Visit

## 2016-03-13 DIAGNOSIS — I10 Essential (primary) hypertension: Secondary | ICD-10-CM | POA: Diagnosis not present

## 2016-03-13 DIAGNOSIS — J8410 Pulmonary fibrosis, unspecified: Secondary | ICD-10-CM | POA: Diagnosis not present

## 2016-03-13 DIAGNOSIS — E871 Hypo-osmolality and hyponatremia: Secondary | ICD-10-CM | POA: Diagnosis not present

## 2016-03-13 DIAGNOSIS — D696 Thrombocytopenia, unspecified: Secondary | ICD-10-CM | POA: Diagnosis not present

## 2016-03-13 DIAGNOSIS — B356 Tinea cruris: Secondary | ICD-10-CM | POA: Diagnosis not present

## 2016-03-13 DIAGNOSIS — N183 Chronic kidney disease, stage 3 (moderate): Secondary | ICD-10-CM | POA: Diagnosis not present

## 2016-03-15 ENCOUNTER — Encounter (HOSPITAL_COMMUNITY): Admission: RE | Admit: 2016-03-15 | Payer: Self-pay | Source: Ambulatory Visit

## 2016-03-15 DIAGNOSIS — Z79899 Other long term (current) drug therapy: Secondary | ICD-10-CM | POA: Diagnosis not present

## 2016-03-15 DIAGNOSIS — J849 Interstitial pulmonary disease, unspecified: Secondary | ICD-10-CM | POA: Diagnosis not present

## 2016-03-15 DIAGNOSIS — R0902 Hypoxemia: Secondary | ICD-10-CM | POA: Diagnosis not present

## 2016-03-15 DIAGNOSIS — R0602 Shortness of breath: Secondary | ICD-10-CM | POA: Diagnosis not present

## 2016-03-20 ENCOUNTER — Encounter (HOSPITAL_COMMUNITY): Payer: Self-pay

## 2016-03-22 ENCOUNTER — Encounter (HOSPITAL_COMMUNITY): Admission: RE | Admit: 2016-03-22 | Payer: Self-pay | Source: Ambulatory Visit

## 2016-03-22 DIAGNOSIS — J90 Pleural effusion, not elsewhere classified: Secondary | ICD-10-CM | POA: Insufficient documentation

## 2016-03-22 DIAGNOSIS — R05 Cough: Secondary | ICD-10-CM | POA: Insufficient documentation

## 2016-03-22 DIAGNOSIS — I10 Essential (primary) hypertension: Secondary | ICD-10-CM | POA: Insufficient documentation

## 2016-03-22 DIAGNOSIS — K219 Gastro-esophageal reflux disease without esophagitis: Secondary | ICD-10-CM | POA: Insufficient documentation

## 2016-03-22 DIAGNOSIS — Z5189 Encounter for other specified aftercare: Secondary | ICD-10-CM | POA: Insufficient documentation

## 2016-03-22 DIAGNOSIS — E785 Hyperlipidemia, unspecified: Secondary | ICD-10-CM | POA: Insufficient documentation

## 2016-03-22 DIAGNOSIS — R0600 Dyspnea, unspecified: Secondary | ICD-10-CM | POA: Insufficient documentation

## 2016-03-22 DIAGNOSIS — J8410 Pulmonary fibrosis, unspecified: Secondary | ICD-10-CM | POA: Insufficient documentation

## 2016-03-23 ENCOUNTER — Telehealth: Payer: Self-pay | Admitting: Internal Medicine

## 2016-03-23 DIAGNOSIS — J9611 Chronic respiratory failure with hypoxia: Secondary | ICD-10-CM

## 2016-03-23 DIAGNOSIS — J84112 Idiopathic pulmonary fibrosis: Secondary | ICD-10-CM

## 2016-03-23 NOTE — Telephone Encounter (Signed)
Called and spoke with Leafy Ro at [facility] and she stated that they have already delivered the 10 liter concentrator to the pt and they do not have an order for this.  CY please advise if ok to place the order to [facility] for this. thanks

## 2016-03-23 NOTE — Telephone Encounter (Signed)
Order DME [facility]- Continuous O2 4-10 L/min     Dx UIP, pulmonary fibrosis, chronic respiratory failure with hypoxia

## 2016-03-23 NOTE — Telephone Encounter (Signed)
Order placed. Nothing further needed. 

## 2016-03-27 ENCOUNTER — Encounter (HOSPITAL_COMMUNITY): Payer: Self-pay

## 2016-03-29 ENCOUNTER — Encounter (HOSPITAL_COMMUNITY)
Admission: RE | Admit: 2016-03-29 | Discharge: 2016-03-29 | Disposition: A | Discharge status: Home / Self Care | Payer: Self-pay | Source: Ambulatory Visit | Attending: Internal Medicine | Admitting: Internal Medicine

## 2016-03-29 NOTE — Progress Notes (Signed)
Nutrition Consult Consult re: wt loss. Pt has lost 8 lb (3.7 kg) over the past month due to hospitalization. Pt reports decreased appetite. Pt drinks Ensure and [employer]. Pt educated re: High Calorie, High Protein diet. Handouts for High Calorie, High Protein diet, recipes, and suggestions for increasing kcal and prot given. Pt encouraged to discuss starting an appetite stimulant if his appetite does not improve/wt loss continues. Pt expressed understanding of the information reviewed. Continue client-centered nutrition education by RD as part of interdisciplinary care.  Monitor and evaluate progress toward nutrition goal with team.  Derek Mound, M.Ed, RD, LDN, CDE 03/29/2016 10:11 AM

## 2016-04-03 ENCOUNTER — Encounter (HOSPITAL_COMMUNITY)
Admission: RE | Admit: 2016-04-03 | Discharge: 2016-04-03 | Disposition: A | Discharge status: Home / Self Care | Payer: Self-pay | Source: Ambulatory Visit | Attending: Internal Medicine | Admitting: Internal Medicine

## 2016-04-05 ENCOUNTER — Encounter (HOSPITAL_COMMUNITY)
Admission: RE | Admit: 2016-04-05 | Discharge: 2016-04-05 | Disposition: A | Discharge status: Home / Self Care | Payer: Self-pay | Source: Ambulatory Visit | Attending: Internal Medicine | Admitting: Internal Medicine

## 2016-04-10 ENCOUNTER — Encounter (HOSPITAL_COMMUNITY): Payer: Self-pay

## 2016-04-12 ENCOUNTER — Encounter (HOSPITAL_COMMUNITY): Payer: Self-pay

## 2016-04-17 ENCOUNTER — Encounter (HOSPITAL_COMMUNITY): Payer: Self-pay

## 2016-04-19 ENCOUNTER — Encounter (HOSPITAL_COMMUNITY)
Admission: RE | Admit: 2016-04-19 | Discharge: 2016-04-19 | Disposition: A | Discharge status: Home / Self Care | Payer: Self-pay | Source: Ambulatory Visit | Attending: Internal Medicine | Admitting: Internal Medicine

## 2016-04-24 ENCOUNTER — Encounter (HOSPITAL_COMMUNITY)
Admission: RE | Admit: 2016-04-24 | Discharge: 2016-04-24 | Disposition: A | Discharge status: Home / Self Care | Payer: Self-pay | Source: Ambulatory Visit | Attending: Internal Medicine | Admitting: Internal Medicine

## 2016-04-24 DIAGNOSIS — J90 Pleural effusion, not elsewhere classified: Secondary | ICD-10-CM | POA: Insufficient documentation

## 2016-04-24 DIAGNOSIS — K219 Gastro-esophageal reflux disease without esophagitis: Secondary | ICD-10-CM | POA: Insufficient documentation

## 2016-04-24 DIAGNOSIS — I10 Essential (primary) hypertension: Secondary | ICD-10-CM | POA: Insufficient documentation

## 2016-04-24 DIAGNOSIS — E785 Hyperlipidemia, unspecified: Secondary | ICD-10-CM | POA: Insufficient documentation

## 2016-04-24 DIAGNOSIS — Z5189 Encounter for other specified aftercare: Secondary | ICD-10-CM | POA: Insufficient documentation

## 2016-04-24 DIAGNOSIS — R0600 Dyspnea, unspecified: Secondary | ICD-10-CM | POA: Insufficient documentation

## 2016-04-24 DIAGNOSIS — R05 Cough: Secondary | ICD-10-CM | POA: Insufficient documentation

## 2016-04-24 DIAGNOSIS — J8410 Pulmonary fibrosis, unspecified: Secondary | ICD-10-CM | POA: Insufficient documentation

## 2016-04-26 ENCOUNTER — Encounter (HOSPITAL_COMMUNITY)
Admission: RE | Admit: 2016-04-26 | Discharge: 2016-04-26 | Disposition: A | Discharge status: Home / Self Care | Payer: Self-pay | Source: Ambulatory Visit | Attending: Internal Medicine | Admitting: Internal Medicine

## 2016-05-01 ENCOUNTER — Encounter (HOSPITAL_COMMUNITY): Payer: Self-pay

## 2016-05-03 ENCOUNTER — Encounter (HOSPITAL_COMMUNITY): Payer: Self-pay

## 2016-05-03 DIAGNOSIS — J84112 Idiopathic pulmonary fibrosis: Secondary | ICD-10-CM | POA: Diagnosis not present

## 2016-05-03 DIAGNOSIS — J849 Interstitial pulmonary disease, unspecified: Secondary | ICD-10-CM | POA: Diagnosis not present

## 2016-05-03 DIAGNOSIS — R0602 Shortness of breath: Secondary | ICD-10-CM | POA: Diagnosis not present

## 2016-05-03 DIAGNOSIS — R110 Nausea: Secondary | ICD-10-CM | POA: Diagnosis not present

## 2016-05-08 ENCOUNTER — Encounter (HOSPITAL_COMMUNITY)
Admission: RE | Admit: 2016-05-08 | Discharge: 2016-05-08 | Disposition: A | Discharge status: Home / Self Care | Payer: Self-pay | Source: Ambulatory Visit | Attending: Internal Medicine | Admitting: Internal Medicine

## 2016-05-10 ENCOUNTER — Encounter (HOSPITAL_COMMUNITY): Payer: Self-pay

## 2016-05-15 ENCOUNTER — Encounter (HOSPITAL_COMMUNITY): Payer: Self-pay

## 2016-05-17 ENCOUNTER — Encounter (HOSPITAL_COMMUNITY): Payer: Self-pay

## 2016-05-22 ENCOUNTER — Encounter (HOSPITAL_COMMUNITY): Payer: Self-pay

## 2016-05-22 DIAGNOSIS — E785 Hyperlipidemia, unspecified: Secondary | ICD-10-CM | POA: Insufficient documentation

## 2016-05-22 DIAGNOSIS — J90 Pleural effusion, not elsewhere classified: Secondary | ICD-10-CM | POA: Insufficient documentation

## 2016-05-22 DIAGNOSIS — R0600 Dyspnea, unspecified: Secondary | ICD-10-CM | POA: Insufficient documentation

## 2016-05-22 DIAGNOSIS — J8410 Pulmonary fibrosis, unspecified: Secondary | ICD-10-CM | POA: Insufficient documentation

## 2016-05-22 DIAGNOSIS — Z5189 Encounter for other specified aftercare: Secondary | ICD-10-CM | POA: Insufficient documentation

## 2016-05-22 DIAGNOSIS — I10 Essential (primary) hypertension: Secondary | ICD-10-CM | POA: Insufficient documentation

## 2016-05-22 DIAGNOSIS — R05 Cough: Secondary | ICD-10-CM | POA: Insufficient documentation

## 2016-05-22 DIAGNOSIS — K219 Gastro-esophageal reflux disease without esophagitis: Secondary | ICD-10-CM | POA: Insufficient documentation

## 2016-05-23 DIAGNOSIS — H6123 Impacted cerumen, bilateral: Secondary | ICD-10-CM | POA: Diagnosis not present

## 2016-05-23 DIAGNOSIS — J8410 Pulmonary fibrosis, unspecified: Secondary | ICD-10-CM | POA: Diagnosis not present

## 2016-05-23 DIAGNOSIS — E7800 Pure hypercholesterolemia, unspecified: Secondary | ICD-10-CM | POA: Diagnosis not present

## 2016-05-23 DIAGNOSIS — Z23 Encounter for immunization: Secondary | ICD-10-CM | POA: Diagnosis not present

## 2016-05-23 DIAGNOSIS — Z0001 Encounter for general adult medical examination with abnormal findings: Secondary | ICD-10-CM | POA: Diagnosis not present

## 2016-05-23 DIAGNOSIS — D696 Thrombocytopenia, unspecified: Secondary | ICD-10-CM | POA: Diagnosis not present

## 2016-05-23 DIAGNOSIS — Z79899 Other long term (current) drug therapy: Secondary | ICD-10-CM | POA: Diagnosis not present

## 2016-05-23 DIAGNOSIS — I10 Essential (primary) hypertension: Secondary | ICD-10-CM | POA: Diagnosis not present

## 2016-05-24 ENCOUNTER — Encounter (HOSPITAL_COMMUNITY): Admission: RE | Admit: 2016-05-24 | Payer: Self-pay | Source: Ambulatory Visit

## 2016-05-29 ENCOUNTER — Encounter (HOSPITAL_COMMUNITY)
Admission: RE | Admit: 2016-05-29 | Discharge: 2016-05-29 | Disposition: A | Discharge status: Home / Self Care | Payer: Self-pay | Source: Ambulatory Visit | Attending: Internal Medicine | Admitting: Internal Medicine

## 2016-05-31 ENCOUNTER — Encounter (HOSPITAL_COMMUNITY)
Admission: RE | Admit: 2016-05-31 | Discharge: 2016-05-31 | Disposition: A | Discharge status: Home / Self Care | Payer: Self-pay | Source: Ambulatory Visit | Attending: Internal Medicine | Admitting: Internal Medicine

## 2016-06-01 DIAGNOSIS — H6123 Impacted cerumen, bilateral: Secondary | ICD-10-CM | POA: Diagnosis not present

## 2016-06-01 DIAGNOSIS — H93293 Other abnormal auditory perceptions, bilateral: Secondary | ICD-10-CM | POA: Diagnosis not present

## 2016-06-05 ENCOUNTER — Encounter (HOSPITAL_COMMUNITY)
Admission: RE | Admit: 2016-06-05 | Discharge: 2016-06-05 | Disposition: A | Discharge status: Home / Self Care | Payer: Self-pay | Source: Ambulatory Visit | Attending: Internal Medicine | Admitting: Internal Medicine

## 2016-06-05 ENCOUNTER — Encounter (HOSPITAL_COMMUNITY): Payer: Self-pay

## 2016-06-07 ENCOUNTER — Encounter (HOSPITAL_COMMUNITY): Payer: Self-pay

## 2016-06-12 ENCOUNTER — Encounter (HOSPITAL_COMMUNITY): Payer: Self-pay

## 2016-06-14 ENCOUNTER — Encounter (HOSPITAL_COMMUNITY)
Admission: RE | Admit: 2016-06-14 | Discharge: 2016-06-14 | Disposition: A | Discharge status: Home / Self Care | Payer: Self-pay | Source: Ambulatory Visit | Attending: Internal Medicine | Admitting: Internal Medicine

## 2016-06-19 ENCOUNTER — Encounter (HOSPITAL_COMMUNITY)
Admission: RE | Admit: 2016-06-19 | Discharge: 2016-06-19 | Disposition: A | Discharge status: Home / Self Care | Payer: Self-pay | Source: Ambulatory Visit | Attending: Internal Medicine | Admitting: Internal Medicine

## 2016-06-21 ENCOUNTER — Encounter (HOSPITAL_COMMUNITY)
Admission: RE | Admit: 2016-06-21 | Discharge: 2016-06-21 | Disposition: A | Discharge status: Home / Self Care | Payer: Self-pay | Source: Ambulatory Visit | Attending: Internal Medicine | Admitting: Internal Medicine

## 2016-06-21 DIAGNOSIS — J8410 Pulmonary fibrosis, unspecified: Secondary | ICD-10-CM | POA: Insufficient documentation

## 2016-06-21 DIAGNOSIS — R05 Cough: Secondary | ICD-10-CM | POA: Insufficient documentation

## 2016-06-21 DIAGNOSIS — Z5189 Encounter for other specified aftercare: Secondary | ICD-10-CM | POA: Insufficient documentation

## 2016-06-21 DIAGNOSIS — E785 Hyperlipidemia, unspecified: Secondary | ICD-10-CM | POA: Insufficient documentation

## 2016-06-21 DIAGNOSIS — K219 Gastro-esophageal reflux disease without esophagitis: Secondary | ICD-10-CM | POA: Insufficient documentation

## 2016-06-21 DIAGNOSIS — J90 Pleural effusion, not elsewhere classified: Secondary | ICD-10-CM | POA: Insufficient documentation

## 2016-06-21 DIAGNOSIS — I10 Essential (primary) hypertension: Secondary | ICD-10-CM | POA: Insufficient documentation

## 2016-06-21 DIAGNOSIS — R0600 Dyspnea, unspecified: Secondary | ICD-10-CM | POA: Insufficient documentation

## 2016-06-26 ENCOUNTER — Encounter (HOSPITAL_COMMUNITY): Payer: Self-pay

## 2016-06-28 ENCOUNTER — Encounter (HOSPITAL_COMMUNITY)
Admission: RE | Admit: 2016-06-28 | Discharge: 2016-06-28 | Disposition: A | Discharge status: Home / Self Care | Payer: Self-pay | Source: Ambulatory Visit | Attending: Internal Medicine | Admitting: Internal Medicine

## 2016-07-03 ENCOUNTER — Encounter (HOSPITAL_COMMUNITY)
Admission: RE | Admit: 2016-07-03 | Discharge: 2016-07-03 | Disposition: A | Discharge status: Home / Self Care | Payer: Self-pay | Source: Ambulatory Visit | Attending: Internal Medicine | Admitting: Internal Medicine

## 2016-07-05 ENCOUNTER — Encounter (HOSPITAL_COMMUNITY): Payer: Self-pay

## 2016-07-10 ENCOUNTER — Encounter (HOSPITAL_COMMUNITY): Payer: Self-pay

## 2016-07-12 ENCOUNTER — Encounter (HOSPITAL_COMMUNITY)
Admission: RE | Admit: 2016-07-12 | Discharge: 2016-07-12 | Disposition: A | Discharge status: Home / Self Care | Payer: Self-pay | Source: Ambulatory Visit | Attending: Internal Medicine | Admitting: Internal Medicine

## 2016-07-17 ENCOUNTER — Encounter (HOSPITAL_COMMUNITY): Payer: Self-pay

## 2016-07-19 ENCOUNTER — Encounter (HOSPITAL_COMMUNITY)
Admission: RE | Admit: 2016-07-19 | Discharge: 2016-07-19 | Disposition: A | Discharge status: Home / Self Care | Payer: Self-pay | Source: Ambulatory Visit | Attending: Internal Medicine | Admitting: Internal Medicine

## 2016-07-19 DIAGNOSIS — Z5189 Encounter for other specified aftercare: Secondary | ICD-10-CM | POA: Insufficient documentation

## 2016-07-19 DIAGNOSIS — R05 Cough: Secondary | ICD-10-CM | POA: Insufficient documentation

## 2016-07-19 DIAGNOSIS — I10 Essential (primary) hypertension: Secondary | ICD-10-CM | POA: Insufficient documentation

## 2016-07-19 DIAGNOSIS — R0600 Dyspnea, unspecified: Secondary | ICD-10-CM | POA: Insufficient documentation

## 2016-07-19 DIAGNOSIS — J90 Pleural effusion, not elsewhere classified: Secondary | ICD-10-CM | POA: Insufficient documentation

## 2016-07-19 DIAGNOSIS — J8410 Pulmonary fibrosis, unspecified: Secondary | ICD-10-CM | POA: Insufficient documentation

## 2016-07-19 DIAGNOSIS — K219 Gastro-esophageal reflux disease without esophagitis: Secondary | ICD-10-CM | POA: Insufficient documentation

## 2016-07-19 DIAGNOSIS — E785 Hyperlipidemia, unspecified: Secondary | ICD-10-CM | POA: Insufficient documentation

## 2016-07-24 ENCOUNTER — Encounter (HOSPITAL_COMMUNITY): Payer: Self-pay

## 2016-07-26 ENCOUNTER — Encounter (HOSPITAL_COMMUNITY)
Admission: RE | Admit: 2016-07-26 | Discharge: 2016-07-26 | Disposition: A | Discharge status: Home / Self Care | Payer: Self-pay | Source: Ambulatory Visit | Attending: Internal Medicine | Admitting: Internal Medicine

## 2016-07-31 ENCOUNTER — Encounter (HOSPITAL_COMMUNITY): Payer: Medicare Other

## 2016-08-02 ENCOUNTER — Encounter (HOSPITAL_COMMUNITY): Admission: RE | Admit: 2016-08-02 | Payer: Medicare Other | Source: Ambulatory Visit

## 2016-08-07 ENCOUNTER — Encounter (HOSPITAL_COMMUNITY)
Admission: RE | Admit: 2016-08-07 | Discharge: 2016-08-07 | Disposition: A | Discharge status: Home / Self Care | Payer: Self-pay | Source: Ambulatory Visit | Attending: Internal Medicine | Admitting: Internal Medicine

## 2016-08-09 ENCOUNTER — Encounter (HOSPITAL_COMMUNITY): Admission: RE | Admit: 2016-08-09 | Payer: Medicare Other | Source: Ambulatory Visit

## 2016-08-14 ENCOUNTER — Encounter (HOSPITAL_COMMUNITY)
Admission: RE | Admit: 2016-08-14 | Discharge: 2016-08-14 | Disposition: A | Discharge status: Home / Self Care | Payer: Self-pay | Source: Ambulatory Visit | Attending: Internal Medicine | Admitting: Internal Medicine

## 2016-08-16 ENCOUNTER — Encounter (HOSPITAL_COMMUNITY): Payer: Medicare Other

## 2016-08-21 ENCOUNTER — Encounter (HOSPITAL_COMMUNITY)
Admission: RE | Admit: 2016-08-21 | Discharge: 2016-08-21 | Disposition: A | Discharge status: Home / Self Care | Payer: Self-pay | Source: Ambulatory Visit | Attending: Internal Medicine | Admitting: Internal Medicine

## 2016-08-21 DIAGNOSIS — K219 Gastro-esophageal reflux disease without esophagitis: Secondary | ICD-10-CM | POA: Insufficient documentation

## 2016-08-21 DIAGNOSIS — R05 Cough: Secondary | ICD-10-CM | POA: Insufficient documentation

## 2016-08-21 DIAGNOSIS — R0600 Dyspnea, unspecified: Secondary | ICD-10-CM | POA: Insufficient documentation

## 2016-08-21 DIAGNOSIS — E785 Hyperlipidemia, unspecified: Secondary | ICD-10-CM | POA: Insufficient documentation

## 2016-08-21 DIAGNOSIS — J90 Pleural effusion, not elsewhere classified: Secondary | ICD-10-CM | POA: Insufficient documentation

## 2016-08-21 DIAGNOSIS — Z5189 Encounter for other specified aftercare: Secondary | ICD-10-CM | POA: Insufficient documentation

## 2016-08-21 DIAGNOSIS — J8410 Pulmonary fibrosis, unspecified: Secondary | ICD-10-CM | POA: Insufficient documentation

## 2016-08-21 DIAGNOSIS — I10 Essential (primary) hypertension: Secondary | ICD-10-CM | POA: Insufficient documentation

## 2016-08-23 ENCOUNTER — Encounter (HOSPITAL_COMMUNITY)
Admission: RE | Admit: 2016-08-23 | Discharge: 2016-08-23 | Disposition: A | Discharge status: Home / Self Care | Payer: Self-pay | Source: Ambulatory Visit | Attending: Internal Medicine | Admitting: Internal Medicine

## 2016-08-28 ENCOUNTER — Encounter (HOSPITAL_COMMUNITY): Payer: Self-pay

## 2016-08-30 ENCOUNTER — Encounter (HOSPITAL_COMMUNITY)
Admission: RE | Admit: 2016-08-30 | Discharge: 2016-08-30 | Disposition: A | Discharge status: Home / Self Care | Payer: Self-pay | Source: Ambulatory Visit | Attending: Internal Medicine | Admitting: Internal Medicine

## 2016-09-03 DIAGNOSIS — Z79899 Other long term (current) drug therapy: Secondary | ICD-10-CM | POA: Diagnosis not present

## 2016-09-03 DIAGNOSIS — J849 Interstitial pulmonary disease, unspecified: Secondary | ICD-10-CM | POA: Diagnosis not present

## 2016-09-03 DIAGNOSIS — R0602 Shortness of breath: Secondary | ICD-10-CM | POA: Diagnosis not present

## 2016-09-04 ENCOUNTER — Encounter (HOSPITAL_COMMUNITY): Payer: Self-pay

## 2016-09-06 ENCOUNTER — Encounter (HOSPITAL_COMMUNITY)
Admission: RE | Admit: 2016-09-06 | Discharge: 2016-09-06 | Disposition: A | Discharge status: Home / Self Care | Payer: Self-pay | Source: Ambulatory Visit | Attending: Internal Medicine | Admitting: Internal Medicine

## 2016-09-13 ENCOUNTER — Encounter (HOSPITAL_COMMUNITY)
Admission: RE | Admit: 2016-09-13 | Discharge: 2016-09-13 | Disposition: A | Discharge status: Home / Self Care | Payer: Self-pay | Source: Ambulatory Visit | Attending: Internal Medicine | Admitting: Internal Medicine

## 2016-09-18 ENCOUNTER — Encounter (HOSPITAL_COMMUNITY)
Admission: RE | Admit: 2016-09-18 | Discharge: 2016-09-18 | Disposition: A | Discharge status: Home / Self Care | Payer: Self-pay | Source: Ambulatory Visit | Attending: Internal Medicine | Admitting: Internal Medicine

## 2016-09-18 DIAGNOSIS — J8410 Pulmonary fibrosis, unspecified: Secondary | ICD-10-CM | POA: Insufficient documentation

## 2016-09-18 DIAGNOSIS — K219 Gastro-esophageal reflux disease without esophagitis: Secondary | ICD-10-CM | POA: Insufficient documentation

## 2016-09-18 DIAGNOSIS — R05 Cough: Secondary | ICD-10-CM | POA: Insufficient documentation

## 2016-09-18 DIAGNOSIS — Z5189 Encounter for other specified aftercare: Secondary | ICD-10-CM | POA: Insufficient documentation

## 2016-09-18 DIAGNOSIS — I10 Essential (primary) hypertension: Secondary | ICD-10-CM | POA: Insufficient documentation

## 2016-09-18 DIAGNOSIS — R0600 Dyspnea, unspecified: Secondary | ICD-10-CM | POA: Insufficient documentation

## 2016-09-18 DIAGNOSIS — J90 Pleural effusion, not elsewhere classified: Secondary | ICD-10-CM | POA: Insufficient documentation

## 2016-09-18 DIAGNOSIS — E785 Hyperlipidemia, unspecified: Secondary | ICD-10-CM | POA: Insufficient documentation

## 2016-09-20 ENCOUNTER — Encounter (HOSPITAL_COMMUNITY): Payer: Self-pay

## 2016-09-25 ENCOUNTER — Encounter (HOSPITAL_COMMUNITY): Payer: Self-pay

## 2016-09-27 ENCOUNTER — Encounter (HOSPITAL_COMMUNITY): Payer: Self-pay

## 2016-10-02 ENCOUNTER — Encounter (HOSPITAL_COMMUNITY): Payer: Self-pay

## 2016-10-04 ENCOUNTER — Encounter (HOSPITAL_COMMUNITY): Payer: Self-pay

## 2016-10-09 ENCOUNTER — Encounter (HOSPITAL_COMMUNITY)
Admission: RE | Admit: 2016-10-09 | Discharge: 2016-10-09 | Disposition: A | Discharge status: Home / Self Care | Payer: Self-pay | Source: Ambulatory Visit | Attending: Internal Medicine | Admitting: Internal Medicine

## 2016-10-11 ENCOUNTER — Encounter (HOSPITAL_COMMUNITY): Payer: Self-pay

## 2016-10-16 ENCOUNTER — Encounter (HOSPITAL_COMMUNITY)
Admission: RE | Admit: 2016-10-16 | Discharge: 2016-10-16 | Disposition: A | Discharge status: Home / Self Care | Payer: Self-pay | Source: Ambulatory Visit | Attending: Internal Medicine | Admitting: Internal Medicine

## 2016-10-18 ENCOUNTER — Encounter (HOSPITAL_COMMUNITY): Payer: Self-pay

## 2016-10-23 ENCOUNTER — Encounter (HOSPITAL_COMMUNITY): Payer: Self-pay

## 2016-10-23 DIAGNOSIS — J90 Pleural effusion, not elsewhere classified: Secondary | ICD-10-CM | POA: Insufficient documentation

## 2016-10-23 DIAGNOSIS — Z5189 Encounter for other specified aftercare: Secondary | ICD-10-CM | POA: Insufficient documentation

## 2016-10-23 DIAGNOSIS — J8410 Pulmonary fibrosis, unspecified: Secondary | ICD-10-CM | POA: Insufficient documentation

## 2016-10-23 DIAGNOSIS — K219 Gastro-esophageal reflux disease without esophagitis: Secondary | ICD-10-CM | POA: Insufficient documentation

## 2016-10-23 DIAGNOSIS — I10 Essential (primary) hypertension: Secondary | ICD-10-CM | POA: Insufficient documentation

## 2016-10-23 DIAGNOSIS — R05 Cough: Secondary | ICD-10-CM | POA: Insufficient documentation

## 2016-10-23 DIAGNOSIS — E785 Hyperlipidemia, unspecified: Secondary | ICD-10-CM | POA: Insufficient documentation

## 2016-10-23 DIAGNOSIS — R0600 Dyspnea, unspecified: Secondary | ICD-10-CM | POA: Insufficient documentation

## 2016-10-25 ENCOUNTER — Encounter (HOSPITAL_COMMUNITY)
Admission: RE | Admit: 2016-10-25 | Discharge: 2016-10-25 | Disposition: A | Discharge status: Home / Self Care | Payer: Self-pay | Source: Ambulatory Visit | Attending: Internal Medicine | Admitting: Internal Medicine

## 2016-10-30 ENCOUNTER — Encounter (HOSPITAL_COMMUNITY)
Admission: RE | Admit: 2016-10-30 | Discharge: 2016-10-30 | Disposition: A | Discharge status: Home / Self Care | Payer: Self-pay | Source: Ambulatory Visit | Attending: Internal Medicine | Admitting: Internal Medicine

## 2016-11-01 ENCOUNTER — Encounter (HOSPITAL_COMMUNITY): Payer: Self-pay

## 2016-11-06 ENCOUNTER — Encounter (HOSPITAL_COMMUNITY): Payer: Self-pay

## 2016-11-08 ENCOUNTER — Encounter (HOSPITAL_COMMUNITY)
Admission: RE | Admit: 2016-11-08 | Discharge: 2016-11-08 | Disposition: A | Discharge status: Home / Self Care | Payer: Self-pay | Source: Ambulatory Visit | Attending: Internal Medicine | Admitting: Internal Medicine

## 2016-11-13 ENCOUNTER — Encounter (HOSPITAL_COMMUNITY)
Admission: RE | Admit: 2016-11-13 | Discharge: 2016-11-13 | Disposition: A | Discharge status: Home / Self Care | Payer: Self-pay | Source: Ambulatory Visit | Attending: Internal Medicine | Admitting: Internal Medicine

## 2016-11-15 ENCOUNTER — Encounter (HOSPITAL_COMMUNITY): Payer: Self-pay

## 2016-11-20 ENCOUNTER — Encounter (HOSPITAL_COMMUNITY)
Admission: RE | Admit: 2016-11-20 | Discharge: 2016-11-20 | Disposition: A | Discharge status: Home / Self Care | Payer: Self-pay | Source: Ambulatory Visit | Attending: Internal Medicine | Admitting: Internal Medicine

## 2016-11-20 DIAGNOSIS — K219 Gastro-esophageal reflux disease without esophagitis: Secondary | ICD-10-CM | POA: Insufficient documentation

## 2016-11-20 DIAGNOSIS — J8410 Pulmonary fibrosis, unspecified: Secondary | ICD-10-CM | POA: Insufficient documentation

## 2016-11-20 DIAGNOSIS — J90 Pleural effusion, not elsewhere classified: Secondary | ICD-10-CM | POA: Insufficient documentation

## 2016-11-20 DIAGNOSIS — Z5189 Encounter for other specified aftercare: Secondary | ICD-10-CM | POA: Insufficient documentation

## 2016-11-20 DIAGNOSIS — E785 Hyperlipidemia, unspecified: Secondary | ICD-10-CM | POA: Insufficient documentation

## 2016-11-20 DIAGNOSIS — R0600 Dyspnea, unspecified: Secondary | ICD-10-CM | POA: Insufficient documentation

## 2016-11-20 DIAGNOSIS — R05 Cough: Secondary | ICD-10-CM | POA: Insufficient documentation

## 2016-11-20 DIAGNOSIS — I10 Essential (primary) hypertension: Secondary | ICD-10-CM | POA: Insufficient documentation

## 2016-11-22 ENCOUNTER — Encounter (HOSPITAL_COMMUNITY): Admission: RE | Admit: 2016-11-22 | Payer: Self-pay | Source: Ambulatory Visit

## 2016-11-27 ENCOUNTER — Encounter (HOSPITAL_COMMUNITY)
Admission: RE | Admit: 2016-11-27 | Discharge: 2016-11-27 | Disposition: A | Discharge status: Home / Self Care | Payer: Self-pay | Source: Ambulatory Visit | Attending: Internal Medicine | Admitting: Internal Medicine

## 2016-11-29 ENCOUNTER — Encounter (HOSPITAL_COMMUNITY): Payer: Self-pay

## 2016-12-04 ENCOUNTER — Encounter (HOSPITAL_COMMUNITY)
Admission: RE | Admit: 2016-12-04 | Discharge: 2016-12-04 | Disposition: A | Discharge status: Home / Self Care | Payer: Self-pay | Source: Ambulatory Visit | Attending: Internal Medicine | Admitting: Internal Medicine

## 2016-12-06 ENCOUNTER — Encounter (HOSPITAL_COMMUNITY): Payer: Self-pay

## 2016-12-11 ENCOUNTER — Encounter (HOSPITAL_COMMUNITY)
Admission: RE | Admit: 2016-12-11 | Discharge: 2016-12-11 | Disposition: A | Discharge status: Home / Self Care | Payer: Self-pay | Source: Ambulatory Visit | Attending: Internal Medicine | Admitting: Internal Medicine

## 2016-12-13 ENCOUNTER — Encounter (HOSPITAL_COMMUNITY): Payer: Self-pay

## 2016-12-18 ENCOUNTER — Encounter (HOSPITAL_COMMUNITY)
Admission: RE | Admit: 2016-12-18 | Discharge: 2016-12-18 | Disposition: A | Discharge status: Home / Self Care | Payer: Medicare Other | Source: Ambulatory Visit | Attending: Internal Medicine | Admitting: Internal Medicine

## 2016-12-20 ENCOUNTER — Encounter (HOSPITAL_COMMUNITY): Payer: Self-pay

## 2016-12-20 DIAGNOSIS — R0600 Dyspnea, unspecified: Secondary | ICD-10-CM | POA: Insufficient documentation

## 2016-12-20 DIAGNOSIS — K219 Gastro-esophageal reflux disease without esophagitis: Secondary | ICD-10-CM | POA: Insufficient documentation

## 2016-12-20 DIAGNOSIS — E785 Hyperlipidemia, unspecified: Secondary | ICD-10-CM | POA: Insufficient documentation

## 2016-12-20 DIAGNOSIS — R05 Cough: Secondary | ICD-10-CM | POA: Insufficient documentation

## 2016-12-20 DIAGNOSIS — I10 Essential (primary) hypertension: Secondary | ICD-10-CM | POA: Insufficient documentation

## 2016-12-20 DIAGNOSIS — Z5189 Encounter for other specified aftercare: Secondary | ICD-10-CM | POA: Insufficient documentation

## 2016-12-20 DIAGNOSIS — J90 Pleural effusion, not elsewhere classified: Secondary | ICD-10-CM | POA: Insufficient documentation

## 2016-12-20 DIAGNOSIS — J8410 Pulmonary fibrosis, unspecified: Secondary | ICD-10-CM | POA: Insufficient documentation

## 2016-12-25 ENCOUNTER — Encounter (HOSPITAL_COMMUNITY): Payer: Self-pay

## 2016-12-27 ENCOUNTER — Encounter (HOSPITAL_COMMUNITY): Payer: Self-pay

## 2017-01-01 ENCOUNTER — Encounter (HOSPITAL_COMMUNITY)
Admission: RE | Admit: 2017-01-01 | Discharge: 2017-01-01 | Disposition: A | Discharge status: Home / Self Care | Payer: Self-pay | Source: Ambulatory Visit | Attending: Internal Medicine | Admitting: Internal Medicine

## 2017-01-03 ENCOUNTER — Encounter (HOSPITAL_COMMUNITY): Payer: Self-pay

## 2017-01-08 ENCOUNTER — Encounter (HOSPITAL_COMMUNITY): Payer: Self-pay

## 2017-01-10 ENCOUNTER — Encounter (HOSPITAL_COMMUNITY): Payer: Self-pay

## 2017-01-15 ENCOUNTER — Encounter (HOSPITAL_COMMUNITY): Payer: Self-pay

## 2017-01-17 ENCOUNTER — Encounter (HOSPITAL_COMMUNITY): Payer: Self-pay | Admitting: *Deleted

## 2017-01-17 ENCOUNTER — Encounter (HOSPITAL_COMMUNITY): Payer: Self-pay

## 2017-01-17 NOTE — Progress Notes (Signed)
[name] [name] feels that he is unable to participate in the Pulmonary Rehab Maintenance any longer.He is dropping from the program. He feels that he is physically unable to. I encouraged him to stay as active as he is able and to let us know if we can help him going forward.

## 2017-01-22 ENCOUNTER — Encounter (HOSPITAL_COMMUNITY): Payer: Self-pay

## 2017-01-24 ENCOUNTER — Encounter (HOSPITAL_COMMUNITY): Payer: Self-pay

## 2017-01-24 DIAGNOSIS — R110 Nausea: Secondary | ICD-10-CM | POA: Diagnosis not present

## 2017-01-24 DIAGNOSIS — R0602 Shortness of breath: Secondary | ICD-10-CM | POA: Diagnosis not present

## 2017-01-24 DIAGNOSIS — J9611 Chronic respiratory failure with hypoxia: Secondary | ICD-10-CM | POA: Diagnosis not present

## 2017-01-24 DIAGNOSIS — J849 Interstitial pulmonary disease, unspecified: Secondary | ICD-10-CM | POA: Diagnosis not present

## 2017-01-24 DIAGNOSIS — Z79899 Other long term (current) drug therapy: Secondary | ICD-10-CM | POA: Diagnosis not present

## 2017-01-29 ENCOUNTER — Encounter (HOSPITAL_COMMUNITY): Payer: Self-pay

## 2017-01-31 ENCOUNTER — Encounter (HOSPITAL_COMMUNITY): Payer: Self-pay

## 2017-02-05 ENCOUNTER — Encounter (HOSPITAL_COMMUNITY): Payer: Self-pay

## 2017-02-07 ENCOUNTER — Encounter (HOSPITAL_COMMUNITY): Payer: Self-pay

## 2017-02-12 ENCOUNTER — Encounter (HOSPITAL_COMMUNITY): Payer: Self-pay

## 2017-02-14 ENCOUNTER — Encounter (HOSPITAL_COMMUNITY): Payer: Medicare Other

## 2017-02-19 ENCOUNTER — Encounter (HOSPITAL_COMMUNITY): Payer: Self-pay

## 2017-02-21 ENCOUNTER — Encounter (HOSPITAL_COMMUNITY): Payer: Self-pay

## 2017-02-26 ENCOUNTER — Encounter (HOSPITAL_COMMUNITY): Payer: Self-pay

## 2017-02-28 ENCOUNTER — Encounter (HOSPITAL_COMMUNITY): Payer: Self-pay

## 2017-03-01 DIAGNOSIS — Z23 Encounter for immunization: Secondary | ICD-10-CM | POA: Diagnosis not present

## 2017-03-05 ENCOUNTER — Encounter (HOSPITAL_COMMUNITY): Payer: Self-pay

## 2017-03-07 ENCOUNTER — Encounter (HOSPITAL_COMMUNITY): Payer: Self-pay

## 2017-03-12 ENCOUNTER — Encounter (HOSPITAL_COMMUNITY): Payer: Self-pay

## 2017-03-14 ENCOUNTER — Encounter (HOSPITAL_COMMUNITY): Payer: Self-pay

## 2017-03-15 ENCOUNTER — Emergency Department (HOSPITAL_COMMUNITY): Payer: Medicare Other

## 2017-03-15 ENCOUNTER — Encounter (HOSPITAL_COMMUNITY): Payer: Self-pay | Admitting: Emergency Medicine

## 2017-03-15 ENCOUNTER — Inpatient Hospital Stay (HOSPITAL_COMMUNITY)
Admission: EM | Admit: 2017-03-15 | Discharge: 2017-03-26 | DRG: 196 | Disposition: A | Discharge status: Hospice | Payer: Medicare Other | Attending: Internal Medicine | Admitting: Internal Medicine

## 2017-03-15 DIAGNOSIS — Z79899 Other long term (current) drug therapy: Secondary | ICD-10-CM

## 2017-03-15 DIAGNOSIS — R0609 Other forms of dyspnea: Secondary | ICD-10-CM

## 2017-03-15 DIAGNOSIS — R0602 Shortness of breath: Secondary | ICD-10-CM

## 2017-03-15 DIAGNOSIS — K219 Gastro-esophageal reflux disease without esophagitis: Secondary | ICD-10-CM | POA: Diagnosis present

## 2017-03-15 DIAGNOSIS — E871 Hypo-osmolality and hyponatremia: Secondary | ICD-10-CM | POA: Diagnosis not present

## 2017-03-15 DIAGNOSIS — G894 Chronic pain syndrome: Secondary | ICD-10-CM | POA: Diagnosis present

## 2017-03-15 DIAGNOSIS — Z515 Encounter for palliative care: Secondary | ICD-10-CM | POA: Diagnosis not present

## 2017-03-15 DIAGNOSIS — Z825 Family history of asthma and other chronic lower respiratory diseases: Secondary | ICD-10-CM

## 2017-03-15 DIAGNOSIS — Z87891 Personal history of nicotine dependence: Secondary | ICD-10-CM

## 2017-03-15 DIAGNOSIS — D72829 Elevated white blood cell count, unspecified: Secondary | ICD-10-CM | POA: Diagnosis present

## 2017-03-15 DIAGNOSIS — J8410 Pulmonary fibrosis, unspecified: Secondary | ICD-10-CM | POA: Diagnosis not present

## 2017-03-15 DIAGNOSIS — E785 Hyperlipidemia, unspecified: Secondary | ICD-10-CM | POA: Diagnosis present

## 2017-03-15 DIAGNOSIS — Z9981 Dependence on supplemental oxygen: Secondary | ICD-10-CM

## 2017-03-15 DIAGNOSIS — Z8551 Personal history of malignant neoplasm of bladder: Secondary | ICD-10-CM

## 2017-03-15 DIAGNOSIS — K30 Functional dyspepsia: Secondary | ICD-10-CM | POA: Diagnosis present

## 2017-03-15 DIAGNOSIS — J84112 Idiopathic pulmonary fibrosis: Secondary | ICD-10-CM | POA: Diagnosis not present

## 2017-03-15 DIAGNOSIS — Z8249 Family history of ischemic heart disease and other diseases of the circulatory system: Secondary | ICD-10-CM

## 2017-03-15 DIAGNOSIS — E861 Hypovolemia: Secondary | ICD-10-CM | POA: Diagnosis present

## 2017-03-15 DIAGNOSIS — J9621 Acute and chronic respiratory failure with hypoxia: Secondary | ICD-10-CM | POA: Diagnosis not present

## 2017-03-15 DIAGNOSIS — I2721 Secondary pulmonary arterial hypertension: Secondary | ICD-10-CM | POA: Diagnosis present

## 2017-03-15 DIAGNOSIS — N183 Chronic kidney disease, stage 3 (moderate): Secondary | ICD-10-CM | POA: Diagnosis present

## 2017-03-15 DIAGNOSIS — R0902 Hypoxemia: Secondary | ICD-10-CM | POA: Diagnosis not present

## 2017-03-15 DIAGNOSIS — I129 Hypertensive chronic kidney disease with stage 1 through stage 4 chronic kidney disease, or unspecified chronic kidney disease: Secondary | ICD-10-CM | POA: Diagnosis present

## 2017-03-15 DIAGNOSIS — Z66 Do not resuscitate: Secondary | ICD-10-CM | POA: Diagnosis present

## 2017-03-15 DIAGNOSIS — Z7189 Other specified counseling: Secondary | ICD-10-CM

## 2017-03-15 DIAGNOSIS — F419 Anxiety disorder, unspecified: Secondary | ICD-10-CM | POA: Diagnosis present

## 2017-03-15 DIAGNOSIS — K5900 Constipation, unspecified: Secondary | ICD-10-CM | POA: Diagnosis present

## 2017-03-15 DIAGNOSIS — Z7982 Long term (current) use of aspirin: Secondary | ICD-10-CM

## 2017-03-15 DIAGNOSIS — F064 Anxiety disorder due to known physiological condition: Secondary | ICD-10-CM | POA: Diagnosis present

## 2017-03-15 DIAGNOSIS — I10 Essential (primary) hypertension: Secondary | ICD-10-CM | POA: Diagnosis present

## 2017-03-15 DIAGNOSIS — Z79891 Long term (current) use of opiate analgesic: Secondary | ICD-10-CM

## 2017-03-15 LAB — CBC WITH DIFFERENTIAL/PLATELET
BASOS ABS: 0 10*3/uL (ref 0.0–0.1)
Basophils Relative: 0 %
Eosinophils Absolute: 0.4 10*3/uL (ref 0.0–0.7)
Eosinophils Relative: 3 %
HEMATOCRIT: 40.9 % (ref 39.0–52.0)
Hemoglobin: 14.2 g/dL (ref 13.0–17.0)
LYMPHS PCT: 8 %
Lymphs Abs: 1 10*3/uL (ref 0.7–4.0)
MCH: 29.4 pg (ref 26.0–34.0)
MCHC: 34.7 g/dL (ref 30.0–36.0)
MCV: 84.7 fL (ref 78.0–100.0)
Monocytes Absolute: 0.8 10*3/uL (ref 0.1–1.0)
Monocytes Relative: 7 %
NEUTROS ABS: 9.6 10*3/uL — AB (ref 1.7–7.7)
Neutrophils Relative %: 82 %
Platelets: 211 10*3/uL (ref 150–400)
RBC: 4.83 MIL/uL (ref 4.22–5.81)
RDW: 12.9 % (ref 11.5–15.5)
WBC: 11.8 10*3/uL — AB (ref 4.0–10.5)

## 2017-03-15 LAB — COMPREHENSIVE METABOLIC PANEL
ALT: 15 U/L — AB (ref 17–63)
ANION GAP: 7 (ref 5–15)
AST: 33 U/L (ref 15–41)
Albumin: 2.8 g/dL — ABNORMAL LOW (ref 3.5–5.0)
Alkaline Phosphatase: 84 U/L (ref 38–126)
BUN: 20 mg/dL (ref 6–20)
CALCIUM: 8.8 mg/dL — AB (ref 8.9–10.3)
CHLORIDE: 93 mmol/L — AB (ref 101–111)
CO2: 32 mmol/L (ref 22–32)
CREATININE: 1.14 mg/dL (ref 0.61–1.24)
GFR, EST NON AFRICAN AMERICAN: 59 mL/min — AB (ref >60)
Glucose, Bld: 98 mg/dL (ref 65–99)
Potassium: 4.4 mmol/L (ref 3.5–5.1)
SODIUM: 132 mmol/L — AB (ref 135–145)
Total Bilirubin: 1.2 mg/dL (ref 0.3–1.2)
Total Protein: 6.4 g/dL — ABNORMAL LOW (ref 6.5–8.1)

## 2017-03-15 LAB — BRAIN NATRIURETIC PEPTIDE: B NATRIURETIC PEPTIDE 5: 21.8 pg/mL (ref 0.0–100.0)

## 2017-03-15 LAB — I-STAT VENOUS BLOOD GAS, ED
ACID-BASE EXCESS: 12 mmol/L — AB (ref 0.0–2.0)
Bicarbonate: 40.4 mmol/L — ABNORMAL HIGH (ref 20.0–28.0)
O2 Saturation: 45 %
TCO2: 42 mmol/L — ABNORMAL HIGH (ref 22–32)
pCO2, Ven: 65 mmHg — ABNORMAL HIGH (ref 44.0–60.0)
pH, Ven: 7.401 (ref 7.250–7.430)
pO2, Ven: 26 mmHg — CL (ref 32.0–45.0)

## 2017-03-15 LAB — TROPONIN I

## 2017-03-15 MED ORDER — METHYLPREDNISOLONE SODIUM SUCC 125 MG IJ SOLR
125.0000 mg | Freq: Once | INTRAMUSCULAR | Status: AC
  Administered 2017-03-15: 125 mg via INTRAVENOUS
  Filled 2017-03-15: qty 2

## 2017-03-15 NOTE — ED Provider Notes (Signed)
[facility] EMERGENCY DEPARTMENT Provider Note   CSN: [id number] Arrival date & time: 03/15/17  2108     History   Chief Complaint Chief Complaint  Patient presents with  . Shortness of Breath    HPI [name] [name] is a 82 y.o. male.  Patient with a history of end-stage pulmonary fibrosis, HTN, HLD presents with sudden dyspnea tonight that is worse than his baseline. He is currently on 8L home O2 (at rest) and 15L O2 (with exertion). He is followed at [facility] (Dr. [name]) and on antifibrotic therapy. Tonight he had been sitting in his recliner and got up to the bathroom. He was wearing his oxygen and states he became tachypneic and his recovery time was longer than usual. When he felt better, he went back to his recliner and experienced similar symptoms. No chest, or other, pain. He denies recent fever. He has a baseline cough that is no worse today. No nausea, vomiting, diarrhea. No congestion or sore throat.    The history is provided by the patient and the spouse.  Shortness of Breath  Associated symptoms include cough. Pertinent negatives include no fever, no rhinorrhea, no sore throat, no chest pain and no abdominal pain.    Past Medical History:  Diagnosis Date  . Bradycardia   . Esophageal reflux   . Fluttering heart   . Hx of bladder cancer 08/26/2014   Claudia Desanctis, CCRP spoke with patient wife in the presence of PI, Dr. Chase Caller.  AFFERENT cough IPF study:  Wife narrated, "bladder Cancer in 2010...states, had a small spot removed, no additional treatment required and is in complete remission"   . IPF (idiopathic pulmonary fibrosis) ([place])   . Lung nodule   . Other and unspecified hyperlipidemia   . Palpitations   . Unspecified essential hypertension     Patient Active Problem List   Diagnosis Date Noted  . Community acquired pneumonia   . Sepsis ([place])   . Acute renal failure superimposed on stage 3 chronic kidney disease ([place])   . Chronic  pain syndrome 03/06/2016  . Influenza 03/05/2016  . Hyponatremia 03/05/2016  . CKD (chronic kidney disease) stage 3, GFR 30-59 ml/min (H[facility]) 03/05/2016  . Thrombocytopenia ([place]) 03/05/2016  . Acute on chronic respiratory failure ([place]) 03/05/2016  . Hx of bladder cancer 08/26/2014  . Patient in clinical research study 07/18/2014  . Chronic respiratory failure with hypoxia ([place]) 06/17/2014  . IPF (idiopathic pulmonary fibrosis) ([place]) 06/16/2014  . Femoral hernia, left 08/27/2011  . Bilateral inguinal herniae (BIH), R>L 06/11/2011  . BRONCHITIS, ACUTE 06/08/2010  . Hyperlipidemia 06/04/2007  . Essential hypertension 06/04/2007  . G E R D 06/04/2007  . BRONCHIECTASIS 03/06/2007  . PULMONARY NODULE, RIGHT MIDDLE LOBE 03/06/2007    Past Surgical History:  Procedure Laterality Date  . CARDIOVASCULAR STRESS TEST  07/29/2009   ef 64%  . CYSTOSCOPY     removal of bladder tumor  . FEMORAL HERNIA REPAIR  07/20/2011   Procedure: HERNIA REPAIR FEMORAL;  Surgeon: Adin Hector, MD;  Location: [facility];  Service: General;  Laterality: Left;  . INGUINAL HERNIA REPAIR  07/20/2011   Procedure: LAPAROSCOPIC BILATERAL INGUINAL HERNIA REPAIR;  Surgeon: Adin Hector, MD;  Location: [facility];  Service: General;  Laterality: Bilateral;  . US ECHOCARDIOGRAPHY  07/25/2009   ef 55-60%       Home Medications    Prior to Admission medications   Medication Sig Start Date End Date Taking? Authorizing Provider  ALPRAZolam (XANAX) 0.25 MG tablet Take 1 tablet (0.25 mg total) by mouth daily as needed for anxiety. 07/19/14  Yes Brand Males, MD  aspirin 325 MG tablet Take 162.5 mg by mouth every 6 (six) hours as needed for mild pain.    Yes [provider]  atenolol (TENORMIN) 25 MG tablet Take 25 mg by mouth daily. 02/08/11  Yes Darlin Coco, MD  atorvastatin (LIPITOR) 80 MG tablet Take 80 mg by mouth daily.    Yes [provider]  feeding supplement, ENSURE ENLIVE, (ENSURE ENLIVE) LIQD  Take 237 mLs by mouth 2 (two) times daily between meals. Patient taking differently: Take 237 mLs by mouth 3 (three) times daily.  03/08/16  Yes Barton Dubois, MD  guaiFENesin (MUCINEX) 600 MG 12 hr tablet Take 1 tablet (600 mg total) by mouth 2 (two) times daily. Patient taking differently: Take 600 mg by mouth 2 (two) times daily as needed for cough or to loosen phlegm.  03/08/16  Yes Barton Dubois, MD  HYDROcodone-homatropine [facility]) 5-1.5 MG/5ML syrup Take 5 mLs by mouth See admin instructions. Every 6 hours as needed for cough. Take every evening before bed.   Yes [provider]  KLOR-CON M10 10 MEQ tablet Take 1 tablet by mouth daily. 04/02/12  Yes [provider]  morphine (MSIR) 15 MG tablet Take 15 mg by mouth at bedtime.   Yes [provider]  omeprazole (PRILOSEC) 20 MG capsule Take 20 mg by mouth daily. 30 minutes before breakfast   Yes [provider]  Pirfenidone 267 MG CAPS Take 3 tablets by mouth 3 (three) times daily. 09/01/13  Yes [provider]  tamsulosin (FLOMAX) 0.4 MG CAPS capsule Take 0.4 mg by mouth daily.   Yes [provider]  telmisartan-hydrochlorothiazide (MICARDIS HCT) 80-25 MG per tablet Take 1 tablet by mouth daily.   Yes [provider]    Family History Family History  Problem Relation Age of Onset  . COPD Mother   . Heart failure Father   . Coronary artery disease Unknown   . COPD Unknown   . Pulmonary fibrosis Brother     Social History Social History  Substance Use Topics  . Smoking status: Never Smoker  . Smokeless tobacco: Former Systems developer  . Alcohol use 1.2 oz/week    2 Cans of beer per week     Comment: occasional use     Allergies   Patient has no known allergies.   Review of Systems Review of Systems  Constitutional: Negative for chills and fever.  HENT: Negative.  Negative for congestion, rhinorrhea and sore throat.   Respiratory: Positive for cough and shortness of  breath.   Cardiovascular: Negative.  Negative for chest pain.  Gastrointestinal: Negative.  Negative for abdominal pain and nausea.  Genitourinary: Negative.   Musculoskeletal: Negative.  Negative for myalgias.  Skin: Negative.  Negative for color change.  Neurological: Positive for weakness. Negative for syncope.     Physical Exam Updated Vital Signs BP (!) 168/75 (BP Location: Right Arm)   Pulse 66   Temp 98.3 F (36.8 C) (Oral)   Resp (!) 29   SpO2 100%   Physical Exam  Constitutional: He is oriented to person, place, and time. He appears well-developed and well-nourished. No distress.  Chronically ill appearing  HENT:  Head: Normocephalic.  Eyes: Conjunctivae are normal.  Neck: Normal range of motion. Neck supple.  Cardiovascular: Normal rate and regular rhythm.   Pulmonary/Chest: Effort normal. No respiratory distress.  He has rales. He exhibits no tenderness.  Abdominal: Soft. Bowel sounds are normal. There is no tenderness. There is no rebound and no guarding.  Musculoskeletal: Normal range of motion. He exhibits no edema.  Neurological: He is alert and oriented to person, place, and time.  Skin: Skin is warm and dry. No rash noted.  Psychiatric: He has a normal mood and affect.     ED Treatments / Results  Labs (all labs ordered are listed, but only abnormal results are displayed) Labs Reviewed - No data to display  EKG  EKG Interpretation  Date/Time:  Friday March 15 2017 21:11:46 EDT Ventricular Rate:  66 PR Interval:    QRS Duration: 98 QT Interval:  395 QTC Calculation: 414 R Axis:   69 Text Interpretation:  Sinus rhythm No significant change since last tracing Confirmed by Blanchie Dessert [phone]) on 03/15/2017 9:55:54 PM       Radiology Dg Chest 2 View  Result Date: 03/15/2017 CLINICAL DATA:  Acute onset of shortness of breath. Current history of pulmonary fibrosis. Initial encounter. EXAM: CHEST  2 VIEW COMPARISON:  Chest radiograph  performed 03/05/2016 FINDINGS: The lungs are mildly hypoexpanded. Chronic bilateral fibrotic changes are similar in appearance to the prior study. No definite superimposed focal airspace consolidation is seen. No pleural effusion or pneumothorax is identified. The heart is borderline normal in size. No acute osseous abnormalities are seen. IMPRESSION: Lungs mildly hypoexpanded. Stable appearance to chronic pulmonary fibrosis. No superimposed focal airspace consolidation seen. Electronically Signed   By: Garald Balding M.D.   On: 03/15/2017 21:41    Procedures Procedures (including critical care time)  Medications Ordered in ED Medications - No data to display   Initial Impression / Assessment and Plan / ED Course  I have reviewed the triage vital signs and the nursing notes.  Pertinent labs & imaging results that were available during my care of the patient were reviewed by me and considered in my medical decision making (see chart for details).     Patient with a h/o end-stage pulmonary edema presents with DOE sharply and suddenly above baseline tonight. No pain, LE edema, fever. He has a chronic persistent cough that is no worse tonight.  Patient seen and evaluated. Labs reassuring, consistent with chronic illness. CXR shows stable fibrosis without new findings.   Attempt to walk the patient results in decrease of O2 saturation to 78% and profound DOE. The patient was ambulated at 15L O2 via [place] as per home regimen. Discussed admission with Dr. Myna Hidalgo who accepts the patient onto his service.  Final Clinical Impressions(s) / ED Diagnoses   Final diagnoses:  None   1. Dyspnea on exertion 2. End-stage pulmonary fibrosis  New Prescriptions New Prescriptions   No medications on file     Charlann Lange, Hershal Coria 03/16/17 [id number]    Blanchie Dessert, MD 03/18/17 [phone]

## 2017-03-15 NOTE — ED Triage Notes (Signed)
Per EMS, pt from home with c/o sudden onset shortness of breath upon standing. Hx pulmonary fibrosis, normally wears 8-10 L O2 via [place], LS clear, denies chest pain. Placed on non-rebreather by EMS.

## 2017-03-16 ENCOUNTER — Inpatient Hospital Stay (HOSPITAL_COMMUNITY): Payer: Medicare Other

## 2017-03-16 ENCOUNTER — Encounter (HOSPITAL_COMMUNITY): Payer: Self-pay | Admitting: Family Medicine

## 2017-03-16 DIAGNOSIS — I10 Essential (primary) hypertension: Secondary | ICD-10-CM | POA: Diagnosis not present

## 2017-03-16 DIAGNOSIS — K5900 Constipation, unspecified: Secondary | ICD-10-CM | POA: Diagnosis present

## 2017-03-16 DIAGNOSIS — Z9981 Dependence on supplemental oxygen: Secondary | ICD-10-CM | POA: Diagnosis not present

## 2017-03-16 DIAGNOSIS — Z7189 Other specified counseling: Secondary | ICD-10-CM | POA: Diagnosis not present

## 2017-03-16 DIAGNOSIS — J8410 Pulmonary fibrosis, unspecified: Secondary | ICD-10-CM | POA: Diagnosis not present

## 2017-03-16 DIAGNOSIS — J9621 Acute and chronic respiratory failure with hypoxia: Secondary | ICD-10-CM

## 2017-03-16 DIAGNOSIS — R0609 Other forms of dyspnea: Secondary | ICD-10-CM | POA: Diagnosis not present

## 2017-03-16 DIAGNOSIS — Z7982 Long term (current) use of aspirin: Secondary | ICD-10-CM | POA: Diagnosis not present

## 2017-03-16 DIAGNOSIS — I129 Hypertensive chronic kidney disease with stage 1 through stage 4 chronic kidney disease, or unspecified chronic kidney disease: Secondary | ICD-10-CM | POA: Diagnosis present

## 2017-03-16 DIAGNOSIS — I2721 Secondary pulmonary arterial hypertension: Secondary | ICD-10-CM | POA: Diagnosis present

## 2017-03-16 DIAGNOSIS — D72829 Elevated white blood cell count, unspecified: Secondary | ICD-10-CM | POA: Diagnosis present

## 2017-03-16 DIAGNOSIS — Z825 Family history of asthma and other chronic lower respiratory diseases: Secondary | ICD-10-CM | POA: Diagnosis not present

## 2017-03-16 DIAGNOSIS — Z515 Encounter for palliative care: Secondary | ICD-10-CM | POA: Diagnosis not present

## 2017-03-16 DIAGNOSIS — Z8249 Family history of ischemic heart disease and other diseases of the circulatory system: Secondary | ICD-10-CM | POA: Diagnosis not present

## 2017-03-16 DIAGNOSIS — F419 Anxiety disorder, unspecified: Secondary | ICD-10-CM | POA: Diagnosis not present

## 2017-03-16 DIAGNOSIS — K30 Functional dyspepsia: Secondary | ICD-10-CM | POA: Diagnosis present

## 2017-03-16 DIAGNOSIS — E871 Hypo-osmolality and hyponatremia: Secondary | ICD-10-CM | POA: Diagnosis not present

## 2017-03-16 DIAGNOSIS — G894 Chronic pain syndrome: Secondary | ICD-10-CM

## 2017-03-16 DIAGNOSIS — E861 Hypovolemia: Secondary | ICD-10-CM | POA: Diagnosis present

## 2017-03-16 DIAGNOSIS — Z8551 Personal history of malignant neoplasm of bladder: Secondary | ICD-10-CM | POA: Diagnosis not present

## 2017-03-16 DIAGNOSIS — Z87891 Personal history of nicotine dependence: Secondary | ICD-10-CM | POA: Diagnosis not present

## 2017-03-16 DIAGNOSIS — K219 Gastro-esophageal reflux disease without esophagitis: Secondary | ICD-10-CM | POA: Diagnosis present

## 2017-03-16 DIAGNOSIS — Z66 Do not resuscitate: Secondary | ICD-10-CM | POA: Diagnosis present

## 2017-03-16 DIAGNOSIS — F064 Anxiety disorder due to known physiological condition: Secondary | ICD-10-CM | POA: Diagnosis present

## 2017-03-16 DIAGNOSIS — N183 Chronic kidney disease, stage 3 (moderate): Secondary | ICD-10-CM | POA: Diagnosis present

## 2017-03-16 DIAGNOSIS — Z79891 Long term (current) use of opiate analgesic: Secondary | ICD-10-CM | POA: Diagnosis not present

## 2017-03-16 DIAGNOSIS — R0602 Shortness of breath: Secondary | ICD-10-CM | POA: Diagnosis not present

## 2017-03-16 DIAGNOSIS — E785 Hyperlipidemia, unspecified: Secondary | ICD-10-CM | POA: Diagnosis present

## 2017-03-16 DIAGNOSIS — Z436 Encounter for attention to other artificial openings of urinary tract: Secondary | ICD-10-CM | POA: Diagnosis not present

## 2017-03-16 DIAGNOSIS — Z79899 Other long term (current) drug therapy: Secondary | ICD-10-CM | POA: Diagnosis not present

## 2017-03-16 DIAGNOSIS — J84112 Idiopathic pulmonary fibrosis: Secondary | ICD-10-CM | POA: Diagnosis not present

## 2017-03-16 LAB — BASIC METABOLIC PANEL
Anion gap: 10 (ref 5–15)
BUN: 16 mg/dL (ref 6–20)
CO2: 29 mmol/L (ref 22–32)
CREATININE: 1.14 mg/dL (ref 0.61–1.24)
Calcium: 8.9 mg/dL (ref 8.9–10.3)
Chloride: 91 mmol/L — ABNORMAL LOW (ref 101–111)
GFR calc non Af Amer: 59 mL/min — ABNORMAL LOW (ref >60)
Glucose, Bld: 177 mg/dL — ABNORMAL HIGH (ref 65–99)
POTASSIUM: 4 mmol/L (ref 3.5–5.1)
Sodium: 130 mmol/L — ABNORMAL LOW (ref 135–145)

## 2017-03-16 LAB — CBC WITH DIFFERENTIAL/PLATELET
Basophils Absolute: 0 10*3/uL (ref 0.0–0.1)
Basophils Relative: 0 %
EOS ABS: 0 10*3/uL (ref 0.0–0.7)
Eosinophils Relative: 0 %
HEMATOCRIT: 42.5 % (ref 39.0–52.0)
HEMOGLOBIN: 14.6 g/dL (ref 13.0–17.0)
LYMPHS ABS: 0.5 10*3/uL — AB (ref 0.7–4.0)
LYMPHS PCT: 4 %
MCH: 28.7 pg (ref 26.0–34.0)
MCHC: 34.4 g/dL (ref 30.0–36.0)
MCV: 83.7 fL (ref 78.0–100.0)
MONOS PCT: 0 %
Monocytes Absolute: 0 10*3/uL — ABNORMAL LOW (ref 0.1–1.0)
NEUTROS ABS: 10.2 10*3/uL — AB (ref 1.7–7.7)
NEUTROS PCT: 96 %
Platelets: 188 10*3/uL (ref 150–400)
RBC: 5.08 MIL/uL (ref 4.22–5.81)
RDW: 12.7 % (ref 11.5–15.5)
WBC: 10.7 10*3/uL — AB (ref 4.0–10.5)

## 2017-03-16 LAB — PROCALCITONIN: Procalcitonin: <0.1 ng/mL

## 2017-03-16 MED ORDER — ASPIRIN 325 MG PO TABS
162.5000 mg | ORAL_TABLET | Freq: Every day | ORAL | Status: DC
  Administered 2017-03-16 – 2017-03-17 (×2): 162.5 mg via ORAL
  Filled 2017-03-16 (×2): qty 1

## 2017-03-16 MED ORDER — MORPHINE SULFATE 15 MG PO TABS: 15.0000 mg | ORAL_TABLET | Freq: Every day | ORAL | Status: DC

## 2017-03-16 MED ORDER — HYDROCODONE-HOMATROPINE 5-1.5 MG/5ML PO SYRP: 5.0000 mL | ORAL_SOLUTION | ORAL | Status: DC

## 2017-03-16 MED ORDER — IRBESARTAN 150 MG PO TABS
300.0000 mg | ORAL_TABLET | Freq: Every day | ORAL | Status: DC
  Administered 2017-03-16 – 2017-03-19 (×4): 300 mg via ORAL
  Filled 2017-03-16 (×2): qty 2
  Filled 2017-03-16: qty 1
  Filled 2017-03-16 (×2): qty 2

## 2017-03-16 MED ORDER — SODIUM CHLORIDE 0.9% FLUSH
3.0000 mL | Freq: Two times a day (BID) | INTRAVENOUS | Status: DC
  Administered 2017-03-16 – 2017-03-26 (×20): 3 mL via INTRAVENOUS

## 2017-03-16 MED ORDER — IOPAMIDOL (ISOVUE-370) INJECTION 76%
INTRAVENOUS | Status: AC
  Administered 2017-03-16: 100 mL
  Filled 2017-03-16: qty 100

## 2017-03-16 MED ORDER — ONDANSETRON HCL 4 MG PO TABS: 4.0000 mg | ORAL_TABLET | Freq: Four times a day (QID) | ORAL | Status: DC | PRN

## 2017-03-16 MED ORDER — MORPHINE SULFATE 15 MG PO TABS
15.0000 mg | ORAL_TABLET | Freq: Every day | ORAL | Status: DC
  Administered 2017-03-16: 15 mg via ORAL

## 2017-03-16 MED ORDER — ONDANSETRON HCL 4 MG/2ML IJ SOLN: 4.0000 mg | Freq: Four times a day (QID) | INTRAMUSCULAR | Status: DC | PRN

## 2017-03-16 MED ORDER — ENSURE ENLIVE PO LIQD
237.0000 mL | Freq: Three times a day (TID) | ORAL | Status: DC
  Administered 2017-03-16 – 2017-03-23 (×14): 237 mL via ORAL

## 2017-03-16 MED ORDER — LEVOFLOXACIN IN D5W 750 MG/150ML IV SOLN
750.0000 mg | INTRAVENOUS | Status: DC
  Administered 2017-03-16 – 2017-03-17 (×2): 750 mg via INTRAVENOUS
  Filled 2017-03-16 (×2): qty 150

## 2017-03-16 MED ORDER — METHYLPREDNISOLONE SODIUM SUCC 125 MG IJ SOLR
60.0000 mg | Freq: Four times a day (QID) | INTRAMUSCULAR | Status: DC
  Administered 2017-03-16 – 2017-03-20 (×17): 60 mg via INTRAVENOUS
  Filled 2017-03-16 (×17): qty 2

## 2017-03-16 MED ORDER — PANTOPRAZOLE SODIUM 40 MG PO TBEC
40.0000 mg | DELAYED_RELEASE_TABLET | Freq: Every day | ORAL | Status: DC
  Administered 2017-03-16 – 2017-03-19 (×4): 40 mg via ORAL
  Filled 2017-03-16 (×4): qty 1

## 2017-03-16 MED ORDER — ATORVASTATIN CALCIUM 80 MG PO TABS
80.0000 mg | ORAL_TABLET | Freq: Every day | ORAL | Status: DC
  Administered 2017-03-16 (×2): 80 mg via ORAL
  Filled 2017-03-16 (×2): qty 1

## 2017-03-16 MED ORDER — BISACODYL 5 MG PO TBEC
5.0000 mg | DELAYED_RELEASE_TABLET | Freq: Every day | ORAL | Status: DC | PRN
  Filled 2017-03-16: qty 1

## 2017-03-16 MED ORDER — HYDROCODONE-HOMATROPINE 5-1.5 MG/5ML PO SYRP
5.0000 mL | ORAL_SOLUTION | Freq: Once | ORAL | Status: DC
  Filled 2017-03-16: qty 5

## 2017-03-16 MED ORDER — SODIUM CHLORIDE 0.9 % IV SOLN
INTRAVENOUS | Status: DC
  Administered 2017-03-16: 03:00:00 via INTRAVENOUS

## 2017-03-16 MED ORDER — HYDRALAZINE HCL 20 MG/ML IJ SOLN: 10.0000 mg | INTRAMUSCULAR | Status: DC | PRN

## 2017-03-16 MED ORDER — TELMISARTAN-HCTZ 80-25 MG PO TABS: 1.0000 | ORAL_TABLET | Freq: Every day | ORAL | Status: DC

## 2017-03-16 MED ORDER — HYDROCHLOROTHIAZIDE 25 MG PO TABS
25.0000 mg | ORAL_TABLET | Freq: Every day | ORAL | Status: DC
  Administered 2017-03-16 – 2017-03-19 (×4): 25 mg via ORAL
  Filled 2017-03-16 (×4): qty 1

## 2017-03-16 MED ORDER — SENNOSIDES-DOCUSATE SODIUM 8.6-50 MG PO TABS: 1.0000 | ORAL_TABLET | Freq: Every evening | ORAL | Status: DC | PRN

## 2017-03-16 MED ORDER — ENOXAPARIN SODIUM 40 MG/0.4ML ~~LOC~~ SOLN
40.0000 mg | Freq: Every day | SUBCUTANEOUS | Status: DC
  Administered 2017-03-16 – 2017-03-17 (×2): 40 mg via SUBCUTANEOUS
  Filled 2017-03-16 (×2): qty 0.4

## 2017-03-16 MED ORDER — ACETAMINOPHEN 650 MG RE SUPP: 650.0000 mg | Freq: Four times a day (QID) | RECTAL | Status: DC | PRN

## 2017-03-16 MED ORDER — ALPRAZOLAM 0.25 MG PO TABS
0.2500 mg | ORAL_TABLET | Freq: Every day | ORAL | Status: DC | PRN
  Administered 2017-03-16 – 2017-03-17 (×2): 0.25 mg via ORAL
  Filled 2017-03-16 (×2): qty 1

## 2017-03-16 MED ORDER — ACETAMINOPHEN 325 MG PO TABS
650.0000 mg | ORAL_TABLET | Freq: Four times a day (QID) | ORAL | Status: DC | PRN
  Filled 2017-03-16: qty 2

## 2017-03-16 MED ORDER — ATENOLOL 25 MG PO TABS
25.0000 mg | ORAL_TABLET | Freq: Every day | ORAL | Status: DC
  Administered 2017-03-16 – 2017-03-19 (×4): 25 mg via ORAL
  Filled 2017-03-16 (×4): qty 1

## 2017-03-16 MED ORDER — PIRFENIDONE 267 MG PO CAPS
3.0000 | ORAL_CAPSULE | Freq: Three times a day (TID) | ORAL | Status: DC
  Administered 2017-03-16 – 2017-03-17 (×3): 3 via ORAL
  Filled 2017-03-16 (×8): qty 1

## 2017-03-16 MED ORDER — TAMSULOSIN HCL 0.4 MG PO CAPS
0.4000 mg | ORAL_CAPSULE | Freq: Every day | ORAL | Status: DC
Start: 2017-03-16 — End: 2017-03-27
  Administered 2017-03-16 – 2017-03-26 (×11): 0.4 mg via ORAL
  Filled 2017-03-16 (×11): qty 1

## 2017-03-16 MED ORDER — GUAIFENESIN ER 600 MG PO TB12: 600.0000 mg | ORAL_TABLET | Freq: Two times a day (BID) | ORAL | Status: DC | PRN

## 2017-03-16 MED ORDER — HYDROCODONE-HOMATROPINE 5-1.5 MG/5ML PO SYRP
5.0000 mL | ORAL_SOLUTION | Freq: Four times a day (QID) | ORAL | Status: DC | PRN
  Administered 2017-03-16 – 2017-03-24 (×8): 5 mL via ORAL
  Filled 2017-03-16 (×8): qty 5

## 2017-03-16 MED ORDER — MORPHINE SULFATE 15 MG PO TABS
15.0000 mg | ORAL_TABLET | Freq: Every day | ORAL | Status: DC
  Administered 2017-03-16 (×2): 15 mg via ORAL
  Filled 2017-03-16 (×2): qty 1

## 2017-03-16 NOTE — ED Notes (Signed)
Pt given coke and peanut butter crackers

## 2017-03-16 NOTE — H&P (Signed)
History and Physical    [name] [name] WCH:[id number] DOB: Nov 12, 1934 DOA: 03/15/2017  PCP: [name], MD   Patient coming from: Home  Chief Complaint: SOB  HPI: MATTHEO [name] is a 82 y.o. male with medical history significant for hypertension, chronic pain, and idiopathic pulmonary fibrosis, now presenting to the emergency department with acute worsening in his chronic dyspnea. Patient reports being fairly stable over the past few weeks, using 8 Lpm O2 at rest and 15 Lpm with activity.  Tonight, he became acutely short of breath ambulating a few steps to the toilet.  It took him a while to recover to the point where he could walk a couple steps back to his wheelchair, but again this minimal exertion left him acutely short of breath and gasping for air. He reports feeling "okay" while resting, but any minimal activity leaves him in acute distress. No recent fevers or chills and no increase in chronic cough.   ED Course: Upon arrival to the ED, patient is found to be afebrile, saturating adequately on nonrebreather with 15 L/min supplemental oxygen, tachypneic to 30, and modestly hypertensive.  EKG features a normal sinus rhythm and chest x-ray features mild hyperexpansion and stable appearance to chronic pulmonary fibrosis without any superimposed acute process identified.  Chemistry panel is notable for chronic hyponatremia with sodium of 132 and CBC features a mild leukocytosis to 11,800.  Patient was treated with 125 mg of IV Solu-Medrol in the ED and breather.  He attempted to ambulate a few steps but his O2 saturation dropped down to 78%.  He will be admitted to the stepdown unit for ongoing evaluation and management of acute on chronic hypoxic respiratory failure, possibly secondary to acute exacerbation and process.  Review of Systems:  All other systems reviewed and apart from HPI, are negative.  Past Medical History:  Diagnosis Date  . Bradycardia   . Esophageal reflux     . Fluttering heart   . Hx of bladder cancer 08/26/2014   Claudia Desanctis, CCRP spoke with patient wife in the presence of PI, Dr. Chase Caller.  AFFERENT cough IPF study:  Wife narrated, "bladder Cancer in 2010...states, had a small spot removed, no additional treatment required and is in complete remission"   . IPF (idiopathic pulmonary fibrosis) ([place])   . Lung nodule   . Other and unspecified hyperlipidemia   . Palpitations   . Unspecified essential hypertension     Past Surgical History:  Procedure Laterality Date  . CARDIOVASCULAR STRESS TEST  07/29/2009   ef 64%  . CYSTOSCOPY     removal of bladder tumor  . FEMORAL HERNIA REPAIR  07/20/2011   Procedure: HERNIA REPAIR FEMORAL;  Surgeon: Adin Hector, MD;  Location: [facility];  Service: General;  Laterality: Left;  . INGUINAL HERNIA REPAIR  07/20/2011   Procedure: LAPAROSCOPIC BILATERAL INGUINAL HERNIA REPAIR;  Surgeon: Adin Hector, MD;  Location: [facility];  Service: General;  Laterality: Bilateral;  . US ECHOCARDIOGRAPHY  07/25/2009   ef 55-60%     reports that he has never smoked. He has quit using smokeless tobacco. He reports that he drinks about 1.2 oz of alcohol per week . He reports that he does not use drugs.  No Known Allergies  Family History  Problem Relation Age of Onset  . COPD Mother   . Heart failure Father   . Coronary artery disease Unknown   . COPD Unknown   . Pulmonary fibrosis Brother  Prior to Admission medications   Medication Sig Start Date End Date Taking? Authorizing Provider  ALPRAZolam (XANAX) 0.25 MG tablet Take 1 tablet (0.25 mg total) by mouth daily as needed for anxiety. 07/19/14  Yes Brand Males, MD  aspirin 325 MG tablet Take 162.5 mg by mouth every 6 (six) hours as needed for mild pain.    Yes [provider]  atenolol (TENORMIN) 25 MG tablet Take 25 mg by mouth daily. 02/08/11  Yes Darlin Coco, MD  atorvastatin (LIPITOR) 80 MG tablet Take 80 mg by mouth daily.    Yes  [provider]  feeding supplement, ENSURE ENLIVE, (ENSURE ENLIVE) LIQD Take 237 mLs by mouth 2 (two) times daily between meals. Patient taking differently: Take 237 mLs by mouth 3 (three) times daily.  03/08/16  Yes Barton Dubois, MD  guaiFENesin (MUCINEX) 600 MG 12 hr tablet Take 1 tablet (600 mg total) by mouth 2 (two) times daily. Patient taking differently: Take 600 mg by mouth 2 (two) times daily as needed for cough or to loosen phlegm.  03/08/16  Yes Barton Dubois, MD  HYDROcodone-homatropine [facility]) 5-1.5 MG/5ML syrup Take 5 mLs by mouth See admin instructions. Every 6 hours as needed for cough. Take every evening before bed.   Yes [provider]  KLOR-CON M10 10 MEQ tablet Take 1 tablet by mouth daily. 04/02/12  Yes [provider]  morphine (MSIR) 15 MG tablet Take 15 mg by mouth at bedtime.   Yes [provider]  omeprazole (PRILOSEC) 20 MG capsule Take 20 mg by mouth daily. 30 minutes before breakfast   Yes [provider]  Pirfenidone 267 MG CAPS Take 3 tablets by mouth 3 (three) times daily. 09/01/13  Yes [provider]  tamsulosin (FLOMAX) 0.4 MG CAPS capsule Take 0.4 mg by mouth daily.   Yes [provider]  telmisartan-hydrochlorothiazide (MICARDIS HCT) 80-25 MG per tablet Take 1 tablet by mouth daily.   Yes [provider]    Physical Exam: Vitals:   03/15/17 2230 03/15/17 2300 03/15/17 2320 03/16/17 0039  BP: (!) 145/78 (!) 178/80 (!) 155/80   Pulse: 63 72 60 85  Resp: (!) 23 (!) 24 (!) 27 (!) 32  Temp:      TempSrc:      SpO2: 100% 96% 100% 94%      Constitutional: Not in acute distress, calm, mild tachypnea  Eyes: PERTLA, lids and conjunctivae normal ENMT: Mucous membranes are moist. Posterior pharynx clear of any exudate or lesions.   Neck: normal, supple, no masses, no thyromegaly Respiratory: Diminished bilaterally with fine rales throughout, no wheezing. Mild tachypnea and dyspnea  with rest. No pallor or cyanosis.   Cardiovascular: S1 & S2 heard, regular rate and rhythm. No extremity edema. No significant JVD. Abdomen: No distension, no tenderness, no masses palpated. Bowel sounds normal.  Musculoskeletal: no clubbing / cyanosis. No joint deformity upper and lower extremities.   Skin: no significant rashes, lesions, ulcers. Poor turgor. Neurologic: CN 2-12 grossly intact. Sensation intact. Strength 5/5 in all 4 limbs.  Psychiatric: Alert and oriented x 3. Calm, cooperative.     Labs on Admission: I have personally reviewed following labs and imaging studies  CBC:  Recent Labs Lab 03/15/17 2257  WBC 11.8*  NEUTROABS 9.6*  HGB 14.2  HCT 40.9  MCV 84.7  PLT 086   Basic Metabolic Panel:  Recent Labs Lab 03/15/17 2257  NA 132*  K 4.4  CL 93*  CO2 32  GLUCOSE  98  BUN 20  CREATININE 1.14  CALCIUM 8.8*   GFR: CrCl cannot be calculated (Unknown ideal weight.). Liver Function Tests:  Recent Labs Lab 03/15/17 2257  AST 33  ALT 15*  ALKPHOS 84  BILITOT 1.2  PROT 6.4*  ALBUMIN 2.8*   No results for input(s): LIPASE, AMYLASE in the last 168 hours. No results for input(s): AMMONIA in the last 168 hours. Coagulation Profile: No results for input(s): INR, PROTIME in the last 168 hours. Cardiac Enzymes:  Recent Labs Lab 03/15/17 2257  TROPONINI <0.03   BNP (last 3 results) No results for input(s): PROBNP in the last 8760 hours. HbA1C: No results for input(s): HGBA1C in the last 72 hours. CBG: No results for input(s): GLUCAP in the last 168 hours. Lipid Profile: No results for input(s): CHOL, HDL, LDLCALC, TRIG, CHOLHDL, LDLDIRECT in the last 72 hours. Thyroid Function Tests: No results for input(s): TSH, T4TOTAL, FREET4, T3FREE, THYROIDAB in the last 72 hours. Anemia Panel: No results for input(s): VITAMINB12, FOLATE, FERRITIN, TIBC, IRON, RETICCTPCT in the last 72 hours. Urine analysis:    Component Value Date/Time   COLORURINE  YELLOW 07/20/2011 2234   APPEARANCEUR CLOUDY (A) 07/20/2011 2234   LABSPEC 1.027 07/20/2011 2234   PHURINE 6.0 07/20/2011 2234   GLUCOSEU NEGATIVE 07/20/2011 2234   HGBUR NEGATIVE 07/20/2011 2234   BILIRUBINUR NEGATIVE 07/20/2011 2234   KETONESUR NEGATIVE 07/20/2011 2234   PROTEINUR NEGATIVE 07/20/2011 2234   UROBILINOGEN 0.2 07/20/2011 2234   NITRITE NEGATIVE 07/20/2011 2234   LEUKOCYTESUR NEGATIVE 07/20/2011 2234   Sepsis Labs: @LABRCNTIP (procalcitonin:4,lacticidven:4) )No results found for this or any previous visit (from the past 240 hour(s)).   Radiological Exams on Admission: Dg Chest 2 View  Result Date: 03/15/2017 CLINICAL DATA:  Acute onset of shortness of breath. Current history of pulmonary fibrosis. Initial encounter. EXAM: CHEST  2 VIEW COMPARISON:  Chest radiograph performed 03/05/2016 FINDINGS: The lungs are mildly hypoexpanded. Chronic bilateral fibrotic changes are similar in appearance to the prior study. No definite superimposed focal airspace consolidation is seen. No pleural effusion or pneumothorax is identified. The heart is borderline normal in size. No acute osseous abnormalities are seen. IMPRESSION: Lungs mildly hypoexpanded. Stable appearance to chronic pulmonary fibrosis. No superimposed focal airspace consolidation seen. Electronically Signed   By: Garald Balding M.D.   On: 03/15/2017 21:41    EKG: Independently reviewed. Normal sinus rhythm.   Assessment/Plan  1. Acute on chronic hypoxic respiratory failure  - Pt presents with acute respiratory distress after minimal exertion, noted in ED to drop sat to 78% on 15 Lpm after ambulating a few steps  - No fevers, but slight leukocytosis, no conspicuous consolidation on CXR, no evidence for VTE or CHF  - Treated in ED with 125 mg IV Solu-Medrol  - Plan to monitor in SDU initially with prn BiPAP, continue systemic steroid, check sputum culture, strep pneumo and legionella urinary antigens, trend procalcitonin,  chest CT, Levaquin    2. Idiopathic pulmonary fibrosis  - Follows with pulmonology and managed with pirfenidone and supplemental O2  - Pulmonologist has discussed hospice with patient and his family  - Continue supplemental O2, PPI, and pirfenidone, continue steroid, abx as above    3. Chronic hyponatremia  - Serum sodium is 132, similar to priors  - He does appears hypovolemic on admission will be given a gentle IVF hydration  - Repeat chem panel in am   4. Hypertension  - BP at goal  - Continue atenolol as tolerated  5. Chronic pain  - Stable, no pain complaint on admission  - Continue home regimen with Hycodan and MSIR   6. Anxiety  - Continue prn Xanax    DVT prophylaxis: Lovenox Code Status: DNR, BiPAP okay  Family Communication: Wife and son updated at bedside Disposition Plan: Admit to SDU Consults called: None Admission status: Inpatient    Vianne Bulls, MD [facility] Pager [phone]  If 7PM-7AM, please contact night-coverage [email or web address] Password [email or web address]  03/16/2017, 1:34 AM

## 2017-03-16 NOTE — Consult Note (Signed)
PULMONARY / CRITICAL CARE MEDICINE   Name: [name] [name] MRN: [id number] DOB: 25-Nov-1934 PCP [name], MD LOS 0 as of 03/16/2017     ADMISSION DATE:  03/15/2017 CONSULTATION DATE:  03/16/2017   REFERRING MD:  Dr Wendee Beavers  CHIEF COMPLAINT:  Possible IPF flare  HISTORY OF PRESENT ILLNESS:   82 year old male with idiopathic pulmonary fibrosis. He has had idiopathic pulmonary fibrosis for many years out living normal life expectancy median survival of 3-5 years. He generally follows at [facility] although initially he used to be followed at our practice. In between he is followed with [place] for participation in research trials. He is also an active participant in the pulmonary fibrosis foundation support group [place] chapter. Therefore he is well-known to me. I personally have not seen him in a few years in the clinic setting and in several months and the foundation support group. He tells me that in the last few years he's had slow worsening of dyspnea and significant worsening of cough [his cough is severe and a predominant feature of his IPF]. Most recently he saw Dr. [name] at [facility] in September 2018 and his oxygen was up titrated to 8 L at rest and 15 L with rest. He takes Pirfenidone (Esbriet) for his IPF and his most recent liver function test this admission is normal. He tells me that yesterday he went to the bathroom and then became extremely dyspneic found. More desaturating than usual and therefore presented to the emergency department. Currently he reports class IV dyspnea and extreme difficulty even transferring out of the bed to the toilet on the side of the bed. He desaturates into the 80s on 15 L oxygen if he does this. There is no fever or sputum or viral symptoms or edema or paroxysmal nocturnal dyspnea or hemoptysis. He is not on anticoagulation  PAST MEDICAL HISTORY :  He  has a past medical history of Bradycardia; Esophageal reflux; Fluttering heart; bladder cancer  (08/26/2014); IPF (idiopathic pulmonary fibrosis) ([place]); Lung nodule; Other and unspecified hyperlipidemia; Palpitations; and Unspecified essential hypertension.  PAST SURGICAL HISTORY: He  has a past surgical history that includes Cystoscopy; US ECHOCARDIOGRAPHY (07/25/2009); Cardiovascular stress test (07/29/2009); Inguinal hernia repair (07/20/2011); and Femoral hernia repair (07/20/2011).  No Known Allergies  No current facility-administered medications on file prior to encounter.    Current Outpatient Prescriptions on File Prior to Encounter  Medication Sig  . ALPRAZolam (XANAX) 0.25 MG tablet Take 1 tablet (0.25 mg total) by mouth daily as needed for anxiety.  [name] aspirin 325 MG tablet Take 162.5 mg by mouth every 6 (six) hours as needed for mild pain.   [name] atenolol (TENORMIN) 25 MG tablet Take 25 mg by mouth daily.  [name] atorvastatin (LIPITOR) 80 MG tablet Take 80 mg by mouth daily.   . feeding supplement, ENSURE ENLIVE, (ENSURE ENLIVE) LIQD Take 237 mLs by mouth 2 (two) times daily between meals. (Patient taking differently: Take 237 mLs by mouth 3 (three) times daily. )  . guaiFENesin (MUCINEX) 600 MG 12 hr tablet Take 1 tablet (600 mg total) by mouth 2 (two) times daily. (Patient taking differently: Take 600 mg by mouth 2 (two) times daily as needed for cough or to loosen phlegm. )  . HYDROcodone-homatropine (HYCODAN) 5-1.5 MG/5ML syrup Take 5 mLs by mouth See admin instructions. Every 6 hours as needed for cough. Take every evening before bed.  [name] KLOR-CON M10 10 MEQ tablet Take 1 tablet by mouth daily.  [name] omeprazole (PRILOSEC)  20 MG capsule Take 20 mg by mouth daily. 30 minutes before breakfast  . Pirfenidone 267 MG CAPS Take 3 tablets by mouth 3 (three) times daily.  . tamsulosin (FLOMAX) 0.4 MG CAPS capsule Take 0.4 mg by mouth daily.  [name] telmisartan-hydrochlorothiazide (MICARDIS HCT) 80-25 MG per tablet Take 1 tablet by mouth daily.    FAMILY HISTORY:  His indicated that his mother is  deceased. He indicated that his father is deceased. He indicated that the status of his brother is unknown. He indicated that the status of his unknown relative is unknown.    SOCIAL HISTORY: He  reports that he has never smoked. He has quit using smokeless tobacco. He reports that he drinks about 1.2 oz of alcohol per week . He reports that he does not use drugs.  REVIEW OF SYSTEMS:   As per history of present illness otherwise unelicitable   VITAL SIGNS: BP (!) 141/76   Pulse 89   Temp 97.6 F (36.4 C) (Oral)   Resp (!) 28   Wt 72.8 kg (160 lb 8 oz)   SpO2 99%   BMI 25.14 kg/m   HEMODYNAMICS:    VENTILATOR SETTINGS:    INTAKE / OUTPUT: No intake/output data recorded.     EXAM  General Appearance:    Looks very deconditioned   Head:    Normocephalic, without obvious abnormality, atraumatic  Eyes:    PERRL - yes, conjunctiva/corneas - clear      Ears:    Normal external ear canals, both ears  Nose:   NG tube - no   Throat:  ETT TUBE - no , OG tube - no   Neck:   Supple,  No enlargement/tenderness/nodules     Lungs:     Tachypneic with a respiratory rate of 30. Some difficulty completing sentences. Bilateral extensive crackles   Chest wall:    No deformity  Heart:    S1 and S2 normal, no murmur, CVP - no.  Pressors - no   Abdomen:     Soft, no masses, no organomegaly  Genitalia:    Not done  Rectal:   not done  Extremities:   Extremities- intact      Skin:   Intact in exposed areas . Sacral area - no reports of decub      Neurologic:   Sedation - none -> RASS - +1 . Moves all 4s - yes. CAM-ICU - negative for delirium . Orientation - fully oriented        LABS  PULMONARY  Recent Labs Lab 03/15/17 2311  HCO3 40.4*  TCO2 42*  O2SAT 45.0    CBC  Recent Labs Lab 03/15/17 2257 03/16/17 0428  HGB 14.2 14.6  HCT 40.9 42.5  WBC 11.8* 10.7*  PLT 211 188    COAGULATION No results for input(s): INR in the last 168 hours.  CARDIAC   Recent  Labs Lab 03/15/17 2257  TROPONINI <0.03   No results for input(s): PROBNP in the last 168 hours.   CHEMISTRY  Recent Labs Lab 03/15/17 2257 03/16/17 0428  NA 132* 130*  K 4.4 4.0  CL 93* 91*  CO2 32 29  GLUCOSE 98 177*  BUN 20 16  CREATININE 1.14 1.14  CALCIUM 8.8* 8.9   CrCl cannot be calculated (Unknown ideal weight.).   LIVER  Recent Labs Lab 03/15/17 2257  AST 33  ALT 15*  ALKPHOS 84  BILITOT 1.2  PROT 6.4*  ALBUMIN 2.8*  INFECTIOUS  Recent Labs Lab 03/16/17 0428  PROCALCITON <0.10     ENDOCRINE CBG (last 3)  No results for input(s): GLUCAP in the last 72 hours.       IMAGING x48h  - image(s) personally visualized  -   highlighted in bold Dg Chest 2 View  Result Date: 03/15/2017 CLINICAL DATA:  Acute onset of shortness of breath. Current history of pulmonary fibrosis. Initial encounter. EXAM: CHEST  2 VIEW COMPARISON:  Chest radiograph performed 03/05/2016 FINDINGS: The lungs are mildly hypoexpanded. Chronic bilateral fibrotic changes are similar in appearance to the prior study. No definite superimposed focal airspace consolidation is seen. No pleural effusion or pneumothorax is identified. The heart is borderline normal in size. No acute osseous abnormalities are seen. IMPRESSION: Lungs mildly hypoexpanded. Stable appearance to chronic pulmonary fibrosis. No superimposed focal airspace consolidation seen. Electronically Signed   By: Garald Balding M.D.   On: 03/15/2017 21:41   Ct Chest High Resolution  Result Date: 03/16/2017 CLINICAL DATA:  Worsening shortness of breath over the last couple days. Cough. History of pulmonary fibrosis. EXAM: CT CHEST WITHOUT CONTRAST TECHNIQUE: Multidetector CT imaging of the chest was performed following the standard protocol without intravenous contrast. High resolution imaging of the lungs, as well as inspiratory and expiratory imaging, was performed. COMPARISON:  Radiographs 03/15/2017 and 03/05/2016.  Chest CT 07/05/2008. A more recent chest CT performed 10/12/2010 unavailable. FINDINGS: Cardiovascular: Mild atherosclerosis of aorta, great vessels and coronary arteries. There is central enlargement of the pulmonary arteries consistent with pulmonary arterial hypertension. No acute vascular findings are demonstrated on noncontrast imaging. The heart size is normal. There is no pericardial effusion. Mediastinum/Nodes: There are several mildly enlarged mediastinal and hilar lymph nodes, including a 14 mm right paratracheal node on image 44, a 14 mm low right paratracheal node on image 53 and a 10 mm subcarinal node on image 76. Hilar assessment is limited without contrast. No axillary adenopathy. A moderate size hiatal hernia has enlarged compared with the prior study. Lungs/Pleura: No pleural effusion or pneumothorax. There are progressive changes of severe pulmonary fibrosis with subpleural reticulation, fissural thickening, traction bronchiectasis, architectural distortion and extensive honeycomb formation in both lungs. 4 mm right middle lobe nodule on image 74 is stable. There is a small calcified right upper lobe granuloma. There are no new or enlarging pulmonary nodules. There is no focal airspace disease or ground-glass opacity. Inspiration and expiration imaging demonstrates no significant air trapping. Upper abdomen: The visualized upper abdomen appears stable without acute findings. Musculoskeletal/Chest wall: There is no chest wall mass or suspicious osseous finding. IMPRESSION: 1. Progressive changes of severe pulmonary fibrosis (usual interstitial pneumonitis pattern) compared with available chest CT from 2010. 2. No definite acute superimposed findings or suspicious pulmonary nodules. 3. Mildly prominent mediastinal and hilar lymph nodes are nonspecific, likely reactive. 4. Central enlargement of the pulmonary arteries, consistent with pulmonary arterial hypertension. Aortic and coronary artery  atherosclerosis. 5. Moderate size hiatal hernia. Electronically Signed   By: Richardean Sale M.D.   On: 03/16/2017 09:24       ASSESSMENT and PLAN  Acute on chronic respiratory failure with hypoxia [facility])   He clearly has progressive disease based on history and also CT scan. However most likely acute  differential diagnosis is pulmonary embolism which is seen at high frequency in IPF admissions. The other alternative is IPF flare.  Plan/recommendations - Agree with oxygen to keep pulse ox greater than 88% - IV steroids  - CT angiogram chest  to rule out pulmonary embolism  - Check respiratory virus panel - We'll continue to follow - Morphine for dyspnea and cough   - If this ends up being IPF flare then the 3 month mortality is 90%. Therefore DO NOT RESUSCITATE is appropriate. We would then support concurrent palliation along with discussions about hospice because he is hospice eligible  Discussed with referring M.D. Dr. Wendee Beavers       FAMILY  - Updates: 03/16/2017 --> wife, son and patient a bedside and referring MD Dr Wendee Beavers  - Inter-disciplinary family meet or Palliative Care meeting due by:  DAy 7. Current LOS is LOS 0 days  CODE STATUS    Code Status Orders        Start     Ordered   03/16/17 0126  Do not attempt resuscitation (DNR)  Continuous    Question Answer Comment  In the event of cardiac or respiratory ARREST Do not call a "code blue"   In the event of cardiac or respiratory ARREST Do not perform Intubation, CPR, defibrillation or ACLS   In the event of cardiac or respiratory ARREST Use medication by any route, position, wound care, and other measures to relive pain and suffering. May use oxygen, suction and manual treatment of airway obstruction as needed for comfort.      03/16/17 0133    Code Status History    Date Active Date Inactive Code Status Order ID Comments User Context   03/05/2016  9:17 PM 03/08/2016  9:31 PM DNR [id number]  Karmen Bongo, MD ED         DISPO Keep in floor      Dr. Brand Males, M.D., F.C.C.P Pulmonary and Critical Care Medicine Staff Physician [facility] Pager: [phone], If no answer or between  15:00h - 7:00h: call [phone]  03/16/2017 3:56 PM

## 2017-03-16 NOTE — Assessment & Plan Note (Signed)
He clearly has progressive disease based on history and also CT scan. However most likely acute  differential diagnosis is pulmonary embolism which is seen at high frequency in IPF admissions. The other alternative is IPF flare.  Plan/recommendations - Agree with oxygen to keep pulse ox greater than 88% - IV steroids  - CT angiogram chest to rule out pulmonary embolism  - Check respiratory virus panel - We'll continue to follow - Morphine for dyspnea and cough   - If this ends up being IPF flare then the 3 month mortality is 90%. Therefore DO NOT RESUSCITATE is appropriate. We would then support concurrent palliation along with discussions about hospice because he is hospice eligible  Discussed with referring M.D. Dr. Wendee Beavers

## 2017-03-16 NOTE — ED Notes (Signed)
Pt ambulated with RN and EMT while on 15L O2 via [place] and monitoring Spo2 Beginning Spo2: 100% While walking: 88% Returned to bed: 78% O2 returned to baseline after resting in bed a few minutes

## 2017-03-16 NOTE — ED Notes (Signed)
Attempted report x 1 to 6E 

## 2017-03-16 NOTE — Progress Notes (Signed)
Patient seen and evaluated earlier this am by my associate. Please refer to H and P for details regarding assessment and plan.   Pt feels better. He is requesting pulmonology to round on him. As such will reach out as given his IPF I this this is reasonable.  Will reassess next am.  Jmya Uliano  Gen: pt in and CV: no cyanosis Pulm: Increased RR, [place] in place, equal chest rise.

## 2017-03-17 DIAGNOSIS — J84112 Idiopathic pulmonary fibrosis: Principal | ICD-10-CM

## 2017-03-17 DIAGNOSIS — Z515 Encounter for palliative care: Secondary | ICD-10-CM

## 2017-03-17 LAB — RESPIRATORY PANEL BY PCR
ADENOVIRUS-RVPPCR: NOT DETECTED
BORDETELLA PERTUSSIS-RVPCR: NOT DETECTED
CHLAMYDOPHILA PNEUMONIAE-RVPPCR: NOT DETECTED
CORONAVIRUS NL63-RVPPCR: NOT DETECTED
Coronavirus 229E: NOT DETECTED
Coronavirus HKU1: NOT DETECTED
Coronavirus OC43: NOT DETECTED
INFLUENZA A-RVPPCR: NOT DETECTED
Influenza B: NOT DETECTED
Metapneumovirus: NOT DETECTED
Mycoplasma pneumoniae: NOT DETECTED
PARAINFLUENZA VIRUS 3-RVPPCR: NOT DETECTED
PARAINFLUENZA VIRUS 4-RVPPCR: NOT DETECTED
Parainfluenza Virus 1: NOT DETECTED
Parainfluenza Virus 2: NOT DETECTED
RHINOVIRUS / ENTEROVIRUS - RVPPCR: NOT DETECTED
Respiratory Syncytial Virus: NOT DETECTED

## 2017-03-17 LAB — PROCALCITONIN: Procalcitonin: <0.1 ng/mL

## 2017-03-17 LAB — GLUCOSE, CAPILLARY
GLUCOSE-CAPILLARY: 137 mg/dL — AB (ref 65–99)
Glucose-Capillary: 131 mg/dL — ABNORMAL HIGH (ref 65–99)
Glucose-Capillary: 196 mg/dL — ABNORMAL HIGH (ref 65–99)

## 2017-03-17 LAB — EXPECTORATED SPUTUM ASSESSMENT W GRAM STAIN, RFLX TO RESP C

## 2017-03-17 LAB — STREP PNEUMONIAE URINARY ANTIGEN: Strep Pneumo Urinary Antigen: NEGATIVE

## 2017-03-17 LAB — EXPECTORATED SPUTUM ASSESSMENT W REFEX TO RESP CULTURE

## 2017-03-17 MED ORDER — LORAZEPAM 2 MG/ML IJ SOLN: 0.5000 mg | INTRAMUSCULAR | Status: DC | PRN

## 2017-03-17 MED ORDER — TEMAZEPAM 15 MG PO CAPS
15.0000 mg | ORAL_CAPSULE | Freq: Every evening | ORAL | Status: DC | PRN
  Administered 2017-03-18 – 2017-03-20 (×4): 15 mg via ORAL
  Filled 2017-03-17 (×4): qty 1

## 2017-03-17 MED ORDER — ALPRAZOLAM 0.25 MG PO TABS
0.2500 mg | ORAL_TABLET | Freq: Three times a day (TID) | ORAL | Status: DC | PRN
  Administered 2017-03-18: 0.25 mg via ORAL
  Filled 2017-03-17: qty 1

## 2017-03-17 MED ORDER — MORPHINE SULFATE (PF) 2 MG/ML IV SOLN: 2.0000 mg | INTRAVENOUS | Status: DC | PRN

## 2017-03-17 MED ORDER — MORPHINE SULFATE (CONCENTRATE) 10 MG/0.5ML PO SOLN
5.0000 mg | Freq: Three times a day (TID) | ORAL | Status: DC
  Administered 2017-03-17: 5 mg via ORAL
  Filled 2017-03-17: qty 0.5

## 2017-03-17 MED ORDER — TEMAZEPAM 15 MG PO CAPS: 15.0000 mg | ORAL_CAPSULE | Freq: Every day | ORAL | Status: DC

## 2017-03-17 MED ORDER — MORPHINE SULFATE (CONCENTRATE) 10 MG/0.5ML PO SOLN
10.0000 mg | ORAL | Status: DC | PRN
  Administered 2017-03-18 – 2017-03-19 (×3): 10 mg via ORAL
  Filled 2017-03-17 (×3): qty 0.5

## 2017-03-17 MED ORDER — MORPHINE SULFATE 10 MG/5ML PO SOLN: 5.0000 mg | ORAL | Status: DC | PRN

## 2017-03-17 MED ORDER — ALPRAZOLAM 0.25 MG PO TABS
0.2500 mg | ORAL_TABLET | Freq: Three times a day (TID) | ORAL | Status: DC | PRN
  Administered 2017-03-17: 0.25 mg via ORAL
  Filled 2017-03-17: qty 1

## 2017-03-17 MED ORDER — SENNOSIDES-DOCUSATE SODIUM 8.6-50 MG PO TABS
2.0000 | ORAL_TABLET | Freq: Every evening | ORAL | Status: DC | PRN
  Administered 2017-03-19: 2 via ORAL
  Filled 2017-03-17: qty 2

## 2017-03-17 MED ORDER — LEVOFLOXACIN 750 MG PO TABS
750.0000 mg | ORAL_TABLET | Freq: Every day | ORAL | Status: DC
  Administered 2017-03-17 – 2017-03-19 (×3): 750 mg via ORAL
  Filled 2017-03-17 (×3): qty 1

## 2017-03-17 MED ORDER — MORPHINE SULFATE (CONCENTRATE) 10 MG/0.5ML PO SOLN
10.0000 mg | Freq: Four times a day (QID) | ORAL | Status: DC
  Administered 2017-03-17 – 2017-03-26 (×14): 10 mg via ORAL
  Filled 2017-03-17 (×21): qty 0.5

## 2017-03-17 MED ORDER — ALPRAZOLAM 0.25 MG PO TABS
0.2500 mg | ORAL_TABLET | Freq: Three times a day (TID) | ORAL | Status: DC
  Administered 2017-03-17 – 2017-03-26 (×14): 0.25 mg via ORAL
  Filled 2017-03-17 (×15): qty 1

## 2017-03-17 MED ORDER — ALUM & MAG HYDROXIDE-SIMETH 200-200-20 MG/5ML PO SUSP
30.0000 mL | Freq: Four times a day (QID) | ORAL | Status: DC | PRN
  Administered 2017-03-17 (×2): 30 mL via ORAL
  Filled 2017-03-17 (×2): qty 30

## 2017-03-17 NOTE — Consult Note (Signed)
Consultation Note Date: 03/17/2017   Patient Name: [name] [name]  DOB: 1934/09/21  MRN: [id number]  Age / Sex: 82 y.o., male  PCP: [name], MD Referring Physician: Velvet Bathe, MD  Reason for Consultation: Establishing goals of care, Hospice Evaluation, Non pain symptom management and Psychosocial/spiritual support  HPI/Patient Profile: 82 y.o. male  with past medical history of interstitial pulmonary fibrosis, hyperlipidemia, hypertension, cough, admitted on 03/15/2017 with worsening shortness of breath.  Patient has been living with interstitial pulmonary fibrosis for approximately 9 years and has been included in clinical trials at [facility].  His his disease has progressed to the point where he is afraid to fall asleep at night.  He is unable to lie flat.  He has been on morphine 15 mg at bedtime for approximately 1 year.  Additionally he has taken Xanax 0.25 mg for approximately 6 months, typically twice daily.  Spouse shares that sometimes she has had to "cheat" and give him 1-1/2 tablets because of severe anxiety.  Patient reports prior to admission that morphine 15 mg at bedtime was no longer managing his symptoms nor was it sedating.   In addition to symptom management consult for residential hospice evaluation  Clinical Assessment and Goals of Care: Met with patient, his wife [name], and sons Gerald Stabs, and Patrick.  Patient shares that he would like to just be "sleepier, so I can rest".  Wife however shares concern about patient being sedated, and her not being able to talk to him.  We spent a great deal of time today talking about opioids in general, appropriate prescribing in the setting of end-stage pulmonary fibrosis, shortness of breath.  Patient is not drug nave and has been on morphine as well as Xanax for a number of months and appears to have habituated to these doses.  He tells me that Xanax  0.25 used to provide relief from anxiety and would only intermittently take the medication and is now requiring potentially 1-1/2 tablets to give any relief from anxiety associated with dyspnea.  Also in terms of morphine, he describes a history of 15 mg at bedtime providing rest, sleep and alleviating his fear of going to sleep.  This does however no longer makes him feel sleepy.  His wife does concur that when she gives him 15 mg at bedtime he does not appear oversedated.  Both share that medications are not lasting as long as they once did  Patient at this point is decisional.  His healthcare proxy would be his wife Keyshawn Hellwig in the event he were unable to speak for himself.  They do appear to be making decisions as a family including their son's Gerald Stabs, and Cecilie Lowers    SUMMARY OF RECOMMENDATIONS   Confirmed DNR/DNI Liberalize morphine as well as Xanax both scheduled as well as as needed Continue steroids.  Did review with patient and family that this also could be contributing to worsening anxiety and  Continue Hycodan syrup as needed.  Severe dry nonproductive cough is a key component to  his pulmonary fibrosis Palliative medicine to round in a.m. for symptom management and will begin discussions at that time regarding hospice, either in home versus residential Introduce the concept of a continuous infusion of morphine but patient and his family were reluctant to start with this and would prefer to increase oral dosing for now  Code Status/Advance Care Planning:  DNR    Symptom Management:   Dyspnea: As 15 mg of oral morphine is no longer sedating to patient, will start 10 mg of morphine concentrate every 6 hours on a scheduled basis and 5-10 mg every 3 hours as needed for breakthrough shortness of breath, cough.  Continue with Solu-Medrol every 6 hours.  Will order morphine IV 2-4 mg every 3 hours as needed for severe dyspnea, if patient is unable to take oral medication.  Hold for  sedation  Anxiety secondary to dyspnea: We will schedule Xanax 0.25 mg every 8 hours, and Xanax 0.25-0.5 mg every 8 hours as needed for breakthrough anxiety.  Patient may be experiencing worsening anxiety than he was at home because of addition of steroids.  Will order Ativan 0.5 mg IV every 4 hours as needed for acute anxiety if patient is unable to take oral Xanax.  Hold for sedation  Cough: Continue Hycodan 5/1.5, 5 mL's every 6 hours as needed for cough.  Scheduled morphine should also be helping with this.  Continue with Mucinex 600 mg twice daily  Constipation: We will increase senna to 2 tablets at bedtime and continue with Dulcolax EC p.o. 5 mg daily  Palliative Prophylaxis:   Aspiration, Bowel Regimen, Delirium Protocol, Eye Care, Frequent Pain Assessment, Oral Care and Turn Reposition  Additional Recommendations (Limitations, Scope, Preferences):  Avoid Hospitalization, Minimize Medications, Initiate Comfort Feeding, No Artificial Feeding, No Blood Transfusions, No Chemotherapy, No Hemodialysis, No Radiation, No Surgical Procedures and No Tracheostomy  Psycho-social/Spiritual:   Desire for further Chaplaincy support:no  Additional Recommendations: Grief/Bereavement Support  Prognosis:   Unable to determine  Discharge Planning: To Be Determined      Primary Diagnoses: Present on Admission: . Essential hypertension . IPF (idiopathic pulmonary fibrosis) ([place]) . Hyponatremia . Acute on chronic respiratory failure with hypoxia ([place]) . Chronic pain syndrome . Anxiety   I have reviewed the medical record, interviewed the patient and family, and examined the patient. The following aspects are pertinent.  Past Medical History:  Diagnosis Date  . Bradycardia   . Esophageal reflux   . Fluttering heart   . Hx of bladder cancer 08/26/2014   Claudia Desanctis, CCRP spoke with patient wife in the presence of PI, Dr. Chase Caller.  AFFERENT cough IPF study:  Wife narrated, "bladder  Cancer in 2010...states, had a small spot removed, no additional treatment required and is in complete remission"   . IPF (idiopathic pulmonary fibrosis) ([place])   . Lung nodule   . Other and unspecified hyperlipidemia   . Palpitations   . Unspecified essential hypertension    Social History   Social History  . Marital status: Married    Spouse name: N/A  . Number of children: N/A  . Years of education: N/A   Occupational History  . Retired    Social History Main Topics  . Smoking status: Never Smoker  . Smokeless tobacco: Former Systems developer  . Alcohol use 1.2 oz/week    2 Cans of beer per week     Comment: occasional use  . Drug use: No  . Sexual activity: Not Asked   Other Topics Concern  .  None   Social History Narrative  . None   Family History  Problem Relation Age of Onset  . COPD Mother   . Heart failure Father   . Coronary artery disease Unknown   . COPD Unknown   . Pulmonary fibrosis Brother    Scheduled Meds: . ALPRAZolam  0.25 mg Oral TID  . aspirin  162.5 mg Oral Daily  . atenolol  25 mg Oral Daily  . atorvastatin  80 mg Oral q1800  . enoxaparin (LOVENOX) injection  40 mg Subcutaneous Daily  . feeding supplement (ENSURE ENLIVE)  237 mL Oral TID  . irbesartan  300 mg Oral Daily   And  . hydrochlorothiazide  25 mg Oral Daily  . HYDROcodone-homatropine  5 mL Oral Once  . levofloxacin  750 mg Oral Q2200  . methylPREDNISolone (SOLU-MEDROL) injection  60 mg Intravenous Q6H  . morphine CONCENTRATE  10 mg Oral Q6H  . pantoprazole  40 mg Oral Daily  . sodium chloride flush  3 mL Intravenous Q12H  . tamsulosin  0.4 mg Oral Daily   Continuous Infusions: PRN Meds:.acetaminophen **OR** acetaminophen, ALPRAZolam, alum & mag hydroxide-simeth, bisacodyl, guaiFENesin, hydrALAZINE, HYDROcodone-homatropine, LORazepam, morphine injection, morphine CONCENTRATE, ondansetron **OR** ondansetron (ZOFRAN) IV, senna-docusate, temazepam Medications Prior to Admission:  Prior to  Admission medications   Medication Sig Start Date End Date Taking? Authorizing Provider  ALPRAZolam (XANAX) 0.25 MG tablet Take 1 tablet (0.25 mg total) by mouth daily as needed for anxiety. 07/19/14  Yes Brand Males, MD  aspirin 325 MG tablet Take 162.5 mg by mouth every 6 (six) hours as needed for mild pain.    Yes [provider]  atenolol (TENORMIN) 25 MG tablet Take 25 mg by mouth daily. 02/08/11  Yes Darlin Coco, MD  atorvastatin (LIPITOR) 80 MG tablet Take 80 mg by mouth daily.    Yes [provider]  feeding supplement, ENSURE ENLIVE, (ENSURE ENLIVE) LIQD Take 237 mLs by mouth 2 (two) times daily between meals. Patient taking differently: Take 237 mLs by mouth 3 (three) times daily.  03/08/16  Yes Barton Dubois, MD  guaiFENesin (MUCINEX) 600 MG 12 hr tablet Take 1 tablet (600 mg total) by mouth 2 (two) times daily. Patient taking differently: Take 600 mg by mouth 2 (two) times daily as needed for cough or to loosen phlegm.  03/08/16  Yes Barton Dubois, MD  HYDROcodone-homatropine [facility]) 5-1.5 MG/5ML syrup Take 5 mLs by mouth See admin instructions. Every 6 hours as needed for cough. Take every evening before bed.   Yes [provider]  KLOR-CON M10 10 MEQ tablet Take 1 tablet by mouth daily. 04/02/12  Yes [provider]  morphine (MSIR) 15 MG tablet Take 15 mg by mouth at bedtime.   Yes [provider]  omeprazole (PRILOSEC) 20 MG capsule Take 20 mg by mouth daily. 30 minutes before breakfast   Yes [provider]  Pirfenidone 267 MG CAPS Take 3 tablets by mouth 3 (three) times daily. 09/01/13  Yes [provider]  tamsulosin (FLOMAX) 0.4 MG CAPS capsule Take 0.4 mg by mouth daily.   Yes [provider]  telmisartan-hydrochlorothiazide (MICARDIS HCT) 80-25 MG per tablet Take 1 tablet by mouth daily.   Yes [provider]   No Known Allergies Review of Systems  Unable to perform ROS: Severe  respiratory distress    Physical Exam  Constitutional: He is oriented to person, place, and time.  Acutely ill-appearing elderly man ;short of breath at rest  HENT:  Head: Normocephalic and atraumatic.  Neck: Normal range of motion.  Pulmonary/Chest:  Increased work of breathing at rest Respiratory rate 28-30 currently after receiving morphine Dry, nonproductive intermittent cough  Musculoskeletal: Normal range of motion.  Neurological: He is alert and oriented to person, place, and time.  Skin: Skin is warm and dry. There is pallor.  Psychiatric: His behavior is normal. Judgment and thought content normal.  Anxious secondary to dyspnea  Nursing note and vitals reviewed.   Vital Signs: BP 136/65   Pulse 95   Temp 97.7 F (36.5 C) (Oral)   Resp (!) 25   Wt 72.2 kg (159 lb 3.2 oz)   SpO2 91%   BMI 24.93 kg/m  Pain Assessment: No/denies pain   Pain Score: 0-No pain   SpO2: SpO2: 91 % O2 Device:SpO2: 91 % O2 Flow Rate: .O2 Flow Rate (L/min): 10 L/min  IO: Intake/output summary:  Intake/Output Summary (Last 24 hours) at 03/17/17 1649 Last data filed at 03/17/17 1100  Gross per 24 hour  Intake              860 ml  Output              775 ml  Net               85 ml    LBM: Last BM Date: 03/16/17 Baseline Weight: Weight: 72.8 kg (160 lb 8 oz) Most recent weight: Weight: 72.2 kg (159 lb 3.2 oz)     Palliative Assessment/Data:   Flowsheet Rows     Most Recent Value  Intake Tab  Referral Department  Critical care  Unit at Time of Referral  Med/Surg Unit  Palliative Care Primary Diagnosis  Pulmonary  Date Notified  03/17/17  Palliative Care Type  New Palliative care  Reason for referral  Non-pain Symptom, Clarify Goals of Care, Counsel Regarding Hospice, Psychosocial or Spiritual support  Date of Admission  03/15/17  Date first seen by Palliative Care  03/17/17  # of days Palliative referral response time  0 Day(s)  # of days IP prior to Palliative referral  2    Clinical Assessment  Palliative Performance Scale Score  30%  Pain Max last 24 hours  Not able to report  Pain Min Last 24 hours  Not able to report  Dyspnea Max Last 24 Hours  Not able to report  Dyspnea Min Last 24 hours  Not able to report  Nausea Max Last 24 Hours  Not able to report  Nausea Min Last 24 Hours  Not able to report  Anxiety Max Last 24 Hours  Not able to report  Anxiety Min Last 24 Hours  Not able to report  Other Max Last 24 Hours  Not able to report  Psychosocial & Spiritual Assessment  Palliative Care Outcomes  Patient/Family meeting held?  Yes  Who was at the meeting?  pt, wife and 2 sons  Palliative Care Outcomes  Improved non-pain symptom therapy, Clarified goals of care, Counseled regarding hospice  Patient/Family wishes: Interventions discontinued/not started   Mechanical Ventilation  Palliative Care follow-up planned  Yes, Facility      Time In: 1550 Time Out: 1700 Time Total: 70 min Greater than 50%  of this time was spent counseling and coordinating care related to the above assessment and plan.  Signed by: Dory Horn, NP   Please contact Palliative Medicine Team phone at [phone] for questions and concerns.  For individual provider: See Shea Evans

## 2017-03-17 NOTE — Progress Notes (Signed)
Name: [name] [name] MRN: [id number] DOB: Jul 04, 1934    ADMISSION DATE:  03/15/2017 CONSULTATION DATE:  03/16/17  REFERRING MD :  Dr. Wendee Beavers / [facility]   CHIEF COMPLAINT:  Dyspnea    BRIEF SUMMARY:  82 year old male with idiopathic pulmonary fibrosis. He has had idiopathic pulmonary fibrosis for many years, out living normal life expectancy median survival of 3-5 years. He generally follows at [facility] although initially he used to be followed at [facility]. In between he has followed with [place] for participation in research trials. He is also an active participant in the pulmonary fibrosis foundation support group [facility] chapter.  He states that in the last few years he's had slow worsening of dyspnea and significant worsening of cough [his cough is severe and a predominant feature of his IPF]. Most recently he saw Dr. [name] at [facility] in September 2018 and his oxygen was up titrated to 8 L at rest and 15 L with rest. He takes Pirfenidone (Esbriet) for his IPF and his most recent liver function test this admission is normal. He presented to the ER for worsening dyspnea with hypoxia for evaluation.    SUBJECTIVE: Remains on 10L HFNC.  Pt reports he is more dyspneic.  Gets winded easily.    VITAL SIGNS: Temp:  [97.7 F (36.5 C)-98.1 F (36.7 C)] 97.7 F (36.5 C) (10/28 0900) Pulse Rate:  [73-95] 95 (10/28 0900) Resp:  [19-30] 25 (10/28 0942) BP: (134-152)/(65-87) 136/65 (10/28 0942) SpO2:  [91 %-99 %] 91 % (10/28 0900) Weight:  [159 lb 3.2 oz (72.2 kg)] 159 lb 3.2 oz (72.2 kg) (10/28 0418)  PHYSICAL EXAMINATION: General: chronically ill appearing adult male lying in bed HEENT: MM pink/moist PSY: anxious Neuro: AAOx4, speech clear, MAE CV: s1s2 rrr, no m/r/g PULM: tachypnea, lungs bilaterally diminished, basilar crackles AS:NKNL, non-tender, bsx4 active  Extremities: warm/dry, no edema  Skin: no rashes or lesions   Recent Labs Lab 03/15/17 2257 03/16/17 0428  NA 132*  130*  K 4.4 4.0  CL 93* 91*  CO2 32 29  BUN 20 16  CREATININE 1.14 1.14  GLUCOSE 98 177*     Recent Labs Lab 03/15/17 2257 03/16/17 0428  HGB 14.2 14.6  HCT 40.9 42.5  WBC 11.8* 10.7*  PLT 211 188    Dg Chest 2 View  Result Date: 03/15/2017 CLINICAL DATA:  Acute onset of shortness of breath. Current history of pulmonary fibrosis. Initial encounter. EXAM: CHEST  2 VIEW COMPARISON:  Chest radiograph performed 03/05/2016 FINDINGS: The lungs are mildly hypoexpanded. Chronic bilateral fibrotic changes are similar in appearance to the prior study. No definite superimposed focal airspace consolidation is seen. No pleural effusion or pneumothorax is identified. The heart is borderline normal in size. No acute osseous abnormalities are seen. IMPRESSION: Lungs mildly hypoexpanded. Stable appearance to chronic pulmonary fibrosis. No superimposed focal airspace consolidation seen. Electronically Signed   By: Garald Balding M.D.   On: 03/15/2017 21:41   Ct Angio Chest Pe W Or Wo Contrast  Result Date: 03/16/2017 CLINICAL DATA:  Sudden onset shortness of breath EXAM: CT ANGIOGRAPHY CHEST WITH CONTRAST TECHNIQUE: Multidetector CT imaging of the chest was performed using the standard protocol during bolus administration of intravenous contrast. Multiplanar CT image reconstructions and MIPs were obtained to evaluate the vascular anatomy. CONTRAST:  71 mL Isovue 370 intravenous COMPARISON:  CT 03/16/2017, 07/05/2008 FINDINGS: Cardiovascular: Satisfactory opacification of the pulmonary arteries to the segmental level. Limited evaluation for emboli within the lower lobes, due to  significant respiratory motion artifact. Allowing for this, no definite acute filling defects to suggest pulmonary embolus. Prominent central pulmonary vessels suggesting pulmonary hypertension. No aneurysmal dilatation of the aorta. Atherosclerotic calcifications. Common origin of the right brachiocephalic and left common carotid  artery. Minimal coronary calcification. Normal heart size. No pericardial effusion. Mediastinum/Nodes: Midline trachea. No thyroid mass. Re- demonstrated slightly enlarged mediastinal lymph nodes, for example pretracheal lymph node measures 13 mm. Moderate hiatal hernia. Lungs/Pleura: Re- demonstrated extensive pulmonary fibrosis with honeycombing and bronchiectasis. Right middle lobe pulmonary nodule more difficult to visualize due to respiratory motion. No pleural effusion, consolidation or pneumothorax Upper Abdomen: No acute interval changes.  Atrophic left kidney. Musculoskeletal: No chest wall abnormality. No acute or significant osseous findings. Review of the MIP images confirms the above findings. IMPRESSION: 1. Limited evaluation for emboli in the lower lobes secondary to respiratory motion. Allowing for this, no definite acute pulmonary embolus is visualized. 2. Prominent central pulmonary arteries consistent with pulmonary hypertension 3. Extensive pulmonary fibrosis as detailed on chest CT 03/16/2017. 4. Re- demonstrated mild mediastinal adenopathy 5. Atrophic left kidney Aortic Atherosclerosis (ICD10-I70.0). Electronically Signed   By: Donavan Foil M.D.   On: 03/16/2017 19:15   Ct Chest High Resolution  Result Date: 03/16/2017 CLINICAL DATA:  Worsening shortness of breath over the last couple days. Cough. History of pulmonary fibrosis. EXAM: CT CHEST WITHOUT CONTRAST TECHNIQUE: Multidetector CT imaging of the chest was performed following the standard protocol without intravenous contrast. High resolution imaging of the lungs, as well as inspiratory and expiratory imaging, was performed. COMPARISON:  Radiographs 03/15/2017 and 03/05/2016. Chest CT 07/05/2008. A more recent chest CT performed 10/12/2010 unavailable. FINDINGS: Cardiovascular: Mild atherosclerosis of aorta, great vessels and coronary arteries. There is central enlargement of the pulmonary arteries consistent with pulmonary arterial  hypertension. No acute vascular findings are demonstrated on noncontrast imaging. The heart size is normal. There is no pericardial effusion. Mediastinum/Nodes: There are several mildly enlarged mediastinal and hilar lymph nodes, including a 14 mm right paratracheal node on image 44, a 14 mm low right paratracheal node on image 53 and a 10 mm subcarinal node on image 76. Hilar assessment is limited without contrast. No axillary adenopathy. A moderate size hiatal hernia has enlarged compared with the prior study. Lungs/Pleura: No pleural effusion or pneumothorax. There are progressive changes of severe pulmonary fibrosis with subpleural reticulation, fissural thickening, traction bronchiectasis, architectural distortion and extensive honeycomb formation in both lungs. 4 mm right middle lobe nodule on image 74 is stable. There is a small calcified right upper lobe granuloma. There are no new or enlarging pulmonary nodules. There is no focal airspace disease or ground-glass opacity. Inspiration and expiration imaging demonstrates no significant air trapping. Upper abdomen: The visualized upper abdomen appears stable without acute findings. Musculoskeletal/Chest wall: There is no chest wall mass or suspicious osseous finding. IMPRESSION: 1. Progressive changes of severe pulmonary fibrosis (usual interstitial pneumonitis pattern) compared with available chest CT from 2010. 2. No definite acute superimposed findings or suspicious pulmonary nodules. 3. Mildly prominent mediastinal and hilar lymph nodes are nonspecific, likely reactive. 4. Central enlargement of the pulmonary arteries, consistent with pulmonary arterial hypertension. Aortic and coronary artery atherosclerosis. 5. Moderate size hiatal hernia. Electronically Signed   By: Richardean Sale M.D.   On: 03/16/2017 09:24    SIGNIFICANT EVENTS  10/26  Admit with increasing dyspnea   STUDIES:  CTA Chest 10/27 >> progressive changes of severe pulmonary fibrosis  (UIP pattern), no PE, mildly prominent mediastinal &  hilar LAN  CULTURES:  RVP 10/27 >> negative    ASSESSMENT / PLAN:  Discussion: 82 y/o M with known IPF who was admitted for worsening dyspnea and hypoxia.  Concern for acute IPF flare.  PE ruled out.  Feel this mostly likely represents end stage disease.    Acute on Chronic Hypoxic Respiratory Failure - PE negative, RVP negative.  No acute reversible processes identified at present.  End-Stage IPF   Plan: O2 to support sats > 88%  Morphine for dyspnea and cough Liberalize xanax  PRN liquid morphine QHS restoril for sleep  PRN hycodan for cough   IV solumedrol 60 mg IV Q6 PPI QD  Continue pirfenidone  Agree with DNR/DNI status Supportive care  Global - discussion with family / Dr. Chase Caller regarding goals of care.  Currently, he would like to be placed at a facility for comfort care.  They feel he would be better served and less anxious with inpatient support.  Will ask palliative care to review of symptoms and placement recommendations.   Noe Gens, NP-C [facility] Pgr: [phone] or if no answer [phone] 03/17/2017, 1:44 PM

## 2017-03-17 NOTE — Progress Notes (Signed)
PHARMACIST - PHYSICIAN COMMUNICATION CONCERNING: Antibiotic IV to Oral Route Change Policy  RECOMMENDATION: This patient is receiving levofloxacin by the intravenous route.  Based on criteria approved by the Pharmacy and Therapeutics Committee, the antibiotic(s) is/are being converted to the equivalent oral dose form(s).  DESCRIPTION: These criteria include:  Patient being treated for a respiratory tract infection, urinary tract infection, cellulitis or clostridium difficile associated diarrhea if on metronidazole  The patient is not neutropenic and does not exhibit a GI malabsorption state  The patient is eating (either orally or via tube) and/or has been taking other orally administered medications for a least 24 hours  The patient is improving clinically and has a Tmax < 100.5  If you have questions about this conversion, please contact the Pharmacy Department  []   [phone] )  [facility] [x]   [phone] )  [facility]  []   [phone] )  [facility] []   [phone] )  Lynd. Gerarda Fraction, PharmD PGY1 Pharmacy Resident Pager: [phone] 03/17/17 9:31 AM

## 2017-03-17 NOTE — Progress Notes (Signed)
Called Dr Maudie Mercury re: patient's medication that he takes at home. Pt takes Morphine IR 15 mg at bedtime to assist with comfort for sleep and also has Hycodan for cough, asked for those two things at patient and family's request. Dr Wendee Beavers paged regarding results of the CT scan, no PE seen, will await call back. Spent significant time with patient and family using pursed lip breathing during his anxious moments and also ordered a small fan for his bedside table for comfort, will continue to monitor. Pt has a dry, hacky cough but not really brining up anything at this time, will get a specimen when able.

## 2017-03-17 NOTE — Progress Notes (Signed)
PROGRESS NOTE    [name] [name]  JME:[id number] DOB: 10/16/1934 DOA: 03/15/2017 PCP: [name], MD    Brief Narrative:  82 y/o with history of IPF who is presenting with IPF flair. Per pulmonology patient has days to weeks left and plan is for comfort care.   Assessment & Plan:   IPF - comfort care measures recommended after evaluation by pulmonologist. Pt is agreeable. Palliative care team consulted  DVT prophylaxis: Lovenox Code Status: DNR Family Communication: d/c patient and spouse Disposition Plan: pending improvement in condition   Consultants:   Palliative care   Procedures: none   Antimicrobials: Levaquin   Subjective: Pt has no new complaints  Objective: Vitals:   03/17/17 0418 03/17/17 0800 03/17/17 0900 03/17/17 0942  BP: 134/70  (!) 149/87 136/65  Pulse: 73 87 95   Resp: 20 (!) 25 (!) 30 (!) 25  Temp: 98.1 F (36.7 C)  97.7 F (36.5 C)   TempSrc: Oral  Oral   SpO2: 99% 98% 91%   Weight: 72.2 kg (159 lb 3.2 oz)       Intake/Output Summary (Last 24 hours) at 03/17/17 1712 Last data filed at 03/17/17 1100  Gross per 24 hour  Intake              620 ml  Output              775 ml  Net             -155 ml   Filed Weights   03/16/17 0644 03/17/17 0418  Weight: 72.8 kg (160 lb 8 oz) 72.2 kg (159 lb 3.2 oz)    Examination:  General exam: Appears calm and comfortable, in nad. Respiratory system: rhales, no wheezes, equal chest rise, tachypneic Cardiovascular system: S1 & S2 heard, RRR. No JVD, murmurs, rubs Gastrointestinal system: Abdomen is nondistended, soft and nontender. No organomegaly or masses felt. Normal bowel sounds heard. Central nervous system: Alert and oriented. No facial asymmetry Extremities: moves extremities equally, no cyanosis at extremities Skin: No rashes, lesions or ulcers, on limited exam. Psychiatry: Mood & affect appropriate.    Data Reviewed: I have personally reviewed following labs and imaging  studies  CBC:  Recent Labs Lab 03/15/17 2257 03/16/17 0428  WBC 11.8* 10.7*  NEUTROABS 9.6* 10.2*  HGB 14.2 14.6  HCT 40.9 42.5  MCV 84.7 83.7  PLT 211 229   Basic Metabolic Panel:  Recent Labs Lab 03/15/17 2257 03/16/17 0428  NA 132* 130*  K 4.4 4.0  CL 93* 91*  CO2 32 29  GLUCOSE 98 177*  BUN 20 16  CREATININE 1.14 1.14  CALCIUM 8.8* 8.9   GFR: CrCl cannot be calculated (Unknown ideal weight.). Liver Function Tests:  Recent Labs Lab 03/15/17 2257  AST 33  ALT 15*  ALKPHOS 84  BILITOT 1.2  PROT 6.4*  ALBUMIN 2.8*   No results for input(s): LIPASE, AMYLASE in the last 168 hours. No results for input(s): AMMONIA in the last 168 hours. Coagulation Profile: No results for input(s): INR, PROTIME in the last 168 hours. Cardiac Enzymes:  Recent Labs Lab 03/15/17 2257  TROPONINI <0.03   BNP (last 3 results) No results for input(s): PROBNP in the last 8760 hours. HbA1C: No results for input(s): HGBA1C in the last 72 hours. CBG:  Recent Labs Lab 03/17/17 0457 03/17/17 0728 03/17/17 1127  GLUCAP 137* 131* 196*   Lipid Profile: No results for input(s): CHOL, HDL, LDLCALC, TRIG, CHOLHDL, LDLDIRECT in the  last 72 hours. Thyroid Function Tests: No results for input(s): TSH, T4TOTAL, FREET4, T3FREE, THYROIDAB in the last 72 hours. Anemia Panel: No results for input(s): VITAMINB12, FOLATE, FERRITIN, TIBC, IRON, RETICCTPCT in the last 72 hours. Sepsis Labs:  Recent Labs Lab 03/16/17 0428 03/17/17 0446  PROCALCITON <0.10 <0.10    Recent Results (from the past 240 hour(s))  Respiratory Panel by PCR     Status: None   Collection Time: 03/16/17  5:38 AM  Result Value Ref Range Status   Adenovirus NOT DETECTED NOT DETECTED Final   Coronavirus 229E NOT DETECTED NOT DETECTED Final   Coronavirus HKU1 NOT DETECTED NOT DETECTED Final   Coronavirus NL63 NOT DETECTED NOT DETECTED Final   Coronavirus OC43 NOT DETECTED NOT DETECTED Final   Metapneumovirus  NOT DETECTED NOT DETECTED Final   Rhinovirus / Enterovirus NOT DETECTED NOT DETECTED Final   Influenza A NOT DETECTED NOT DETECTED Final   Influenza B NOT DETECTED NOT DETECTED Final   Parainfluenza Virus 1 NOT DETECTED NOT DETECTED Final   Parainfluenza Virus 2 NOT DETECTED NOT DETECTED Final   Parainfluenza Virus 3 NOT DETECTED NOT DETECTED Final   Parainfluenza Virus 4 NOT DETECTED NOT DETECTED Final   Respiratory Syncytial Virus NOT DETECTED NOT DETECTED Final   Bordetella pertussis NOT DETECTED NOT DETECTED Final   Chlamydophila pneumoniae NOT DETECTED NOT DETECTED Final   Mycoplasma pneumoniae NOT DETECTED NOT DETECTED Final  Culture, sputum-assessment     Status: None   Collection Time: 03/16/17  6:10 AM  Result Value Ref Range Status   Specimen Description EXPECTORATED SPUTUM  Final   Special Requests NONE  Final   Sputum evaluation THIS SPECIMEN IS ACCEPTABLE FOR SPUTUM CULTURE  Final   Report Status 03/17/2017 FINAL  Final         Radiology Studies: Dg Chest 2 View  Result Date: 03/15/2017 CLINICAL DATA:  Acute onset of shortness of breath. Current history of pulmonary fibrosis. Initial encounter. EXAM: CHEST  2 VIEW COMPARISON:  Chest radiograph performed 03/05/2016 FINDINGS: The lungs are mildly hypoexpanded. Chronic bilateral fibrotic changes are similar in appearance to the prior study. No definite superimposed focal airspace consolidation is seen. No pleural effusion or pneumothorax is identified. The heart is borderline normal in size. No acute osseous abnormalities are seen. IMPRESSION: Lungs mildly hypoexpanded. Stable appearance to chronic pulmonary fibrosis. No superimposed focal airspace consolidation seen. Electronically Signed   By: Garald Balding M.D.   On: 03/15/2017 21:41   Ct Angio Chest Pe W Or Wo Contrast  Result Date: 03/16/2017 CLINICAL DATA:  Sudden onset shortness of breath EXAM: CT ANGIOGRAPHY CHEST WITH CONTRAST TECHNIQUE: Multidetector CT imaging  of the chest was performed using the standard protocol during bolus administration of intravenous contrast. Multiplanar CT image reconstructions and MIPs were obtained to evaluate the vascular anatomy. CONTRAST:  71 mL Isovue 370 intravenous COMPARISON:  CT 03/16/2017, 07/05/2008 FINDINGS: Cardiovascular: Satisfactory opacification of the pulmonary arteries to the segmental level. Limited evaluation for emboli within the lower lobes, due to significant respiratory motion artifact. Allowing for this, no definite acute filling defects to suggest pulmonary embolus. Prominent central pulmonary vessels suggesting pulmonary hypertension. No aneurysmal dilatation of the aorta. Atherosclerotic calcifications. Common origin of the right brachiocephalic and left common carotid artery. Minimal coronary calcification. Normal heart size. No pericardial effusion. Mediastinum/Nodes: Midline trachea. No thyroid mass. Re- demonstrated slightly enlarged mediastinal lymph nodes, for example pretracheal lymph node measures 13 mm. Moderate hiatal hernia. Lungs/Pleura: Re- demonstrated extensive pulmonary fibrosis  with honeycombing and bronchiectasis. Right middle lobe pulmonary nodule more difficult to visualize due to respiratory motion. No pleural effusion, consolidation or pneumothorax Upper Abdomen: No acute interval changes.  Atrophic left kidney. Musculoskeletal: No chest wall abnormality. No acute or significant osseous findings. Review of the MIP images confirms the above findings. IMPRESSION: 1. Limited evaluation for emboli in the lower lobes secondary to respiratory motion. Allowing for this, no definite acute pulmonary embolus is visualized. 2. Prominent central pulmonary arteries consistent with pulmonary hypertension 3. Extensive pulmonary fibrosis as detailed on chest CT 03/16/2017. 4. Re- demonstrated mild mediastinal adenopathy 5. Atrophic left kidney Aortic Atherosclerosis (ICD10-I70.0). Electronically Signed   By: Donavan Foil M.D.   On: 03/16/2017 19:15   Ct Chest High Resolution  Result Date: 03/16/2017 CLINICAL DATA:  Worsening shortness of breath over the last couple days. Cough. History of pulmonary fibrosis. EXAM: CT CHEST WITHOUT CONTRAST TECHNIQUE: Multidetector CT imaging of the chest was performed following the standard protocol without intravenous contrast. High resolution imaging of the lungs, as well as inspiratory and expiratory imaging, was performed. COMPARISON:  Radiographs 03/15/2017 and 03/05/2016. Chest CT 07/05/2008. A more recent chest CT performed 10/12/2010 unavailable. FINDINGS: Cardiovascular: Mild atherosclerosis of aorta, great vessels and coronary arteries. There is central enlargement of the pulmonary arteries consistent with pulmonary arterial hypertension. No acute vascular findings are demonstrated on noncontrast imaging. The heart size is normal. There is no pericardial effusion. Mediastinum/Nodes: There are several mildly enlarged mediastinal and hilar lymph nodes, including a 14 mm right paratracheal node on image 44, a 14 mm low right paratracheal node on image 53 and a 10 mm subcarinal node on image 76. Hilar assessment is limited without contrast. No axillary adenopathy. A moderate size hiatal hernia has enlarged compared with the prior study. Lungs/Pleura: No pleural effusion or pneumothorax. There are progressive changes of severe pulmonary fibrosis with subpleural reticulation, fissural thickening, traction bronchiectasis, architectural distortion and extensive honeycomb formation in both lungs. 4 mm right middle lobe nodule on image 74 is stable. There is a small calcified right upper lobe granuloma. There are no new or enlarging pulmonary nodules. There is no focal airspace disease or ground-glass opacity. Inspiration and expiration imaging demonstrates no significant air trapping. Upper abdomen: The visualized upper abdomen appears stable without acute findings.  Musculoskeletal/Chest wall: There is no chest wall mass or suspicious osseous finding. IMPRESSION: 1. Progressive changes of severe pulmonary fibrosis (usual interstitial pneumonitis pattern) compared with available chest CT from 2010. 2. No definite acute superimposed findings or suspicious pulmonary nodules. 3. Mildly prominent mediastinal and hilar lymph nodes are nonspecific, likely reactive. 4. Central enlargement of the pulmonary arteries, consistent with pulmonary arterial hypertension. Aortic and coronary artery atherosclerosis. 5. Moderate size hiatal hernia. Electronically Signed   By: Richardean Sale M.D.   On: 03/16/2017 09:24        Scheduled Meds: . ALPRAZolam  0.25 mg Oral TID  . aspirin  162.5 mg Oral Daily  . atenolol  25 mg Oral Daily  . atorvastatin  80 mg Oral q1800  . enoxaparin (LOVENOX) injection  40 mg Subcutaneous Daily  . feeding supplement (ENSURE ENLIVE)  237 mL Oral TID  . irbesartan  300 mg Oral Daily   And  . hydrochlorothiazide  25 mg Oral Daily  . HYDROcodone-homatropine  5 mL Oral Once  . levofloxacin  750 mg Oral Q2200  . methylPREDNISolone (SOLU-MEDROL) injection  60 mg Intravenous Q6H  . morphine CONCENTRATE  10 mg Oral Q6H  .  pantoprazole  40 mg Oral Daily  . sodium chloride flush  3 mL Intravenous Q12H  . tamsulosin  0.4 mg Oral Daily   Continuous Infusions:   LOS: 1 day    Time spent: > 35 minutes    Velvet Bathe, MD [facility] Pager [phone]  If 7PM-7AM, please contact night-coverage [email or web address] Password [email or web address] 03/17/2017, 5:12 PM

## 2017-03-18 DIAGNOSIS — Z515 Encounter for palliative care: Secondary | ICD-10-CM

## 2017-03-18 DIAGNOSIS — J8410 Pulmonary fibrosis, unspecified: Secondary | ICD-10-CM

## 2017-03-18 DIAGNOSIS — Z7189 Other specified counseling: Secondary | ICD-10-CM

## 2017-03-18 DIAGNOSIS — R0609 Other forms of dyspnea: Secondary | ICD-10-CM

## 2017-03-18 DIAGNOSIS — F419 Anxiety disorder, unspecified: Secondary | ICD-10-CM

## 2017-03-18 LAB — LEGIONELLA PNEUMOPHILA SEROGP 1 UR AG: L. pneumophila Serogp 1 Ur Ag: NEGATIVE

## 2017-03-18 LAB — GLUCOSE, CAPILLARY: GLUCOSE-CAPILLARY: 127 mg/dL — AB (ref 65–99)

## 2017-03-18 MED ORDER — ALPRAZOLAM 0.25 MG PO TABS
0.2500 mg | ORAL_TABLET | ORAL | Status: DC | PRN
  Administered 2017-03-18 – 2017-03-19 (×2): 0.5 mg via ORAL
  Administered 2017-03-19 (×2): 0.25 mg via ORAL
  Administered 2017-03-20 – 2017-03-21 (×6): 0.5 mg via ORAL
  Administered 2017-03-22: 0.25 mg via ORAL
  Administered 2017-03-22 – 2017-03-23 (×4): 0.5 mg via ORAL
  Administered 2017-03-24: 0.25 mg via ORAL
  Administered 2017-03-24 – 2017-03-25 (×5): 0.5 mg via ORAL
  Administered 2017-03-26 (×2): 0.25 mg via ORAL
  Filled 2017-03-18 (×6): qty 2
  Filled 2017-03-18 (×2): qty 1
  Filled 2017-03-18: qty 2
  Filled 2017-03-18: qty 1
  Filled 2017-03-18 (×2): qty 2
  Filled 2017-03-18 (×2): qty 1
  Filled 2017-03-18 (×6): qty 2
  Filled 2017-03-18: qty 1
  Filled 2017-03-18 (×3): qty 2

## 2017-03-18 NOTE — Clinical Social Work Note (Signed)
Clinical Social Work Assessment  Patient Details  Name: [name] [name] MRN: [id number] Date of Birth: [birth year]  Date of referral:  03/18/17               Reason for consult:  End of Life/Hospice, Facility Placement                Permission sought to share information with:  Family Supports Permission granted to share information::  Yes, Verbal Permission Granted  Name::     [name] [name]  Agency::     Relationship::  spouse  Contact Information:  [phone]  Housing/Transportation Living arrangements for the past 2 months:  Single Family Home Source of Information:  Patient, Spouse Patient Interpreter Needed:  None Criminal Activity/Legal Involvement Pertinent to Current Situation/Hospitalization:  No - Comment as needed Significant Relationships:  Adult Children, Spouse Lives with:  Spouse Do you feel safe going back to the place where you live?  Yes Need for family participation in patient care:  Yes (Comment)  Care giving concerns: Patient from home with wife. Palliative following for possible hospice.   Social Worker assessment / plan: CSW met with patient, spouse, and son at bedside. Patient alert and oriented. Patient and family have discussed [place] with palliative NP. Patient and family prefer [facility] if recommendation is for residential hospice. CSW discussed with palliative and plan is to monitor status, patient not stable for transfer today. CSW to follow and make referral to [facility] as appropriate.  Employment status:  Retired Forensic scientist:  Medicare PT Recommendations:  Not assessed at this time [place] / Referral to community resources:  Other (Comment Required) (no referral made at this time, possible residential hospice)  Patient/Family's Response to care: Patient and family appreciative of care.  Patient/Family's Understanding of and Emotional Response to Diagnosis, Current Treatment, and Prognosis: Patient and family with good  understanding of patient's condition and prognosis. Patient and family prefer [facility] if recommendation made to transfer to residential hospice.  Emotional Assessment Appearance:  Appears stated age Attitude/Demeanor/Rapport:  Other (appropraite) Affect (typically observed):  Calm Orientation:  Oriented to Self, Oriented to Place, Oriented to  Time, Oriented to Situation Alcohol / Substance use:  Not Applicable Psych involvement (Current and /or in the community):  No (Comment)  Discharge Needs  Concerns to be addressed:  Discharge Planning Concerns Readmission within the last 30 days:  No Current discharge risk:  Physical Impairment, Terminally ill Barriers to Discharge:  Continued Medical Work up   [name] Emms, LCSW 03/18/2017, 3:40 PM

## 2017-03-18 NOTE — Progress Notes (Signed)
PROGRESS NOTE    [name] [name]  HKV:[id number] DOB: 10-22-1934 DOA: 03/15/2017 PCP: [name], MD    Brief Narrative:  82 y/o with history of IPF who is presenting with IPF flair. Per pulmonology patient has days to weeks left and plan is for comfort care.   Assessment & Plan:   IPF - comfort care measures recommended after evaluation by pulmonologist. Pt is agreeable. Palliative care team consulted - Comfort care orders per Palliative team suspecting in house passing.  DVT prophylaxis: Lovenox Code Status: DNR Family Communication: d/c patient and spouse Disposition Plan: comfort care in house   Consultants:   Palliative care   Procedures: none   Antimicrobials: Levaquin   Subjective: No acute issues overnight reported to me.  Objective: Vitals:   03/18/17 0825 03/18/17 0855 03/18/17 0958 03/18/17 1457  BP: (!) 108/55 (!) 108/55 (!) 159/65 (!) 153/77  Pulse: 78 94 (!) 103 69  Resp: 20 (!) 37 (!) 32 (!) 25  Temp: 98 F (36.7 C)   97.6 F (36.4 C)  TempSrc: Oral   Oral  SpO2: 98% 96% (!) 89% 95%  Weight:        Intake/Output Summary (Last 24 hours) at 03/18/17 1550 Last data filed at 03/18/17 1000  Gross per 24 hour  Intake              240 ml  Output              225 ml  Net               15 ml   Filed Weights   03/16/17 0644 03/17/17 0418 03/18/17 0356  Weight: 72.8 kg (160 lb 8 oz) 72.2 kg (159 lb 3.2 oz) 72 kg (158 lb 12.8 oz)    Examination:  General exam: in nad. Respiratory system: increased wob, tachypneic Cardiovascular system: no cyanosis Gastrointestinal system: Abdomen is nondistended Central nervous system: Alert and awake Extremities: moves extremities equally, no cyanosis at extremities Skin: No rashes, lesions or ulcers, on limited exam. Psychiatry: Mood & affect appropriate.    Data Reviewed: I have personally reviewed following labs and imaging studies  CBC:  Recent Labs Lab 03/15/17 2257 03/16/17 0428    WBC 11.8* 10.7*  NEUTROABS 9.6* 10.2*  HGB 14.2 14.6  HCT 40.9 42.5  MCV 84.7 83.7  PLT 211 564   Basic Metabolic Panel:  Recent Labs Lab 03/15/17 2257 03/16/17 0428  NA 132* 130*  K 4.4 4.0  CL 93* 91*  CO2 32 29  GLUCOSE 98 177*  BUN 20 16  CREATININE 1.14 1.14  CALCIUM 8.8* 8.9   GFR: CrCl cannot be calculated (Unknown ideal weight.). Liver Function Tests:  Recent Labs Lab 03/15/17 2257  AST 33  ALT 15*  ALKPHOS 84  BILITOT 1.2  PROT 6.4*  ALBUMIN 2.8*   No results for input(s): LIPASE, AMYLASE in the last 168 hours. No results for input(s): AMMONIA in the last 168 hours. Coagulation Profile: No results for input(s): INR, PROTIME in the last 168 hours. Cardiac Enzymes:  Recent Labs Lab 03/15/17 2257  TROPONINI <0.03   BNP (last 3 results) No results for input(s): PROBNP in the last 8760 hours. HbA1C: No results for input(s): HGBA1C in the last 72 hours. CBG:  Recent Labs Lab 03/17/17 0457 03/17/17 0728 03/17/17 1127 03/18/17 0822  GLUCAP 137* 131* 196* 127*   Lipid Profile: No results for input(s): CHOL, HDL, LDLCALC, TRIG, CHOLHDL, LDLDIRECT in the last 72 hours.  Thyroid Function Tests: No results for input(s): TSH, T4TOTAL, FREET4, T3FREE, THYROIDAB in the last 72 hours. Anemia Panel: No results for input(s): VITAMINB12, FOLATE, FERRITIN, TIBC, IRON, RETICCTPCT in the last 72 hours. Sepsis Labs:  Recent Labs Lab 03/16/17 0428 03/17/17 0446  PROCALCITON <0.10 <0.10    Recent Results (from the past 240 hour(s))  Respiratory Panel by PCR     Status: None   Collection Time: 03/16/17  5:38 AM  Result Value Ref Range Status   Adenovirus NOT DETECTED NOT DETECTED Final   Coronavirus 229E NOT DETECTED NOT DETECTED Final   Coronavirus HKU1 NOT DETECTED NOT DETECTED Final   Coronavirus NL63 NOT DETECTED NOT DETECTED Final   Coronavirus OC43 NOT DETECTED NOT DETECTED Final   Metapneumovirus NOT DETECTED NOT DETECTED Final   Rhinovirus  / Enterovirus NOT DETECTED NOT DETECTED Final   Influenza A NOT DETECTED NOT DETECTED Final   Influenza B NOT DETECTED NOT DETECTED Final   Parainfluenza Virus 1 NOT DETECTED NOT DETECTED Final   Parainfluenza Virus 2 NOT DETECTED NOT DETECTED Final   Parainfluenza Virus 3 NOT DETECTED NOT DETECTED Final   Parainfluenza Virus 4 NOT DETECTED NOT DETECTED Final   Respiratory Syncytial Virus NOT DETECTED NOT DETECTED Final   Bordetella pertussis NOT DETECTED NOT DETECTED Final   Chlamydophila pneumoniae NOT DETECTED NOT DETECTED Final   Mycoplasma pneumoniae NOT DETECTED NOT DETECTED Final  Culture, sputum-assessment     Status: None   Collection Time: 03/16/17  6:10 AM  Result Value Ref Range Status   Specimen Description EXPECTORATED SPUTUM  Final   Special Requests NONE  Final   Sputum evaluation THIS SPECIMEN IS ACCEPTABLE FOR SPUTUM CULTURE  Final   Report Status 03/17/2017 FINAL  Final  Culture, respiratory (NON-Expectorated)     Status: None (Preliminary result)   Collection Time: 03/16/17  6:10 AM  Result Value Ref Range Status   Specimen Description EXPECTORATED SPUTUM  Final   Special Requests NONE Reflexed from [id number]  Final   Gram Stain   Final    MODERATE WBC PRESENT, PREDOMINANTLY MONONUCLEAR RARE SQUAMOUS EPITHELIAL CELLS PRESENT FEW GRAM POSITIVE COCCI IN PAIRS RARE GRAM POSITIVE RODS RARE GRAM NEGATIVE COCCI IN PAIRS RARE GRAM NEGATIVE RODS    Culture CULTURE REINCUBATED FOR BETTER GROWTH  Final   Report Status PENDING  Incomplete         Radiology Studies: Ct Angio Chest Pe W Or Wo Contrast  Result Date: 03/16/2017 CLINICAL DATA:  Sudden onset shortness of breath EXAM: CT ANGIOGRAPHY CHEST WITH CONTRAST TECHNIQUE: Multidetector CT imaging of the chest was performed using the standard protocol during bolus administration of intravenous contrast. Multiplanar CT image reconstructions and MIPs were obtained to evaluate the vascular anatomy. CONTRAST:  71 mL  Isovue 370 intravenous COMPARISON:  CT 03/16/2017, 07/05/2008 FINDINGS: Cardiovascular: Satisfactory opacification of the pulmonary arteries to the segmental level. Limited evaluation for emboli within the lower lobes, due to significant respiratory motion artifact. Allowing for this, no definite acute filling defects to suggest pulmonary embolus. Prominent central pulmonary vessels suggesting pulmonary hypertension. No aneurysmal dilatation of the aorta. Atherosclerotic calcifications. Common origin of the right brachiocephalic and left common carotid artery. Minimal coronary calcification. Normal heart size. No pericardial effusion. Mediastinum/Nodes: Midline trachea. No thyroid mass. Re- demonstrated slightly enlarged mediastinal lymph nodes, for example pretracheal lymph node measures 13 mm. Moderate hiatal hernia. Lungs/Pleura: Re- demonstrated extensive pulmonary fibrosis with honeycombing and bronchiectasis. Right middle lobe pulmonary nodule more difficult to visualize due  to respiratory motion. No pleural effusion, consolidation or pneumothorax Upper Abdomen: No acute interval changes.  Atrophic left kidney. Musculoskeletal: No chest wall abnormality. No acute or significant osseous findings. Review of the MIP images confirms the above findings. IMPRESSION: 1. Limited evaluation for emboli in the lower lobes secondary to respiratory motion. Allowing for this, no definite acute pulmonary embolus is visualized. 2. Prominent central pulmonary arteries consistent with pulmonary hypertension 3. Extensive pulmonary fibrosis as detailed on chest CT 03/16/2017. 4. Re- demonstrated mild mediastinal adenopathy 5. Atrophic left kidney Aortic Atherosclerosis (ICD10-I70.0). Electronically Signed   By: Donavan Foil M.D.   On: 03/16/2017 19:15        Scheduled Meds: . ALPRAZolam  0.25 mg Oral TID  . aspirin  162.5 mg Oral Daily  . atenolol  25 mg Oral Daily  . atorvastatin  80 mg Oral q1800  . enoxaparin  (LOVENOX) injection  40 mg Subcutaneous Daily  . feeding supplement (ENSURE ENLIVE)  237 mL Oral TID  . irbesartan  300 mg Oral Daily   And  . hydrochlorothiazide  25 mg Oral Daily  . levofloxacin  750 mg Oral Q2200  . methylPREDNISolone (SOLU-MEDROL) injection  60 mg Intravenous Q6H  . morphine CONCENTRATE  10 mg Oral Q6H  . pantoprazole  40 mg Oral Daily  . sodium chloride flush  3 mL Intravenous Q12H  . tamsulosin  0.4 mg Oral Daily   Continuous Infusions:   LOS: 2 days    Time spent: > 35 minutes    Velvet Bathe, MD [facility] Pager [phone]  If 7PM-7AM, please contact night-coverage [email or web address] Password [email or web address] 03/18/2017, 3:50 PM

## 2017-03-18 NOTE — Progress Notes (Signed)
PCCM Interval Note  Notes and plans reviewed.   Appreciate Palliative Care consultation.   I agree with patient's decision to focus on symptom management and control. No new recommendations at this time. Agree with BiPAP only as a comfort maneuver.   Please call if we can assist.   Baltazar Apo, MD, PhD 03/18/2017, 10:08 AM [facility] [phone] or if no answer [phone]

## 2017-03-18 NOTE — Progress Notes (Signed)
Pt refused bipap  Pt on 11L HFNC 96%

## 2017-03-18 NOTE — Progress Notes (Addendum)
Daily Progress Note   Patient Name: [name] [name]       Date: 03/18/2017 DOB: 06-01-1934  Age: 82 y.o. MRN#: [id number] Attending Physician: Velvet Bathe, MD Primary Care Physician: [name], MD Admit Date: 03/15/2017  Reason for Consultation/Follow-up: Establishing goals of care, Hospice Evaluation, Non pain symptom management and Psychosocial/spiritual support  Subjective: Patient in bed. Wife at bedside. Had just been moved around in bed and very tachypneac and short of breath. Asking about how he will die. Wife sharing concerns about him not receiving IV fluids or being on a cardiac monitor at [facility].  Length of Stay: 2  Current Medications: Scheduled Meds:  . ALPRAZolam  0.25 mg Oral TID  . aspirin  162.5 mg Oral Daily  . atenolol  25 mg Oral Daily  . atorvastatin  80 mg Oral q1800  . enoxaparin (LOVENOX) injection  40 mg Subcutaneous Daily  . feeding supplement (ENSURE ENLIVE)  237 mL Oral TID  . irbesartan  300 mg Oral Daily   And  . hydrochlorothiazide  25 mg Oral Daily  . levofloxacin  750 mg Oral Q2200  . methylPREDNISolone (SOLU-MEDROL) injection  60 mg Intravenous Q6H  . morphine CONCENTRATE  10 mg Oral Q6H  . pantoprazole  40 mg Oral Daily  . sodium chloride flush  3 mL Intravenous Q12H  . tamsulosin  0.4 mg Oral Daily    Continuous Infusions:   PRN Meds: acetaminophen **OR** acetaminophen, ALPRAZolam, alum & mag hydroxide-simeth, bisacodyl, guaiFENesin, hydrALAZINE, HYDROcodone-homatropine, LORazepam, morphine injection, morphine CONCENTRATE, ondansetron **OR** ondansetron (ZOFRAN) IV, senna-docusate, temazepam  Physical Exam  Constitutional: He is oriented to person, place, and time. He appears well-nourished. He is cooperative. He appears distressed.    HENT:  Head: Normocephalic and atraumatic.  Pulmonary/Chest: Accessory muscle usage present. Tachypnea noted. He is in respiratory distress.  Musculoskeletal: Normal range of motion.  Neurological: He is alert and oriented to person, place, and time.  Skin: Skin is warm and dry.  Psychiatric: His mood appears anxious.            Vital Signs: BP (!) 159/65   Pulse (!) 103   Temp 98 F (36.7 C) (Oral)   Resp (!) 32   Wt 72 kg (158 lb 12.8 oz)   SpO2 (!) 89%   BMI 24.87 kg/m  SpO2: SpO2: (!) 89 % O2 Device: O2 Device: High Flow Nasal Cannula O2 Flow Rate: O2 Flow Rate (L/min): 15 L/min  Intake/output summary:  Intake/Output Summary (Last 24 hours) at 03/18/17 1058 Last data filed at 03/17/17 2200  Gross per 24 hour  Intake              120 ml  Output              225 ml  Net             -105 ml   LBM: Last BM Date: 03/17/17 Baseline Weight: Weight: 72.8 kg (160 lb 8 oz) Most recent weight: Weight: 72 kg (158 lb 12.8 oz)       Palliative Assessment/Data:40%    Flowsheet Rows     Most Recent Value  Intake Tab  Referral Department  Critical  care  Unit at Time of Referral  Med/Surg Unit  Palliative Care Primary Diagnosis  Pulmonary  Date Notified  03/17/17  Palliative Care Type  New Palliative care  Reason for referral  Non-pain Symptom, Clarify Goals of Care, Counsel Regarding Hospice, Psychosocial or Spiritual support  Date of Admission  03/15/17  Date first seen by Palliative Care  03/17/17  # of days Palliative referral response time  0 Day(s)  # of days IP prior to Palliative referral  2  Clinical Assessment  Palliative Performance Scale Score  30%  Pain Max last 24 hours  Not able to report  Pain Min Last 24 hours  Not able to report  Dyspnea Max Last 24 Hours  Not able to report  Dyspnea Min Last 24 hours  Not able to report  Nausea Max Last 24 Hours  Not able to report  Nausea Min Last 24 Hours  Not able to report  Anxiety Max Last 24 Hours  Not able to  report  Anxiety Min Last 24 Hours  Not able to report  Other Max Last 24 Hours  Not able to report  Psychosocial & Spiritual Assessment  Palliative Care Outcomes  Patient/Family meeting held?  Yes  Who was at the meeting?  pt, wife and 2 sons  Palliative Care Outcomes  Improved non-pain symptom therapy, Clarified goals of care, Counseled regarding hospice  Patient/Family wishes: Interventions discontinued/not started   Mechanical Ventilation  Palliative Care follow-up planned  Yes, Facility      Patient Active Problem List   Diagnosis Date Noted  . Palliative care by specialist   . Anxiety 03/16/2017  . Dyspnea on exertion   . Community acquired pneumonia   . Sepsis (HCC)   . Acute renal failure superimposed on stage 3 chronic kidney disease (HCC)   . Chronic pain syndrome 03/06/2016  . Influenza 03/05/2016  . Hyponatremia 03/05/2016  . CKD (chronic kidney disease) stage 3, GFR 30-59 ml/min (HCC) 03/05/2016  . Thrombocytopenia (HCC) 03/05/2016  . Acute on chronic respiratory failure with hypoxia (HCC) 03/05/2016  . Hx of bladder cancer 08/26/2014  . Patient in clinical research study 07/18/2014  . Chronic respiratory failure with hypoxia (HCC) 06/17/2014  . IPF (idiopathic pulmonary fibrosis) (HCC) 06/16/2014  . Femoral hernia, left 08/27/2011  . Bilateral inguinal herniae (BIH), R>L 06/11/2011  . BRONCHITIS, ACUTE 06/08/2010  . Hyperlipidemia 06/04/2007  . Essential hypertension 06/04/2007  . G E R D 06/04/2007  . BRONCHIECTASIS 03/06/2007  . PULMONARY NODULE, RIGHT MIDDLE LOBE 03/06/2007    Palliative Care Assessment & Plan   HPI: 82 y.o. male  with PMH of interstitial pulmonary fibrosis, HLD, HTN, and cough, admitted on 03/15/2017 with worsening shortness of breath.  Patient has been living with interstitial pulmonary fibrosis for approximately 9 years and has been included in clinical trials at [facility]. His disease has progressed to the point where he is afraid to fall  asleep at night.  He is unable to lie flat.  He has been on morphine 15 mg at bedtime for approximately 1 year.  Additionally he has taken Xanax 0.25 mg for approximately 6 months, typically twice daily.  Spouse shares that sometimes she has had to "cheat" and give him 1-1/2 tablets because of severe anxiety.  Patient reports prior to admission that morphine 15 mg at bedtime was no longer managing his symptoms nor was it sedating.   Assessment: This NP and Yong Channel, NP met with patient and wife at bedside. Patient  appears severely uncomfortable d/t dyspnea. He reports he was much more comfortable throughout the night and is only uncomfortable now d/t slight exertion. He reports even small amounts of exertion cause severe dyspnea. He and his wife agree that, for now, they want to keep him as comfortable as possible without causing sedation because they want to enjoy their time together. However, patient expresses that as the end draws nearer he wants to be sedated so that he may pass peacefully. Patient and wife both seem to desire transfer to [facility]; however, wife continue to express that she is scared since he will not receive intravenous fluids or be on a cardiac monitor. Reiterated that [employer] focuses on comfort and are experts in symptom management. His symptoms will be treated based off the way he appears rather than numbers on a monitor.   Patient became more comfortable after prn morphine given. At this point, symptom burden is so high, we fear he may not tolerate transfer to a facility. We discussed requesting prn medications for dyspnea and utilizing morphine before exertion to ease dyspnea.  Recommendations/Plan:  Utilize prn morphine and xanax for dyspnea and anxiety, continue scheduled morphine and xanax  Continue hycodan for cough  Will round tomorrow AM to see if patient more appropriate for transfer to residential hospice  Patient and family not accepting of morphine  drip at this time - will readdress tomorrow if symptoms remain uncontrolled  Goals of Care and Additional Recommendations:  Limitations on Scope of Treatment: Minimize Medications  Code Status:  DNR  Prognosis:   < 2 weeks  Discharge Planning:  To Be Determined - hospital death vs residential hospice, not stable to move today  Care plan was discussed with RN, social worker, patient, wife, 2 sons  Thank you for allowing the Palliative Medicine Team to assist in the care of this patient.   Time In: 09:00 Time Out: 09:50 Total Time 50 minutes Prolonged Time Billed  no       Greater than 50%  of this time was spent counseling and coordinating care related to the above assessment and plan.  Juel Burrow, DNP, AGNP-C Palliative Medicine Team Team Phone # [phone]

## 2017-03-18 NOTE — Plan of Care (Signed)
Problem: Safety: Goal: Ability to remain free from injury will improve Outcome: Progressing Patient instructed to call for assistance and that his bed alarm would be put on when family/staff not with him to keep him safe, belongings on bedside stand within reach, will continue to monitor,

## 2017-03-19 ENCOUNTER — Encounter (HOSPITAL_COMMUNITY): Payer: Self-pay | Admitting: Student

## 2017-03-19 ENCOUNTER — Encounter (HOSPITAL_COMMUNITY): Payer: Self-pay

## 2017-03-19 LAB — CULTURE, RESPIRATORY

## 2017-03-19 LAB — CULTURE, RESPIRATORY W GRAM STAIN: Culture: NORMAL

## 2017-03-19 LAB — GLUCOSE, CAPILLARY: GLUCOSE-CAPILLARY: 114 mg/dL — AB (ref 65–99)

## 2017-03-19 MED ORDER — MORPHINE SULFATE (PF) 2 MG/ML IV SOLN: 2.0000 mg | INTRAVENOUS | Status: DC | PRN

## 2017-03-19 MED ORDER — MORPHINE SULFATE (CONCENTRATE) 10 MG/0.5ML PO SOLN
10.0000 mg | ORAL | Status: DC | PRN
  Administered 2017-03-20 – 2017-03-26 (×26): 10 mg via ORAL
  Filled 2017-03-19 (×22): qty 0.5

## 2017-03-19 NOTE — Progress Notes (Signed)
Family at bedside, family attempting to feed pt.

## 2017-03-19 NOTE — Plan of Care (Signed)
Problem: Pain Managment: Goal: General experience of comfort will improve Outcome: Progressing Patient is on Morphine, Ativan and Xanax for pain and anxiety which has been effective in keeping patient comfortable, will continue to monitor.

## 2017-03-19 NOTE — Progress Notes (Signed)
Daily Progress Note   Patient Name: [name] [name]       Date: 03/19/2017 DOB: 1934-08-20  Age: 82 y.o. MRN#: [id number] Attending Physician: Velvet Bathe, MD Primary Care Physician: [name], MD Admit Date: 03/15/2017  Reason for Consultation/Follow-up: Establishing goals of care, Hospice Evaluation, Non pain symptom management and Psychosocial/spiritual support  Subjective: Patient in bed resting, states he is more comfortable. Anxiety better controlled. Family at bedside.   Length of Stay: 3  Current Medications: Scheduled Meds:  . ALPRAZolam  0.25 mg Oral TID  . aspirin  162.5 mg Oral Daily  . atenolol  25 mg Oral Daily  . atorvastatin  80 mg Oral q1800  . enoxaparin (LOVENOX) injection  40 mg Subcutaneous Daily  . feeding supplement (ENSURE ENLIVE)  237 mL Oral TID  . irbesartan  300 mg Oral Daily   And  . hydrochlorothiazide  25 mg Oral Daily  . levofloxacin  750 mg Oral Q2200  . methylPREDNISolone (SOLU-MEDROL) injection  60 mg Intravenous Q6H  . morphine CONCENTRATE  10 mg Oral Q6H  . pantoprazole  40 mg Oral Daily  . sodium chloride flush  3 mL Intravenous Q12H  . tamsulosin  0.4 mg Oral Daily    Continuous Infusions:   PRN Meds: acetaminophen **OR** acetaminophen, ALPRAZolam, alum & mag hydroxide-simeth, bisacodyl, hydrALAZINE, HYDROcodone-homatropine, LORazepam, morphine injection, morphine CONCENTRATE, ondansetron **OR** ondansetron (ZOFRAN) IV, senna-docusate, temazepam  Physical Exam  Constitutional: He is oriented to person, place, and time.  HENT:  Head: Normocephalic and atraumatic.  Cardiovascular: Normal rate and regular rhythm.   Pulmonary/Chest: Accessory muscle usage present. Tachypnea noted. He is in respiratory distress.  Abdominal: Soft. There is  no tenderness.  Musculoskeletal: Normal range of motion.  Neurological: He is alert and oriented to person, place, and time.  Skin: Skin is warm and dry. Capillary refill takes less than 2 seconds. He is not diaphoretic.  Psychiatric: His mood appears anxious.            Vital Signs: BP (!) 156/86 (BP Location: Right Arm)   Pulse 83   Temp 98.6 F (37 C) (Axillary)   Resp (!) 22   Wt 73.3 kg (161 lb 11.2 oz)   SpO2 94%   BMI 25.33 kg/m  SpO2: SpO2: 94 % O2 Device: O2 Device: High Flow Nasal Cannula O2 Flow Rate: O2 Flow Rate (L/min): 15 L/min  Intake/output summary:  Intake/Output Summary (Last 24 hours) at 03/19/17 1537 Last data filed at 03/19/17 1100  Gross per 24 hour  Intake              420 ml  Output              400 ml  Net               20 ml   LBM: Last BM Date: 03/16/17 Baseline Weight: Weight: 72.8 kg (160 lb 8 oz) Most recent weight: Weight: 73.3 kg (161 lb 11.2 oz)       Palliative Assessment/Data:30%    Flowsheet Rows     Most Recent Value  Intake Tab  Referral Department  Critical care  Unit at Time of Referral  Med/Surg Unit  Palliative Care Primary Diagnosis  Pulmonary  Date Notified  03/17/17  Palliative Care Type  New Palliative care  Reason for referral  Non-pain Symptom, Clarify Goals of Care, Counsel Regarding Hospice, Psychosocial or Spiritual support  Date of Admission  03/15/17  Date first seen by Palliative Care  03/17/17  # of days Palliative referral response time  0 Day(s)  # of days IP prior to Palliative referral  2  Clinical Assessment  Palliative Performance Scale Score  30%  Pain Max last 24 hours  Not able to report  Pain Min Last 24 hours  Not able to report  Dyspnea Max Last 24 Hours  Not able to report  Dyspnea Min Last 24 hours  Not able to report  Nausea Max Last 24 Hours  Not able to report  Nausea Min Last 24 Hours  Not able to report  Anxiety Max Last 24 Hours  Not able to report  Anxiety Min Last 24 Hours  Not  able to report  Other Max Last 24 Hours  Not able to report  Psychosocial & Spiritual Assessment  Palliative Care Outcomes  Patient/Family meeting held?  Yes  Who was at the meeting?  pt, wife and 2 sons  Palliative Care Outcomes  Improved non-pain symptom therapy, Clarified goals of care, Counseled regarding hospice, Provided advance care planning, Changed to focus on comfort  Patient/Family wishes: Interventions discontinued/not started   Mechanical Ventilation  Palliative Care follow-up planned  Yes, Facility      Patient Active Problem List   Diagnosis Date Noted  . Advance care planning   . Palliative care by specialist   . Anxiety 03/16/2017  . Dyspnea on exertion   . Community acquired pneumonia   . Sepsis ([place])   . Acute renal failure superimposed on stage 3 chronic kidney disease ([place])   . Chronic pain syndrome 03/06/2016  . Influenza 03/05/2016  . Hyponatremia 03/05/2016  . CKD (chronic kidney disease) stage 3, GFR 30-59 ml/min (HCC) 03/05/2016  . Thrombocytopenia ([place]) 03/05/2016  . Acute on chronic respiratory failure with hypoxia ([place]) 03/05/2016  . Hx of bladder cancer 08/26/2014  . Patient in clinical research study 07/18/2014  . Chronic respiratory failure with hypoxia ([place]) 06/17/2014  . IPF (idiopathic pulmonary fibrosis) ([place]) 06/16/2014  . Femoral hernia, left 08/27/2011  . Bilateral inguinal herniae (BIH), R>L 06/11/2011  . BRONCHITIS, ACUTE 06/08/2010  . Hyperlipidemia 06/04/2007  . Essential hypertension 06/04/2007  . G E R D 06/04/2007  . BRONCHIECTASIS 03/06/2007  . PULMONARY NODULE, RIGHT MIDDLE LOBE 03/06/2007    Palliative Care Assessment & Plan   HPI: 82 y.o.malewith PMH of interstitial pulmonary fibrosis, HLD, HTN, and cough,admitted on 10/26/2018with worsening shortness of breath. Patient has been living with interstitial pulmonary fibrosis for approximately 9 years and has been included in clinical trials at [facility]. His disease has  progressed to the point where he is afraid to fall asleep at night. He is unable to lie flat. He has been on morphine 15 mg at bedtime for approximately 1 year. Additionally he has taken Xanax 0.25 mg for approximately 6 months, typically twice daily. Spouse shares that sometimes she has had to "cheat" and give him 1-1/2 tablets because of severe anxiety. Patient reports prior to admission that morphine 15 mg at bedtime was no longer managing his symptoms nor was itsedating.    Assessment: This NP and Vinie Sill, NP met with patient and family at bedside. They report his symptoms of dyspnea and anxiety  are better controlled with medication changes. Patient continues to talk about fears about the end of life. He is fearful of feeling like he is suffocating. We explained how medications can be used to alleviate feelings of suffocating/shortness of breath/anxiety. We explained that those medications are ordered for him and will be utilized as necessary. We also discussed potential use of morphine infusion as symptoms worsen. He is agreeable to this if symptoms worsen, but not ready at this time for an infusion because he wants to be as alert as possible to spend time with his family. Right now, his level of dyspnea and anxiety are acceptable to him and controlled well enough with his PO morphine and xanax.   We discussed option of transferring to residential hospice. With patient's level of dyspnea and high oxygen requirements, he may not tolerate a transfer. Patient and family are both very hesitant about transfer, want to hold off at this time.   Recommendations/Plan:  Utilize prn morphine, xanax, and ativan for dyspnea and anxiety, continue scheduled morphine and xanax  Continue hycodan for cough  Will round tomorrow AM for symptom check - assess if morphine infusion appropriate for symptom management  Goals of Care and Additional Recommendations:  Limitations on Scope of Treatment:  Minimize Medications, Initiate Comfort Feeding and No Artificial Feeding  Code Status:  DNR  Prognosis:   < 2 weeks  Discharge Planning:  To Be Determined - anticipate hospital death vs residential hospice  Care plan was discussed with RN, social worker, patient, wife, sons  Thank you for allowing the Palliative Medicine Team to assist in the care of this patient.       Total Time 40 minutes Prolonged Time Billed  no       Greater than 50%  of this time was spent counseling and coordinating care related to the above assessment and plan.  Juel Burrow, DNP, AGNP-C Palliative Medicine Team Team Phone # [phone]

## 2017-03-19 NOTE — Progress Notes (Signed)
PROGRESS NOTE    [name] [name]  NOM:[id number] DOB: September 11, 1934 DOA: 03/15/2017 PCP: [name], MD    Brief Narrative:  82 y/o with history of IPF who is presenting with IPF flair. Per pulmonology patient has days to weeks left and plan is for comfort care.  Palliative assisting  Assessment & Plan:   IPF - comfort care measures recommended after evaluation by pulmonologist. Pt is agreeable. Palliative care team consulted - Comfort care orders per Palliative team suspecting in house passing. - Will d/c some non comfort medications. - Pt was interested in morphine pump  DVT prophylaxis: Lovenox Code Status: DNR Family Communication: d/c patient and spouse Disposition Plan: comfort care in house   Consultants:   Palliative care   Procedures: none   Antimicrobials: Levaquin   Subjective: _Pt expressess wishes to be comfortable.  Objective: Vitals:   03/19/17 0924 03/19/17 1345 03/19/17 1347 03/19/17 1500  BP:  (!) 156/86    Pulse: 92 93  83  Resp: (!) 34 20  (!) 22  Temp:  98.6 F (37 C)    TempSrc:  Axillary    SpO2: 92% (!) 89% 93% 94%  Weight:        Intake/Output Summary (Last 24 hours) at 03/19/17 1905 Last data filed at 03/19/17 1400  Gross per 24 hour  Intake              640 ml  Output              400 ml  Net              240 ml   Filed Weights   03/17/17 0418 03/18/17 0356 03/19/17 0500  Weight: 72.2 kg (159 lb 3.2 oz) 72 kg (158 lb 12.8 oz) 73.3 kg (161 lb 11.2 oz)    Examination:  General exam: in nad. Alert and awake Respiratory system: increased wob, tachypneic,  in place Cardiovascular system: no cyanosis Gastrointestinal system: Abdomen is nondistended Central nervous system: Alert and awake Extremities: moves extremities equally, no cyanosis at extremities Skin: No rashes, lesions or ulcers, on limited exam. Psychiatry: Mood & affect appropriate.    Data Reviewed: I have personally reviewed following labs and  imaging studies  CBC:  Recent Labs Lab 03/15/17 2257 03/16/17 0428  WBC 11.8* 10.7*  NEUTROABS 9.6* 10.2*  HGB 14.2 14.6  HCT 40.9 42.5  MCV 84.7 83.7  PLT 211 962   Basic Metabolic Panel:  Recent Labs Lab 03/15/17 2257 03/16/17 0428  NA 132* 130*  K 4.4 4.0  CL 93* 91*  CO2 32 29  GLUCOSE 98 177*  BUN 20 16  CREATININE 1.14 1.14  CALCIUM 8.8* 8.9   GFR: CrCl cannot be calculated (Unknown ideal weight.). Liver Function Tests:  Recent Labs Lab 03/15/17 2257  AST 33  ALT 15*  ALKPHOS 84  BILITOT 1.2  PROT 6.4*  ALBUMIN 2.8*   No results for input(s): LIPASE, AMYLASE in the last 168 hours. No results for input(s): AMMONIA in the last 168 hours. Coagulation Profile: No results for input(s): INR, PROTIME in the last 168 hours. Cardiac Enzymes:  Recent Labs Lab 03/15/17 2257  TROPONINI <0.03   BNP (last 3 results) No results for input(s): PROBNP in the last 8760 hours. HbA1C: No results for input(s): HGBA1C in the last 72 hours. CBG:  Recent Labs Lab 03/17/17 0457 03/17/17 0728 03/17/17 1127 03/18/17 0822 03/19/17 0738  GLUCAP 137* 131* 196* 127* 114*   Lipid Profile: No  results for input(s): CHOL, HDL, LDLCALC, TRIG, CHOLHDL, LDLDIRECT in the last 72 hours. Thyroid Function Tests: No results for input(s): TSH, T4TOTAL, FREET4, T3FREE, THYROIDAB in the last 72 hours. Anemia Panel: No results for input(s): VITAMINB12, FOLATE, FERRITIN, TIBC, IRON, RETICCTPCT in the last 72 hours. Sepsis Labs:  Recent Labs Lab 03/16/17 0428 03/17/17 0446  PROCALCITON <0.10 <0.10    Recent Results (from the past 240 hour(s))  Respiratory Panel by PCR     Status: None   Collection Time: 03/16/17  5:38 AM  Result Value Ref [name] Status   Adenovirus NOT DETECTED NOT DETECTED Final   Coronavirus 229E NOT DETECTED NOT DETECTED Final   Coronavirus HKU1 NOT DETECTED NOT DETECTED Final   Coronavirus NL63 NOT DETECTED NOT DETECTED Final   Coronavirus OC43 NOT  DETECTED NOT DETECTED Final   Metapneumovirus NOT DETECTED NOT DETECTED Final   Rhinovirus / Enterovirus NOT DETECTED NOT DETECTED Final   Influenza A NOT DETECTED NOT DETECTED Final   Influenza B NOT DETECTED NOT DETECTED Final   Parainfluenza Virus 1 NOT DETECTED NOT DETECTED Final   Parainfluenza Virus 2 NOT DETECTED NOT DETECTED Final   Parainfluenza Virus 3 NOT DETECTED NOT DETECTED Final   Parainfluenza Virus 4 NOT DETECTED NOT DETECTED Final   Respiratory Syncytial Virus NOT DETECTED NOT DETECTED Final   Bordetella pertussis NOT DETECTED NOT DETECTED Final   Chlamydophila pneumoniae NOT DETECTED NOT DETECTED Final   Mycoplasma pneumoniae NOT DETECTED NOT DETECTED Final  Culture, sputum-assessment     Status: None   Collection Time: 03/16/17  6:10 AM  Result Value Ref [name] Status   Specimen Description EXPECTORATED SPUTUM  Final   Special Requests NONE  Final   Sputum evaluation THIS SPECIMEN IS ACCEPTABLE FOR SPUTUM CULTURE  Final   Report Status 03/17/2017 FINAL  Final  Culture, respiratory (NON-Expectorated)     Status: None   Collection Time: 03/16/17  6:10 AM  Result Value Ref [name] Status   Specimen Description EXPECTORATED SPUTUM  Final   Special Requests NONE Reflexed from [id number]  Final   Gram Stain   Final    MODERATE WBC PRESENT, PREDOMINANTLY MONONUCLEAR RARE SQUAMOUS EPITHELIAL CELLS PRESENT FEW GRAM POSITIVE COCCI IN PAIRS RARE GRAM POSITIVE RODS RARE GRAM NEGATIVE COCCI IN PAIRS RARE GRAM NEGATIVE RODS    Culture Consistent with normal respiratory flora.  Final   Report Status 03/19/2017 FINAL  Final         Radiology Studies: No results found.      Scheduled Meds: . ALPRAZolam  0.25 mg Oral TID  . aspirin  162.5 mg Oral Daily  . atenolol  25 mg Oral Daily  . atorvastatin  80 mg Oral q1800  . enoxaparin (LOVENOX) injection  40 mg Subcutaneous Daily  . feeding supplement (ENSURE ENLIVE)  237 mL Oral TID  . irbesartan  300 mg Oral Daily   And   . hydrochlorothiazide  25 mg Oral Daily  . levofloxacin  750 mg Oral Q2200  . methylPREDNISolone (SOLU-MEDROL) injection  60 mg Intravenous Q6H  . morphine CONCENTRATE  10 mg Oral Q6H  . pantoprazole  40 mg Oral Daily  . sodium chloride flush  3 mL Intravenous Q12H  . tamsulosin  0.4 mg Oral Daily   Continuous Infusions:   LOS: 3 days    Time spent: > 35 minutes    Velvet Bathe, MD [facility] Pager [phone]  If 7PM-7AM, please contact night-coverage [email or web address] Password [email or web address] 03/19/2017, 7:05 PM

## 2017-03-19 NOTE — Progress Notes (Signed)
Water level sufficient at this time.

## 2017-03-20 LAB — GLUCOSE, CAPILLARY: GLUCOSE-CAPILLARY: 111 mg/dL — AB (ref 65–99)

## 2017-03-20 MED ORDER — SENNA 8.6 MG PO TABS
1.0000 | ORAL_TABLET | Freq: Every day | ORAL | Status: DC
  Administered 2017-03-20 – 2017-03-26 (×7): 8.6 mg via ORAL
  Filled 2017-03-20 (×7): qty 1

## 2017-03-20 MED ORDER — PANTOPRAZOLE SODIUM 40 MG PO TBEC
40.0000 mg | DELAYED_RELEASE_TABLET | Freq: Every day | ORAL | Status: DC
  Administered 2017-03-20 – 2017-03-26 (×7): 40 mg via ORAL
  Filled 2017-03-20 (×7): qty 1

## 2017-03-20 NOTE — Consult Note (Signed)
[facility] CM Primary Care Navigator  03/20/2017  [name] [name] [birth year] [id number]   Met with patient and wife [name]) at the bedside to identify possible discharge needs. Wife reports that palliative consult was done with plan in place for comfort care and transition to Hospice care.  Anticipate in house passing per MD note. Patient and family prefers Optometrist if recommendation is for residential hospice.  Per chart review, patient's level of dyspnea and oxygen requirements make him unstable for transport out of facility at this time.  No further health management neededat this point.   For additional questions please contact:  Edwena Felty A. Ysela Hettinger, BSN, RN-BC [facility] PRIMARY CARE Navigator Cell: [phone]

## 2017-03-20 NOTE — Progress Notes (Signed)
-  continue comfort focused care, appreciate palliative help -Possibly residential Hospice if remains stable -stop Levaquin, IV steroids  Domenic Polite, MD

## 2017-03-20 NOTE — Progress Notes (Addendum)
Daily Progress Note   Patient Name: [name] [name]       Date: 03/20/2017 DOB: 1934/05/26  Age: 82 y.o. MRN#: [id number] Attending Physician: Domenic Polite, MD Primary Care Physician: [name], MD Admit Date: 03/15/2017  Reason for Consultation/Follow-up: Establishing goals of care, Hospice Evaluation, Non pain symptom management and Terminal Care  Subjective: Patient sitting up in bed eating breakfast, some dyspnea, wife at bedside.   Length of Stay: 4  Current Medications: Scheduled Meds:  . ALPRAZolam  0.25 mg Oral TID  . feeding supplement (ENSURE ENLIVE)  237 mL Oral TID  . levofloxacin  750 mg Oral Q2200  . methylPREDNISolone (SOLU-MEDROL) injection  60 mg Intravenous Q6H  . morphine CONCENTRATE  10 mg Oral Q6H  . pantoprazole  40 mg Oral Daily  . senna  1 tablet Oral Daily  . sodium chloride flush  3 mL Intravenous Q12H  . tamsulosin  0.4 mg Oral Daily    Continuous Infusions:   PRN Meds: acetaminophen **OR** acetaminophen, ALPRAZolam, alum & mag hydroxide-simeth, bisacodyl, hydrALAZINE, HYDROcodone-homatropine, LORazepam, morphine injection, morphine CONCENTRATE, ondansetron **OR** ondansetron (ZOFRAN) IV, temazepam  Physical Exam  Constitutional: He is oriented to person, place, and time. He appears well-developed and well-nourished. He is cooperative.  HENT:  Head: Normocephalic and atraumatic.  Cardiovascular: Normal rate and regular rhythm.   Pulmonary/Chest: Accessory muscle usage present. Tachypnea noted.  Abdominal: Soft. There is no tenderness. There is no guarding.  Musculoskeletal: Normal range of motion.  Neurological: He is alert and oriented to person, place, and time.  Skin: Skin is warm and dry. Capillary refill takes less than 2 seconds. He is not  diaphoretic.  Psychiatric: His mood appears anxious.            Vital Signs: BP 132/72 (BP Location: Right Arm)   Pulse (!) 58   Temp 97.6 F (36.4 C) (Oral)   Resp 18   Ht 5' 7" (1.702 m)   Wt 72.3 kg (159 lb 8 oz)   SpO2 92%   BMI 24.98 kg/m  SpO2: SpO2: 92 % O2 Device: O2 Device: High Flow Nasal Cannula O2 Flow Rate: O2 Flow Rate (L/min): 12 L/min  Intake/output summary:  Intake/Output Summary (Last 24 hours) at 03/20/17 8588 Last data filed at 03/19/17 2130  Gross per 24 hour  Intake              620 ml  Output                0 ml  Net              620 ml   LBM: Last BM Date: 03/16/17 Baseline Weight: Weight: 72.8 kg (160 lb 8 oz) Most recent weight: Weight: 72.3 kg (159 lb 8 oz)       Palliative Assessment/Data:    Flowsheet Rows     Most Recent Value  Intake Tab  Referral Department  Critical care  Unit at Time of Referral  Med/Surg Unit  Palliative Care Primary Diagnosis  Pulmonary  Date Notified  03/17/17  Palliative Care Type  New Palliative care  Reason for referral  Non-pain Symptom, Clarify Goals of Care, Counsel Regarding Hospice, Psychosocial  or Spiritual support  Date of Admission  03/15/17  Date first seen by Palliative Care  03/17/17  # of days Palliative referral response time  0 Day(s)  # of days IP prior to Palliative referral  2  Clinical Assessment  Palliative Performance Scale Score  30%  Pain Max last 24 hours  Not able to report  Pain Min Last 24 hours  Not able to report  Dyspnea Max Last 24 Hours  Not able to report  Dyspnea Min Last 24 hours  Not able to report  Nausea Max Last 24 Hours  Not able to report  Nausea Min Last 24 Hours  Not able to report  Anxiety Max Last 24 Hours  Not able to report  Anxiety Min Last 24 Hours  Not able to report  Other Max Last 24 Hours  Not able to report  Psychosocial & Spiritual Assessment  Palliative Care Outcomes  Patient/Family meeting held?  Yes  Who was at the meeting?  pt, wife and 2  sons  Palliative Care Outcomes  Improved non-pain symptom therapy, Clarified goals of care, Counseled regarding hospice, Provided advance care planning, Changed to focus on comfort  Patient/Family wishes: Interventions discontinued/not started   Mechanical Ventilation  Palliative Care follow-up planned  Yes, Facility      Patient Active Problem List   Diagnosis Date Noted  . Advance care planning   . Palliative care by specialist   . Anxiety 03/16/2017  . Dyspnea on exertion   . Community acquired pneumonia   . Sepsis ([place])   . Acute renal failure superimposed on stage 3 chronic kidney disease ([place])   . Chronic pain syndrome 03/06/2016  . Influenza 03/05/2016  . Hyponatremia 03/05/2016  . CKD (chronic kidney disease) stage 3, GFR 30-59 ml/min (HCC) 03/05/2016  . Thrombocytopenia ([employer]) 03/05/2016  . Acute on chronic respiratory failure with hypoxia ([place]) 03/05/2016  . Hx of bladder cancer 08/26/2014  . Patient in clinical research study 07/18/2014  . Chronic respiratory failure with hypoxia ([place]) 06/17/2014  . IPF (idiopathic pulmonary fibrosis) ([place]) 06/16/2014  . Femoral hernia, left 08/27/2011  . Bilateral inguinal herniae (BIH), R>L 06/11/2011  . BRONCHITIS, ACUTE 06/08/2010  . Hyperlipidemia 06/04/2007  . Essential hypertension 06/04/2007  . G E R D 06/04/2007  . BRONCHIECTASIS 03/06/2007  . PULMONARY NODULE, RIGHT MIDDLE LOBE 03/06/2007    Palliative Care Assessment & Plan   HPI: 82 y.o.malewith PMHof interstitial pulmonary fibrosis, HLD, HTN, andcough,admitted on 10/26/2018with worsening shortness of breath. Patient has been living with interstitial pulmonary fibrosis for approximately 9 years and has been included in clinical trials at [facility]. His disease has progressed to the point where he is afraid to fall asleep at night. He is unable to lie flat. He has been on morphine 15 mg at bedtime for approximately 1 year. Additionally he has taken Xanax 0.25 mg for  approximately 6 months, typically twice daily. Spouse shares that sometimes she has had to "cheat" and give him 1-1/2 tablets because of severe anxiety. Patient reports prior to admission that morphine 15 mg at bedtime was no longer managing his symptoms nor was itsedating.   Assessment: This NP and Vinie Sill, NP met with patient and wife at bedside. Patient reports a decent night - not as good as the past 2. He had to ask for prn xanax twice for anxiety. He also asked his son to stay the night with him because he did not want to be left alone. He continues  to report that he has dyspnea and anxiety. He says dyspnea/anxiety is controlled to an acceptable level to him with scheduled and prn medications. He continues to want to balance medications with level of consciousness so that he may spend as much time as possible interacting with his family. His level of dyspnea and oxygen requirements make him unstable for transport out of facility at this time. Patient states he is still not ready for morphine infusion but will reassess regularly. We discussed how he may benefit from steady infusion instead of PO meds to control symptoms.   He requests restarting protonix because he feels some of his pills cause indigestion. We also discussed a bowel regimen - no BM since 10/27. No abdominal discomfort at this time. Will add senna daily. Dulcolax suppository available as well.   Wife continues to express anxiety. Does not want to move patient from current room, happy with staff, does not want cardiac monitor removed.   Recommendations/Plan:  Utilize prn morphine, xanax, and ativan for dyspnea and anxiety, continue scheduled morphine and xanax  Continue hycodan for cough  Add protonix for indigestion  Add scheduled senna to prevent constipation, prn dulcolax  Will round tomorrow AM for symptom check - assess if morphine infusion appropriate for symptom management  Goals of Care and Additional  Recommendations:  Limitations on Scope of Treatment: Full Comfort Care, Minimize Medications, Initiate Comfort Feeding, No Artificial Feeding, No Diagnostics, No Glucose Monitoring, No IV Antibiotics and No IV Fluids  Code Status:  DNR  Prognosis:   < 2 weeks  Discharge Planning:  Anticipated Hospital Death  Care plan was discussed with patient, wife, bedside nurse  Thank you for allowing the Palliative Medicine Team to assist in the care of this patient.   Total Time 30 minutes Prolonged Time Billed  no       Greater than 50%  of this time was spent counseling and coordinating care related to the above assessment and plan.  Juel Burrow, DNP, AGNP-C Palliative Medicine Team Team Phone # [phone]

## 2017-03-20 NOTE — Care Management Note (Addendum)
Case Management Note  Patient Details  Name: [name] [name] MRN: [id number] Date of Birth: 07-17-1934  Subjective/Objective:  Pt presented for SOB- End Stage Pulmonary Fibrosis- pt is a DNR. Palliative Care Following. Pt is on 12 L High Flow [place]- IV Solumedrol- per Palliative unstable for transport to Residential Facility.                    Action/Plan: CSW and CM following for disposition needs. Will continue to monitor.   Expected Discharge Date:                  Expected Discharge Plan:  [place]  In-House Referral:  Clinical Social Work  Discharge planning Services  CM Consult  Post Acute Care Choice:   N/A Choice offered to:   N/A  DME Arranged:   N/A DME Agency:   N/A  HH Arranged:   N/A HH Agency:    N/A  Status of Service:  Completed If discussed at [place] of Stay Meetings, dates discussed:  03-20-17, 03-26-17   Additional Comments: 1028 03-26-17 Jacqlyn Krauss, RN, BSN [phone] HINN-12 issued for continued stay. Per MD notes pt is medically stable and no longer needing hospital level of care and just awaiting bed for [facility]. [facility] has no bed availability at this time. CM did provide pt with HINN-12 for Hospital Issued Notice of Non Coverage. After today pt will be responsible for the bill- Medicare will no longer pay for continued stay. Pt did ask CM to reach out to wife in regards to no bed availability at [facility]. Bed availability at [facility] at this time. Wife was contacted and she will need to discuss with family in regards to plan of care.     1509 11-1-8 Jacqlyn Krauss, RN,BSN [phone] Plan for Residential Facility- first choice [facility]. CM Will continue to monitor for additional needs.  Bethena Roys, RN 03/20/2017, 3:32 PM

## 2017-03-20 NOTE — Care Management Important Message (Signed)
Important Message  Patient Details  Name: [name] [name] MRN: [id number] Date of Birth: [birth year]   Medicare Important Message Given:  Yes    Saprina Chuong Abena 03/20/2017, 10:10 AM

## 2017-03-20 NOTE — Plan of Care (Signed)
Problem: Respiratory: Goal: Ability to maintain normal oxygenation or baseline will improve Outcome: Not Progressing With exertion, patient's O2 saturation falls within the 80's. Ranging from 12-15 L HFNC. PRN medications utilized for dyspnea/pain/anxiety. Patient notifies RN as needed for his PRN's. Patient states if he starts to feel like he's suffocating, he'd like to be medicated so he doesn't suffer. Family at bedside with patient. Palliative to see patient again today. Comfort measures provided.

## 2017-03-21 ENCOUNTER — Encounter (HOSPITAL_COMMUNITY): Payer: Self-pay

## 2017-03-21 DIAGNOSIS — R0602 Shortness of breath: Secondary | ICD-10-CM

## 2017-03-21 LAB — GLUCOSE, CAPILLARY
Glucose-Capillary: 76 mg/dL (ref 65–99)
Glucose-Capillary: 113 mg/dL — ABNORMAL HIGH (ref 65–99)

## 2017-03-21 NOTE — Progress Notes (Signed)
Daily Progress Note   Patient Name: [name] [name]       Date: 03/21/2017 DOB: 01-Jan-1935  Age: 82 y.o. MRN#: [id number] Attending Physician: Domenic Polite, MD Primary Care Physician: [name], MD Admit Date: 03/15/2017  Reason for Consultation/Follow-up: Disposition, Establishing goals of care, Hospice Evaluation, Inpatient hospice referral and Non pain symptom management  Subjective: Pt resting to bed, eating small bites of oatmeal, states he rested well and is more comfortable. Wife and son at bedside.   Length of Stay: 5  Current Medications: Scheduled Meds:  . ALPRAZolam  0.25 mg Oral TID  . feeding supplement (ENSURE ENLIVE)  237 mL Oral TID  . morphine CONCENTRATE  10 mg Oral Q6H  . pantoprazole  40 mg Oral Daily  . senna  1 tablet Oral Daily  . sodium chloride flush  3 mL Intravenous Q12H  . tamsulosin  0.4 mg Oral Daily    Continuous Infusions:   PRN Meds: acetaminophen **OR** acetaminophen, ALPRAZolam, alum & mag hydroxide-simeth, bisacodyl, HYDROcodone-homatropine, LORazepam, morphine injection, morphine CONCENTRATE, ondansetron **OR** ondansetron (ZOFRAN) IV, temazepam  Physical Exam  Constitutional: He is oriented to person, place, and time. He appears well-nourished. No distress.  HENT:  Head: Normocephalic and atraumatic.  Cardiovascular: Normal rate and regular rhythm.   Pulmonary/Chest: Tachypnea noted.  Neurological: He is oriented to person, place, and time.  Appears sleepier today  Skin: Skin is warm and dry. Capillary refill takes less than 2 seconds. He is not diaphoretic.  Psychiatric: His speech is normal. His mood appears not anxious.            Vital Signs: BP 132/69 (BP Location: Right Arm)   Pulse 63   Temp 97.7 F (36.5 C) (Oral)   Resp (!)  22   Ht _0  (1.702 m)   Wt 72.1 kg (159 lb)   SpO2 97%   BMI 24.90 kg/m  SpO2: SpO2: 97 % O2 Device: O2 Device: High Flow Nasal Cannula O2 Flow Rate: O2 Flow Rate (L/min): 14 L/min  Intake/output summary:  Intake/Output Summary (Last 24 hours) at 03/21/17 1113 Last data filed at 03/20/17 2300  Gross per 24 hour  Intake              390 ml  Output                0 ml  Net              390 ml   LBM: Last BM Date: 03/16/17 (patient refusing [place]) Baseline Weight: Weight: 72.8 kg (160 lb 8 oz) Most recent weight: Weight: 72.1 kg (159 lb)       Palliative Assessment/Data:30%    Flowsheet Rows     Most Recent Value  Intake Tab  Referral Department  Critical care  Unit at Time of Referral  Med/Surg Unit  Palliative Care Primary Diagnosis  Pulmonary  Date Notified  03/17/17  Palliative Care Type  New Palliative care  Reason for referral  Non-pain Symptom, Clarify Goals of Care, Counsel Regarding Hospice, Psychosocial or Spiritual support  Date of Admission  03/15/17  Date first seen by Palliative Care  03/17/17  # of days Palliative referral response  time  0 Day(s)  # of days IP prior to Palliative referral  2  Clinical Assessment  Palliative Performance Scale Score  30%  Pain Max last 24 hours  Not able to report  Pain Min Last 24 hours  Not able to report  Dyspnea Max Last 24 Hours  Not able to report  Dyspnea Min Last 24 hours  Not able to report  Nausea Max Last 24 Hours  Not able to report  Nausea Min Last 24 Hours  Not able to report  Anxiety Max Last 24 Hours  Not able to report  Anxiety Min Last 24 Hours  Not able to report  Other Max Last 24 Hours  Not able to report  Psychosocial & Spiritual Assessment  Palliative Care Outcomes  Patient/Family meeting held?  Yes  Who was at the meeting?  pt, wife and 2 sons  Palliative Care Outcomes  Improved non-pain symptom therapy, Clarified goals of care, Counseled regarding hospice, Provided advance care planning,  Changed to focus on comfort  Patient/Family wishes: Interventions discontinued/not started   Mechanical Ventilation  Palliative Care follow-up planned  Yes, Facility      Patient Active Problem List   Diagnosis Date Noted  . Advance care planning   . Palliative care by specialist   . Anxiety 03/16/2017  . Dyspnea on exertion   . Community acquired pneumonia   . Sepsis ([place])   . Acute renal failure superimposed on stage 3 chronic kidney disease ([place])   . Chronic pain syndrome 03/06/2016  . Influenza 03/05/2016  . Hyponatremia 03/05/2016  . CKD (chronic kidney disease) stage 3, GFR 30-59 ml/min (HCC) 03/05/2016  . Thrombocytopenia ([place]) 03/05/2016  . Acute on chronic respiratory failure with hypoxia ([place]) 03/05/2016  . Hx of bladder cancer 08/26/2014  . Patient in clinical research study 07/18/2014  . Chronic respiratory failure with hypoxia ([place]) 06/17/2014  . IPF (idiopathic pulmonary fibrosis) ([place]) 06/16/2014  . Femoral hernia, left 08/27/2011  . Bilateral inguinal herniae (BIH), R>L 06/11/2011  . BRONCHITIS, ACUTE 06/08/2010  . Hyperlipidemia 06/04/2007  . Essential hypertension 06/04/2007  . G E R D 06/04/2007  . BRONCHIECTASIS 03/06/2007  . PULMONARY NODULE, RIGHT MIDDLE LOBE 03/06/2007    Palliative Care Assessment & Plan   HPI: 82 y.o.malewith PMHof interstitial pulmonary fibrosis, HLD, HTN, andcough,admitted on 10/26/2018with worsening shortness of breath. Patient has been living with interstitial pulmonary fibrosis for approximately 9 years and has been included in clinical trials at [facility]. His disease has progressed to the point where he is afraid to fall asleep at night. He is unable to lie flat. He has been on morphine 15 mg at bedtime for approximately 1 year. Additionally he has taken Xanax 0.25 mg for approximately 6 months, typically twice daily. Spouse shares that sometimes she has had to "cheat" and give him 1-1/2 tablets because of severe anxiety.  Patient reports prior to admission that morphine 15 mg at bedtime was no longer managing his symptoms nor was itsedating.   Assessment: This NP and Vinie Sill, NP met with patient and wife at bedside. Reports he rested well last night with the help of morphine and xanax. Son stayed with him. Dyspnea and anxiety better controlled today with medications. He continues to want to balance medications with level of consciousness so that he may spend as much time as possible interacting with his family. We discussed transition to inpatient hospice for symptom control. Family and patient are anxious about transition but agreeable. Attempted to  reassure family about quality of care that will be provided. Emotional support provided.   Recommendations/Plan:  Transition to residential hospice when bed is available - will need doses of morphine/xanax prior to transfer for symptom control  Utilize prn morphine,xanax, and ativanfor dyspnea and anxiety, continue scheduled morphine and xanax  Continue hycodan for cough  Scheduled senna to prevent constipation, prn dulcolax  Goals of Care and Additional Recommendations:  Limitations on Scope of Treatment: Avoid Hospitalization, Full Comfort Care, Minimize Medications, Initiate Comfort Feeding, No Artificial Feeding, No Diagnostics, No Glucose Monitoring, No IV Antibiotics, No IV Fluids and No Lab Draws  Code Status:  DNR  Prognosis:   < 2 weeks  Discharge Planning:  Hospice facility  Care plan was discussed with patient, family, social work, Dr. Broadus John  Thank you for allowing the Palliative Medicine Team to assist in the care of this patient.   Total Time 25 minutes Prolonged Time Billed  no       Greater than 50%  of this time was spent counseling and coordinating care related to the above assessment and plan.  Juel Burrow, DNP, AGNP-C Palliative Medicine Team Team Phone # [phone]

## 2017-03-21 NOTE — Plan of Care (Signed)
Problem: Physical Regulation: Goal: Ability to maintain clinical measurements within normal limits will improve Outcome: Progressing Patient remains on 12-15L HFNC, O2 increased with exertion (turning/urinal use).   Problem: Skin Integrity: Goal: Risk for impaired skin integrity will decrease Outcome: Progressing Patient allowed staff to do a bed bath/bed change. Skin cleansed, intact.   Problem: Activity: Goal: Risk for activity intolerance will decrease Outcome: Progressing Patient does not want to turn often due to is WOB.

## 2017-03-21 NOTE — Progress Notes (Signed)
[facility] Liaison: RN visit  Received request from [place] for family interest in [facility]. Chart reviewed and spoke with family and patient at bedside to acknowledge referral. Unfortunately [facility] is not able to offer a room today. Family and CSW are aware. Hospital Liaison will follow up with CSW and family tomorrow if room becomes available. Please do hesitate to call with questions.   Thank you,   Farrel Gordon RN [facility] Liaison  [place] are on AMION

## 2017-03-21 NOTE — Progress Notes (Signed)
PROGRESS NOTE    [name] [name]  OHY:[id number] DOB: December 17, 1934 DOA: 03/15/2017 PCP: [name], MD    Brief Narrative:  82 y/o with history of chronic respiratory failure secondary to advanced pulmonary fibrosis was admitted with acute on chronic hypoxic respiratory failure secondary to IPF flare  -Seen by pulmonary and palliative care , continue comfort focused care   Assessment & Plan:   1. Acute on chronic hypoxic respiratory failure -Due to IPF flare 2. Advanced idiopathic pulmonary fibrosis 3. Chronic hyponatremia 4. Chronic pain 5. Anxiety -Seen by pulmonary this admission, hospice and comfort focused care was recommended -Palliative following, continue comfort care -Discussed residential hospice with spouse and son today   Code Status: DNR Family Communication: Wife and son at bedside Disposition Plan: Residential hospice   Consultants:   Palliative care   Procedures: none  Subjective: -Was able to sleep a few hours, comfortable now, dyspneic with minimal activity or exertion  Objective: Vitals:   03/21/17 0627 03/21/17 0736 03/21/17 0810 03/21/17 1247  BP: 115/63 132/69  101/62  Pulse: 62 63    Resp: (!) 22     Temp: (!) 97.5 F (36.4 C) 97.7 F (36.5 C)  97.6 F (36.4 C)  TempSrc: Axillary Oral  Oral  SpO2: 96%  97%   Weight: 72.1 kg (159 lb)     Height:        Intake/Output Summary (Last 24 hours) at 03/21/17 1345 Last data filed at 03/20/17 2300  Gross per 24 hour  Intake              270 ml  Output                0 ml  Net              270 ml   Filed Weights   03/19/17 0500 03/20/17 0518 03/21/17 0627  Weight: 73.3 kg (161 lb 11.2 oz) 72.3 kg (159 lb 8 oz) 72.1 kg (159 lb)    Examination:  Gen: Awake, Alert, Oriented X 3, tachypneic with minimal exertion, chronically ill-appearing HEENT: PERRLA, Neck supple, no JVD Lungs: Fine crackles throughout lung fields CVS: S1-S2 regular rate rhythm, tachycardic Abd: soft, Non  tender, non distended, BS present Extremities: No Cyanosis, Clubbing or edema Skin: no new rashes   Data Reviewed: I have personally reviewed following labs and imaging studies  CBC:  Recent Labs Lab 03/15/17 2257 03/16/17 0428  WBC 11.8* 10.7*  NEUTROABS 9.6* 10.2*  HGB 14.2 14.6  HCT 40.9 42.5  MCV 84.7 83.7  PLT 211 948   Basic Metabolic Panel:  Recent Labs Lab 03/15/17 2257 03/16/17 0428  NA 132* 130*  K 4.4 4.0  CL 93* 91*  CO2 32 29  GLUCOSE 98 177*  BUN 20 16  CREATININE 1.14 1.14  CALCIUM 8.8* 8.9   GFR: Estimated Creatinine Clearance: 48.3 mL/min (by C-G formula based on SCr of 1.14 mg/dL). Liver Function Tests:  Recent Labs Lab 03/15/17 2257  AST 33  ALT 15*  ALKPHOS 84  BILITOT 1.2  PROT 6.4*  ALBUMIN 2.8*   No results for input(s): LIPASE, AMYLASE in the last 168 hours. No results for input(s): AMMONIA in the last 168 hours. Coagulation Profile: No results for input(s): INR, PROTIME in the last 168 hours. Cardiac Enzymes:  Recent Labs Lab 03/15/17 2257  TROPONINI <0.03   BNP (last 3 results) No results for input(s): PROBNP in the last 8760 hours. HbA1C: No results for input(s):  HGBA1C in the last 72 hours. CBG:  Recent Labs Lab 03/18/17 0822 03/19/17 0738 03/20/17 0813 03/21/17 0733 03/21/17 1114  GLUCAP 127* 114* 111* 76 113*   Lipid Profile: No results for input(s): CHOL, HDL, LDLCALC, TRIG, CHOLHDL, LDLDIRECT in the last 72 hours. Thyroid Function Tests: No results for input(s): TSH, T4TOTAL, FREET4, T3FREE, THYROIDAB in the last 72 hours. Anemia Panel: No results for input(s): VITAMINB12, FOLATE, FERRITIN, TIBC, IRON, RETICCTPCT in the last 72 hours. Sepsis Labs:  Recent Labs Lab 03/16/17 0428 03/17/17 0446  PROCALCITON <0.10 <0.10    Recent Results (from the past 240 hour(s))  Respiratory Panel by PCR     Status: None   Collection Time: 03/16/17  5:38 AM  Result Value Ref Range Status   Adenovirus NOT  DETECTED NOT DETECTED Final   Coronavirus 229E NOT DETECTED NOT DETECTED Final   Coronavirus HKU1 NOT DETECTED NOT DETECTED Final   Coronavirus NL63 NOT DETECTED NOT DETECTED Final   Coronavirus OC43 NOT DETECTED NOT DETECTED Final   Metapneumovirus NOT DETECTED NOT DETECTED Final   Rhinovirus / Enterovirus NOT DETECTED NOT DETECTED Final   Influenza A NOT DETECTED NOT DETECTED Final   Influenza B NOT DETECTED NOT DETECTED Final   Parainfluenza Virus 1 NOT DETECTED NOT DETECTED Final   Parainfluenza Virus 2 NOT DETECTED NOT DETECTED Final   Parainfluenza Virus 3 NOT DETECTED NOT DETECTED Final   Parainfluenza Virus 4 NOT DETECTED NOT DETECTED Final   Respiratory Syncytial Virus NOT DETECTED NOT DETECTED Final   Bordetella pertussis NOT DETECTED NOT DETECTED Final   Chlamydophila pneumoniae NOT DETECTED NOT DETECTED Final   Mycoplasma pneumoniae NOT DETECTED NOT DETECTED Final  Culture, sputum-assessment     Status: None   Collection Time: 03/16/17  6:10 AM  Result Value Ref Range Status   Specimen Description EXPECTORATED SPUTUM  Final   Special Requests NONE  Final   Sputum evaluation THIS SPECIMEN IS ACCEPTABLE FOR SPUTUM CULTURE  Final   Report Status 03/17/2017 FINAL  Final  Culture, respiratory (NON-Expectorated)     Status: None   Collection Time: 03/16/17  6:10 AM  Result Value Ref Range Status   Specimen Description EXPECTORATED SPUTUM  Final   Special Requests NONE Reflexed from [id number]  Final   Gram Stain   Final    MODERATE WBC PRESENT, PREDOMINANTLY MONONUCLEAR RARE SQUAMOUS EPITHELIAL CELLS PRESENT FEW GRAM POSITIVE COCCI IN PAIRS RARE GRAM POSITIVE RODS RARE GRAM NEGATIVE COCCI IN PAIRS RARE GRAM NEGATIVE RODS    Culture Consistent with normal respiratory flora.  Final   Report Status 03/19/2017 FINAL  Final         Radiology Studies: No results found.      Scheduled Meds: . ALPRAZolam  0.25 mg Oral TID  . feeding supplement (ENSURE ENLIVE)  237 mL  Oral TID  . morphine CONCENTRATE  10 mg Oral Q6H  . pantoprazole  40 mg Oral Daily  . senna  1 tablet Oral Daily  . sodium chloride flush  3 mL Intravenous Q12H  . tamsulosin  0.4 mg Oral Daily   Continuous Infusions:   LOS: 5 days    Time spent: > 35 minutes    Domenic Polite, MD [facility] Page via [email or web address], password [email or web address]  If 7PM-7AM, please contact night-coverage [email or web address] Password [email or web address] 03/21/2017, 1:45 PM

## 2017-03-21 NOTE — Progress Notes (Signed)
CSW following for residential hospice placement. Family prefers [facility]. CSW made referral to [facility], however they do not have beds available today. [facility] to follow up with family and notify CSW if bed becomes available.   CSW discussed with spouse and son about making referral to alternate hospice for a backup if no bed becomes available at [facility]. Family declined to make an additional referral at this time, but will consider referral to [facility] after discussing with other family members.   CSW to follow.  [name] [name], [place]

## 2017-03-21 NOTE — Progress Notes (Signed)
[facility] Liaison:  RN   Received request from Otelia Sergeant, for patient/family interest in [facility].  I did speak with spouse to acknowledge referral.  Unfortunately, we are unable to offer a bed at this time.  We will continue to update both family and LCSW for bed availability.    Thank you for this referral.  Edyth Gunnels, RN, [facility] Liaison [phone]  All hospital liaisons are now on [place].

## 2017-03-22 ENCOUNTER — Encounter (HOSPITAL_COMMUNITY): Payer: Self-pay | Admitting: *Deleted

## 2017-03-22 LAB — GLUCOSE, CAPILLARY: GLUCOSE-CAPILLARY: 98 mg/dL (ref 65–99)

## 2017-03-22 NOTE — Progress Notes (Signed)
No changes from 11/1, remains comfort care, awaiting [facility], not available today per CSW  Domenic Polite, MD

## 2017-03-22 NOTE — Progress Notes (Signed)
CSW informed by [facility] that [facility] does not have bed available today for patient. CSW discussed with patient and family at bedside. Family declines to refer to alternate hospice facilities. CSW made MD aware. CSW to continue to follow with [facility] and will support with discharge.  [name] [name], [place]

## 2017-03-22 NOTE — Progress Notes (Signed)
[facility] Liaison: RN   Received request from [place] for family interest in [facility]. Chart reviewed and  [name] [name] spoke with family and patient at bedside to acknowledge referral yesterday. Unfortunately,  [facility] is not able to offer a room today. [name] [name], CSW is aware. Hospital Liaison will follow up with CSW and family tomorrow if room becomes available. Please do hesitate to call with questions.   Thank you,   [name] Gunnels, RN, [facility] Liaison  [phone]  All [place] are on AMION

## 2017-03-23 LAB — GLUCOSE, CAPILLARY: GLUCOSE-CAPILLARY: 103 mg/dL — AB (ref 65–99)

## 2017-03-23 NOTE — Progress Notes (Signed)
PROGRESS NOTE    [name] [name]  ZDG:[id number] DOB: 1935-04-09 DOA: 03/15/2017 PCP: [name], MD    Brief Narrative:  82 y/o with history of chronic respiratory failure secondary to advanced pulmonary fibrosis was admitted with acute on chronic hypoxic respiratory failure secondary to IPF flare  -Seen by pulmonary and palliative care , continue comfort focused care, awaiting residential hospice  Assessment & Plan:   1. Acute on chronic hypoxic respiratory failure -Due to IPF flare 2. Advanced idiopathic pulmonary fibrosis 3. Chronic hyponatremia 4. Chronic pain 5. Anxiety -Seen by pulmonary this admission, hospice and comfort focused care was recommended -Palliative following, continue comfort care -[place] for residential hospice/[facility] when bed available   Code Status: DNR Family Communication: Wife and son at bedside Disposition Plan: [place] [facility] when  bed available   Consultants:   Palliative care   Procedures: none  Subjective: -No new issues, morphine helping  Objective: Vitals:   03/22/17 1300 03/22/17 2159 03/23/17 0337 03/23/17 0900  BP:  (!) 96/58 (!) 97/59 (!) 92/58  Pulse:  87 91 92  Resp: (!) 21 18 18 19   Temp: 97.9 F (36.6 C) 98.1 F (36.7 C) 98.4 F (36.9 C) 98.5 F (36.9 C)  TempSrc: Axillary Oral Oral Oral  SpO2:  100% 97%   Weight:   72.1 kg (159 lb)   Height:        Intake/Output Summary (Last 24 hours) at 03/23/17 1242 Last data filed at 03/23/17 0340  Gross per 24 hour  Intake              240 ml  Output              625 ml  Net             -385 ml   Filed Weights   03/21/17 0627 03/22/17 0314 03/23/17 0337  Weight: 72.1 kg (159 lb) 73.9 kg (163 lb) 72.1 kg (159 lb)    Examination:  Alert and awake, resting comfortably,  lungs with diffuse crackles Extremities trace edema Abdomen is soft and mildly distended   Data Reviewed: I have personally reviewed following labs and imaging studies  CBC: No  results for input(s): WBC, NEUTROABS, HGB, HCT, MCV, PLT in the last 168 hours. Basic Metabolic Panel: No results for input(s): NA, K, CL, CO2, GLUCOSE, BUN, CREATININE, CALCIUM, MG, PHOS in the last 168 hours. GFR: Estimated Creatinine Clearance: 48.3 mL/min (by C-G formula based on SCr of 1.14 mg/dL). Liver Function Tests: No results for input(s): AST, ALT, ALKPHOS, BILITOT, PROT, ALBUMIN in the last 168 hours. No results for input(s): LIPASE, AMYLASE in the last 168 hours. No results for input(s): AMMONIA in the last 168 hours. Coagulation Profile: No results for input(s): INR, PROTIME in the last 168 hours. Cardiac Enzymes: No results for input(s): CKTOTAL, CKMB, CKMBINDEX, TROPONINI in the last 168 hours. BNP (last 3 results) No results for input(s): PROBNP in the last 8760 hours. HbA1C: No results for input(s): HGBA1C in the last 72 hours. CBG:  Recent Labs Lab 03/20/17 0813 03/21/17 0733 03/21/17 1114 03/22/17 0814 03/23/17 0752  GLUCAP 111* 76 113* 98 103*   Lipid Profile: No results for input(s): CHOL, HDL, LDLCALC, TRIG, CHOLHDL, LDLDIRECT in the last 72 hours. Thyroid Function Tests: No results for input(s): TSH, T4TOTAL, FREET4, T3FREE, THYROIDAB in the last 72 hours. Anemia Panel: No results for input(s): VITAMINB12, FOLATE, FERRITIN, TIBC, IRON, RETICCTPCT in the last 72 hours. Sepsis Labs:  Recent Labs  Lab 03/17/17 0446  PROCALCITON <0.10    Recent Results (from the past 240 hour(s))  Respiratory Panel by PCR     Status: None   Collection Time: 03/16/17  5:38 AM  Result Value Ref Range Status   Adenovirus NOT DETECTED NOT DETECTED Final   Coronavirus 229E NOT DETECTED NOT DETECTED Final   Coronavirus HKU1 NOT DETECTED NOT DETECTED Final   Coronavirus NL63 NOT DETECTED NOT DETECTED Final   Coronavirus OC43 NOT DETECTED NOT DETECTED Final   Metapneumovirus NOT DETECTED NOT DETECTED Final   Rhinovirus / Enterovirus NOT DETECTED NOT DETECTED Final    Influenza A NOT DETECTED NOT DETECTED Final   Influenza B NOT DETECTED NOT DETECTED Final   Parainfluenza Virus 1 NOT DETECTED NOT DETECTED Final   Parainfluenza Virus 2 NOT DETECTED NOT DETECTED Final   Parainfluenza Virus 3 NOT DETECTED NOT DETECTED Final   Parainfluenza Virus 4 NOT DETECTED NOT DETECTED Final   Respiratory Syncytial Virus NOT DETECTED NOT DETECTED Final   Bordetella pertussis NOT DETECTED NOT DETECTED Final   Chlamydophila pneumoniae NOT DETECTED NOT DETECTED Final   Mycoplasma pneumoniae NOT DETECTED NOT DETECTED Final  Culture, sputum-assessment     Status: None   Collection Time: 03/16/17  6:10 AM  Result Value Ref Range Status   Specimen Description EXPECTORATED SPUTUM  Final   Special Requests NONE  Final   Sputum evaluation THIS SPECIMEN IS ACCEPTABLE FOR SPUTUM CULTURE  Final   Report Status 03/17/2017 FINAL  Final  Culture, respiratory (NON-Expectorated)     Status: None   Collection Time: 03/16/17  6:10 AM  Result Value Ref Range Status   Specimen Description EXPECTORATED SPUTUM  Final   Special Requests NONE Reflexed from [id number]  Final   Gram Stain   Final    MODERATE WBC PRESENT, PREDOMINANTLY MONONUCLEAR RARE SQUAMOUS EPITHELIAL CELLS PRESENT FEW GRAM POSITIVE COCCI IN PAIRS RARE GRAM POSITIVE RODS RARE GRAM NEGATIVE COCCI IN PAIRS RARE GRAM NEGATIVE RODS    Culture Consistent with normal respiratory flora.  Final   Report Status 03/19/2017 FINAL  Final         Radiology Studies: No results found.      Scheduled Meds: . ALPRAZolam  0.25 mg Oral TID  . feeding supplement (ENSURE ENLIVE)  237 mL Oral TID  . morphine CONCENTRATE  10 mg Oral Q6H  . pantoprazole  40 mg Oral Daily  . senna  1 tablet Oral Daily  . sodium chloride flush  3 mL Intravenous Q12H  . tamsulosin  0.4 mg Oral Daily   Continuous Infusions:   LOS: 7 days    Time spent: > 35 minutes    Domenic Polite, MD [facility] Page via [email or web address], password  [email or web address]  If 7PM-7AM, please contact night-coverage [email or web address] Password [email or web address] 03/23/2017, 12:42 PM

## 2017-03-23 NOTE — Progress Notes (Signed)
1800--[facility] [place] RN note  Met with patient and family this evening to inform them that [facility] is not able to offer a room today. Denyse Amass, [place] notified this afternoon. Hospital Liaison will follow up with CSW and family tomorrow if room becomes available.  Please do not hesitate to call with any hospice related questions or concerns.  Thank you, Margaretmary Eddy, RN, [place] [facility] Liaison [phone]  [place] are on AMION.

## 2017-03-24 LAB — GLUCOSE, CAPILLARY: GLUCOSE-CAPILLARY: 97 mg/dL (ref 65–99)

## 2017-03-24 MED ORDER — HYDROCODONE-HOMATROPINE 5-1.5 MG/5ML PO SYRP
5.0000 mL | ORAL_SOLUTION | ORAL | 0 refills | Status: DC
Start: 2017-03-24 — End: 2017-03-26

## 2017-03-24 MED ORDER — MORPHINE SULFATE (CONCENTRATE) 10 MG/0.5ML PO SOLN: 10.0000 mg | ORAL | 0 refills | Status: DC | PRN

## 2017-03-24 MED ORDER — MORPHINE SULFATE (CONCENTRATE) 10 MG/0.5ML PO SOLN: 10.0000 mg | Freq: Four times a day (QID) | ORAL | 0 refills | Status: DC

## 2017-03-24 MED ORDER — ALPRAZOLAM 0.5 MG PO TABS: 0.5000 mg | ORAL_TABLET | Freq: Three times a day (TID) | ORAL | 0 refills | Status: DC | PRN

## 2017-03-24 MED ORDER — TEMAZEPAM 15 MG PO CAPS: 15.0000 mg | ORAL_CAPSULE | Freq: Every evening | ORAL | 0 refills | Status: AC | PRN

## 2017-03-24 MED ORDER — SENNA 8.6 MG PO TABS: 1.0000 | ORAL_TABLET | Freq: Every day | ORAL | 0 refills | Status: AC

## 2017-03-24 NOTE — Discharge Summary (Signed)
Physician Discharge Summary  [name] [name] EHM:[id number] DOB: 12-Mar-1935 DOA: 03/15/2017  PCP: [name], MD  Admit date: 03/15/2017 Discharge date: 03/26/2017  Time spent: 35 minutes  Recommendations for Outpatient Follow-up:  1. Residential hospice for end-of-life care   Discharge Diagnoses:  Principal Problem:   Acute on chronic respiratory failure with hypoxia ([facility]) Active Problems:   Essential hypertension   IPF (idiopathic pulmonary fibrosis) ([facility])   Hyponatremia   Chronic pain syndrome   Anxiety   Palliative care by specialist   Advance care planning   Shortness of breath   Discharge Condition: were  Diet recommendation: comfort feeds  Filed Weights   03/22/17 0314 03/23/17 0337 03/24/17 0611  Weight: 73.9 kg (163 lb) 72.1 kg (159 lb) 75.4 kg (166 lb 3.2 oz)    History of present illness:  82 y/o with history of chronic respiratory failure secondary to advanced pulmonary fibrosis was admitted with acute on chronic hypoxic respiratory failure secondary to IPF flare   Hospital Course:  1. Acute on chronic hypoxic respiratory failure -Due to IPF flare 2. Advanced idiopathic pulmonary fibrosis 3. Chronic hyponatremia 4. Chronic pain 5. Anxiety -Seen by pulmonary this admission, hospice and comfort focused care was recommended -Palliative consulted, now DO NOT RESUSCITATE and comfort measures only -Plan for residential hospice for end of life care    Discharge Exam: Vitals:   03/24/17 0611 03/24/17 0800  BP: (!) 100/50 (!) 118/58  Pulse: 87   Resp: 14   Temp: 97.6 F (36.4 C)   SpO2: 98% 100%    General: frail, ill male, uncomfortable appearing, mild respiratory distress Cardiovascular: s1-S2, tachycardic Respiratory: crackles at both bases  Discharge Instructions    Current Discharge Medication List    START taking these medications   Details  !! Morphine Sulfate (MORPHINE CONCENTRATE) 10 MG/0.5ML SOLN concentrated solution Take  0.5 mLs (10 mg total) every 6 (six) hours by mouth. Qty: 20 mL, Refills: 0    !! Morphine Sulfate (MORPHINE CONCENTRATE) 10 MG/0.5ML SOLN concentrated solution Take 0.5 mLs (10 mg total) every 2 (two) hours as needed by mouth for moderate pain or severe pain (dyspnea). Qty: 20 mL, Refills: 0    senna (SENOKOT) 8.6 MG TABS tablet Take 1 tablet (8.6 mg total) daily by mouth. Qty: 120 each, Refills: 0    temazepam (RESTORIL) 15 MG capsule Take 1 capsule (15 mg total) at bedtime as needed by mouth for sleep. Qty: 10 capsule, Refills: 0     !! - Potential duplicate medications found. Please discuss with provider.    CONTINUE these medications which have CHANGED   Details  ALPRAZolam (XANAX) 0.5 MG tablet Take 1 tablet (0.5 mg total) 3 (three) times daily as needed by mouth for anxiety. Qty: 10 tablet, Refills: 0    HYDROcodone-homatropine (HYCODAN) 5-1.5 MG/5ML syrup Take 5 mLs See admin instructions by mouth. Every 6 hours as needed for cough. Take every evening before bed. Qty: 20 mL, Refills: 0      CONTINUE these medications which have NOT CHANGED   Details  feeding supplement, ENSURE ENLIVE, (ENSURE ENLIVE) LIQD Take 237 mLs by mouth 2 (two) times daily between meals. Qty: 237 mL, Refills: 12    omeprazole (PRILOSEC) 20 MG capsule Take 20 mg by mouth daily. 30 minutes before breakfast    tamsulosin (FLOMAX) 0.4 MG CAPS capsule Take 0.4 mg by mouth daily.      STOP taking these medications     aspirin 325 MG tablet  atenolol (TENORMIN) 25 MG tablet      atorvastatin (LIPITOR) 80 MG tablet      guaiFENesin (MUCINEX) 600 MG 12 hr tablet      KLOR-CON M10 10 MEQ tablet      morphine (MSIR) 15 MG tablet      Pirfenidone 267 MG CAPS      telmisartan-hydrochlorothiazide (MICARDIS HCT) 80-25 MG per tablet        No Known Allergies    The results of significant diagnostics from this hospitalization (including imaging, microbiology, ancillary and laboratory) are  listed below for reference.    Significant Diagnostic Studies: Dg Chest 2 View  Result Date: 03/15/2017 CLINICAL DATA:  Acute onset of shortness of breath. Current history of pulmonary fibrosis. Initial encounter. EXAM: CHEST  2 VIEW COMPARISON:  Chest radiograph performed 03/05/2016 FINDINGS: The lungs are mildly hypoexpanded. Chronic bilateral fibrotic changes are similar in appearance to the prior study. No definite superimposed focal airspace consolidation is seen. No pleural effusion or pneumothorax is identified. The heart is borderline normal in size. No acute osseous abnormalities are seen. IMPRESSION: Lungs mildly hypoexpanded. Stable appearance to chronic pulmonary fibrosis. No superimposed focal airspace consolidation seen. Electronically Signed   By: Garald Balding M.D.   On: 03/15/2017 21:41   Ct Angio Chest Pe W Or Wo Contrast  Result Date: 03/16/2017 CLINICAL DATA:  Sudden onset shortness of breath EXAM: CT ANGIOGRAPHY CHEST WITH CONTRAST TECHNIQUE: Multidetector CT imaging of the chest was performed using the standard protocol during bolus administration of intravenous contrast. Multiplanar CT image reconstructions and MIPs were obtained to evaluate the vascular anatomy. CONTRAST:  71 mL Isovue 370 intravenous COMPARISON:  CT 03/16/2017, 07/05/2008 FINDINGS: Cardiovascular: Satisfactory opacification of the pulmonary arteries to the segmental level. Limited evaluation for emboli within the lower lobes, due to significant respiratory motion artifact. Allowing for this, no definite acute filling defects to suggest pulmonary embolus. Prominent central pulmonary vessels suggesting pulmonary hypertension. No aneurysmal dilatation of the aorta. Atherosclerotic calcifications. Common origin of the right brachiocephalic and left common carotid artery. Minimal coronary calcification. Normal heart size. No pericardial effusion. Mediastinum/Nodes: Midline trachea. No thyroid mass. Re- demonstrated  slightly enlarged mediastinal lymph nodes, for example pretracheal lymph node measures 13 mm. Moderate hiatal hernia. Lungs/Pleura: Re- demonstrated extensive pulmonary fibrosis with honeycombing and bronchiectasis. Right middle lobe pulmonary nodule more difficult to visualize due to respiratory motion. No pleural effusion, consolidation or pneumothorax Upper Abdomen: No acute interval changes.  Atrophic left kidney. Musculoskeletal: No chest wall abnormality. No acute or significant osseous findings. Review of the MIP images confirms the above findings. IMPRESSION: 1. Limited evaluation for emboli in the lower lobes secondary to respiratory motion. Allowing for this, no definite acute pulmonary embolus is visualized. 2. Prominent central pulmonary arteries consistent with pulmonary hypertension 3. Extensive pulmonary fibrosis as detailed on chest CT 03/16/2017. 4. Re- demonstrated mild mediastinal adenopathy 5. Atrophic left kidney Aortic Atherosclerosis (ICD10-I70.0). Electronically Signed   By: Donavan Foil M.D.   On: 03/16/2017 19:15   Ct Chest High Resolution  Result Date: 03/16/2017 CLINICAL DATA:  Worsening shortness of breath over the last couple days. Cough. History of pulmonary fibrosis. EXAM: CT CHEST WITHOUT CONTRAST TECHNIQUE: Multidetector CT imaging of the chest was performed following the standard protocol without intravenous contrast. High resolution imaging of the lungs, as well as inspiratory and expiratory imaging, was performed. COMPARISON:  Radiographs 03/15/2017 and 03/05/2016. Chest CT 07/05/2008. A more recent chest CT performed 10/12/2010 unavailable. FINDINGS: Cardiovascular: Mild  atherosclerosis of aorta, great vessels and coronary arteries. There is central enlargement of the pulmonary arteries consistent with pulmonary arterial hypertension. No acute vascular findings are demonstrated on noncontrast imaging. The heart size is normal. There is no pericardial effusion.  Mediastinum/Nodes: There are several mildly enlarged mediastinal and hilar lymph nodes, including a 14 mm right paratracheal node on image 44, a 14 mm low right paratracheal node on image 53 and a 10 mm subcarinal node on image 76. Hilar assessment is limited without contrast. No axillary adenopathy. A moderate size hiatal hernia has enlarged compared with the prior study. Lungs/Pleura: No pleural effusion or pneumothorax. There are progressive changes of severe pulmonary fibrosis with subpleural reticulation, fissural thickening, traction bronchiectasis, architectural distortion and extensive honeycomb formation in both lungs. 4 mm right middle lobe nodule on image 74 is stable. There is a small calcified right upper lobe granuloma. There are no new or enlarging pulmonary nodules. There is no focal airspace disease or ground-glass opacity. Inspiration and expiration imaging demonstrates no significant air trapping. Upper abdomen: The visualized upper abdomen appears stable without acute findings. Musculoskeletal/Chest wall: There is no chest wall mass or suspicious osseous finding. IMPRESSION: 1. Progressive changes of severe pulmonary fibrosis (usual interstitial pneumonitis pattern) compared with available chest CT from 2010. 2. No definite acute superimposed findings or suspicious pulmonary nodules. 3. Mildly prominent mediastinal and hilar lymph nodes are nonspecific, likely reactive. 4. Central enlargement of the pulmonary arteries, consistent with pulmonary arterial hypertension. Aortic and coronary artery atherosclerosis. 5. Moderate size hiatal hernia. Electronically Signed   By: Richardean Sale M.D.   On: 03/16/2017 09:24    Microbiology: Recent Results (from the past 240 hour(s))  Respiratory Panel by PCR     Status: None   Collection Time: 03/16/17  5:38 AM  Result Value Ref Range Status   Adenovirus NOT DETECTED NOT DETECTED Final   Coronavirus 229E NOT DETECTED NOT DETECTED Final    Coronavirus HKU1 NOT DETECTED NOT DETECTED Final   Coronavirus NL63 NOT DETECTED NOT DETECTED Final   Coronavirus OC43 NOT DETECTED NOT DETECTED Final   Metapneumovirus NOT DETECTED NOT DETECTED Final   Rhinovirus / Enterovirus NOT DETECTED NOT DETECTED Final   Influenza A NOT DETECTED NOT DETECTED Final   Influenza B NOT DETECTED NOT DETECTED Final   Parainfluenza Virus 1 NOT DETECTED NOT DETECTED Final   Parainfluenza Virus 2 NOT DETECTED NOT DETECTED Final   Parainfluenza Virus 3 NOT DETECTED NOT DETECTED Final   Parainfluenza Virus 4 NOT DETECTED NOT DETECTED Final   Respiratory Syncytial Virus NOT DETECTED NOT DETECTED Final   Bordetella pertussis NOT DETECTED NOT DETECTED Final   Chlamydophila pneumoniae NOT DETECTED NOT DETECTED Final   Mycoplasma pneumoniae NOT DETECTED NOT DETECTED Final  Culture, sputum-assessment     Status: None   Collection Time: 03/16/17  6:10 AM  Result Value Ref Range Status   Specimen Description EXPECTORATED SPUTUM  Final   Special Requests NONE  Final   Sputum evaluation THIS SPECIMEN IS ACCEPTABLE FOR SPUTUM CULTURE  Final   Report Status 03/17/2017 FINAL  Final  Culture, respiratory (NON-Expectorated)     Status: None   Collection Time: 03/16/17  6:10 AM  Result Value Ref Range Status   Specimen Description EXPECTORATED SPUTUM  Final   Special Requests NONE Reflexed from [id number]  Final   Gram Stain   Final    MODERATE WBC PRESENT, PREDOMINANTLY MONONUCLEAR RARE SQUAMOUS EPITHELIAL CELLS PRESENT FEW GRAM POSITIVE COCCI IN PAIRS RARE  GRAM POSITIVE RODS RARE GRAM NEGATIVE COCCI IN PAIRS RARE GRAM NEGATIVE RODS    Culture Consistent with normal respiratory flora.  Final   Report Status 03/19/2017 FINAL  Final     Labs: Basic Metabolic Panel: No results for input(s): NA, K, CL, CO2, GLUCOSE, BUN, CREATININE, CALCIUM, MG, PHOS in the last 168 hours. Liver Function Tests: No results for input(s): AST, ALT, ALKPHOS, BILITOT, PROT, ALBUMIN in the  last 168 hours. No results for input(s): LIPASE, AMYLASE in the last 168 hours. No results for input(s): AMMONIA in the last 168 hours. CBC: No results for input(s): WBC, NEUTROABS, HGB, HCT, MCV, PLT in the last 168 hours. Cardiac Enzymes: No results for input(s): CKTOTAL, CKMB, CKMBINDEX, TROPONINI in the last 168 hours. BNP: BNP (last 3 results) Recent Labs    03/15/17 2257  BNP 21.8    ProBNP (last 3 results) No results for input(s): PROBNP in the last 8760 hours.  CBG: Recent Labs  Lab 03/21/17 0733 03/21/17 1114 03/22/17 0814 03/23/17 0752 03/24/17 0751  GLUCAP 76 113* 98 103* 97       Signed:  Duha Abair MD.  [facility] 03/24/2017, 11:20 AM

## 2017-03-24 NOTE — Progress Notes (Signed)
No changes, continue comfort care Awaiting [facility] bed, d/w Social work  Domenic Polite, MD

## 2017-03-25 LAB — GLUCOSE, CAPILLARY: GLUCOSE-CAPILLARY: 85 mg/dL (ref 65–99)

## 2017-03-25 NOTE — Care Management Important Message (Signed)
Important Message  Patient Details  Name: [name] [name] MRN: [id number] Date of Birth: 1934/08/22   Medicare Important Message Given:  Yes    Xareni Kelch Abena 03/25/2017, 11:02 AM

## 2017-03-25 NOTE — Progress Notes (Signed)
Daily Progress Note   Patient Name: [name] [name]       Date: 03/25/2017 DOB: May 14, 1935  Age: 82 y.o. MRN#: [id number] Attending Physician: [name] Polite, MD Primary Care Physician: [name] Amel, MD Admit Date: 03/15/2017  Reason for Consultation/Follow-up: Disposition, Establishing goals of care, Hospice Evaluation, Inpatient hospice referral and Non pain symptom management  Subjective: Patient resting in bed, wife and visitors at bedside. Report a good night, slept well.   Length of Stay: 9  Current Medications: Scheduled Meds:  . ALPRAZolam  0.25 mg Oral TID  . feeding supplement (ENSURE ENLIVE)  237 mL Oral TID  . morphine CONCENTRATE  10 mg Oral Q6H  . pantoprazole  40 mg Oral Daily  . senna  1 tablet Oral Daily  . sodium chloride flush  3 mL Intravenous Q12H  . tamsulosin  0.4 mg Oral Daily    Continuous Infusions:   PRN Meds: acetaminophen **OR** acetaminophen, ALPRAZolam, alum & mag hydroxide-simeth, bisacodyl, HYDROcodone-homatropine, LORazepam, morphine injection, morphine CONCENTRATE, ondansetron **OR** ondansetron (ZOFRAN) IV, temazepam  Physical Exam  Constitutional: He is oriented to person, place, and time. He appears lethargic. He is cooperative. He is easily aroused. He appears ill.  Cardiovascular: Tachycardia present.  Pulmonary/Chest: No accessory muscle usage. Tachypnea noted. No respiratory distress.  Neurological: He is oriented to person, place, and time and easily aroused. He appears lethargic.  Skin: Skin is warm and dry. Capillary refill takes less than 2 seconds.  Psychiatric: His speech is delayed. He is slowed and withdrawn.            Vital Signs: BP 134/71 (BP Location: Right Arm)   Pulse 72   Temp 98.3 F (36.8 C) (Axillary)   Resp 17   Ht  '5\' 7"'$  (1.702 m)   Wt 74.4 kg (164 lb 1.6 oz)   SpO2 100%   BMI 25.70 kg/m  SpO2: SpO2: 100 % O2 Device: O2 Device: Nasal Cannula O2 Flow Rate: O2 Flow Rate (L/min): 12 L/min  Intake/output summary:   Intake/Output Summary (Last 24 hours) at 03/25/2017 1051 Last data filed at 03/25/2017 0100 Gross per 24 hour  Intake -  Output 250 ml  Net -250 ml   LBM: Last BM Date: 03/24/17 Baseline Weight: Weight: 72.8 kg (160 lb 8 oz) Most recent weight: Weight: 74.4 kg (164 lb 1.6 oz)       Palliative Assessment/Data: 30%    Flowsheet Rows     Most Recent Value  Intake Tab  Referral Department  Critical care  Unit at Time of Referral  Med/Surg Unit  Palliative Care Primary Diagnosis  Pulmonary  Date Notified  03/17/17  Palliative Care Type  New Palliative care  Reason for referral  Non-pain Symptom, Clarify Goals of Care, Counsel Regarding Hospice, Psychosocial or Spiritual support  Date of Admission  03/15/17  Date first seen by Palliative Care  03/17/17  # of days Palliative referral response time  0 Day(s)  # of days IP prior to Palliative referral  2  Clinical Assessment  Palliative Performance Scale Score  30%  Pain Max last 24 hours  Not able to report  Pain Min Last 24 hours  Not able to report  Dyspnea Max Last 24 Hours  Not able to report  Dyspnea Min Last 24 hours  Not able to report  Nausea Max Last 24 Hours  Not able to report  Nausea Min Last 24 Hours  Not able to report  Anxiety Max Last 24 Hours  Not able to report  Anxiety Min Last 24 Hours  Not able to report  Other Max Last 24 Hours  Not able to report  Psychosocial & Spiritual Assessment  Palliative Care Outcomes  Patient/Family meeting held?  Yes  Who was at the meeting?  pt, wife and 2 sons  Palliative Care Outcomes  Improved non-pain symptom therapy, Clarified goals of care, Counseled regarding hospice, Provided advance care planning, Changed to focus on comfort  Patient/Family wishes: Interventions  discontinued/not started   Mechanical Ventilation  Palliative Care follow-up planned  Yes, Facility      Patient Active Problem List   Diagnosis Date Noted  . Shortness of breath   . Advance care planning   . Palliative care by specialist   . Anxiety 03/16/2017  . Dyspnea on exertion   . Community acquired pneumonia   . Sepsis ([place])   . Acute renal failure superimposed on stage 3 chronic kidney disease ([place])   . Chronic pain syndrome 03/06/2016  . Influenza 03/05/2016  . Hyponatremia 03/05/2016  . CKD (chronic kidney disease) stage 3, GFR 30-59 ml/min ([facility]) 03/05/2016  . Thrombocytopenia ([place]) 03/05/2016  . Acute on chronic respiratory failure with hypoxia ([place]) 03/05/2016  . Hx of bladder cancer 08/26/2014  . Patient in clinical research study 07/18/2014  . Chronic respiratory failure with hypoxia ([place]) 06/17/2014  . IPF (idiopathic pulmonary fibrosis) ([place]) 06/16/2014  . Femoral hernia, left 08/27/2011  . Bilateral inguinal herniae (BIH), R>L 06/11/2011  . BRONCHITIS, ACUTE 06/08/2010  . Hyperlipidemia 06/04/2007  . Essential hypertension 06/04/2007  . G E R D 06/04/2007  . BRONCHIECTASIS 03/06/2007  . Pulmonary fibrosis ([place]) 03/06/2007    Palliative Care Assessment & Plan   HPI: 82 y.o.malewith PMHof interstitial pulmonary fibrosis, HLD, HTN, andcough,admitted on 10/26/2018with worsening shortness of breath. Patient has been living with interstitial pulmonary fibrosis for approximately 9 years and has been included in clinical trials at [facility]. His disease has progressed to the point where he is afraid to fall asleep at night. He is unable to lie flat. He has been on morphine 15 mg at bedtime for approximately 1 year. Additionally he has taken Xanax 0.25 mg for approximately 6 months, typically twice daily. Spouse shares that sometimes she has had to "cheat" and give him 1-1/2 tablets because of severe anxiety. Patient reports prior to admission that morphine 15  mg at bedtime was no longer managing his symptoms nor was itsedating.   Assessment: Met with patient and wife at bedside. Wife reports patient is much sleepier than previous days. Last dose of morphine and xanax about eight hours ago. Patient appears comfortable. Emotional support provided.  Recommendations/Plan:  Transition to residential hospice when bed is available - will need doses of morphine/xanax prior to transfer for symptom control  Utilize prn morphine,xanax, and ativanfor dyspnea and anxiety, continue scheduled morphine and xanax  Continue hycodan for cough  Scheduled senna to prevent constipation, prn dulcolax  Goals of Care and Additional Recommendations:  Limitations on Scope of Treatment: Avoid Hospitalization, Full Comfort Care, Minimize Medications, Initiate Comfort Feeding, No Artificial Feeding, No Blood Transfusions, No Diagnostics, No Glucose Monitoring, No IV Antibiotics, No IV Fluids and No Lab Draws  Code Status:  DNR  Prognosis:   < 2 weeks  Discharge Planning:  Hospice facility vs hospital death  Care plan was discussed with patient, wife, bedside nurse  Thank you for allowing the Palliative Medicine Team to assist in the care of this patient.   Total Time 15 minutes Prolonged Time Billed  no       Greater than 50%  of this time was spent counseling and coordinating care related to the above assessment and plan.  Juel Burrow, DNP, AGNP-C Palliative Medicine Team Team Phone # [phone]

## 2017-03-25 NOTE — Progress Notes (Signed)
CSW offered alternative hospice choice to patient's family last week and they refused other facilities besides [facility]. CSW spoke to [facility] today and they continue to not have beds available. CSW to follow.  Estanislado Emms, [place]

## 2017-03-25 NOTE — Progress Notes (Signed)
Patient seen and examined, no changes appear stable and comfortable -Palliative following -Awaiting [facility]  Domenic Polite, MD

## 2017-03-26 ENCOUNTER — Encounter (HOSPITAL_COMMUNITY): Payer: Self-pay

## 2017-03-26 LAB — GLUCOSE, CAPILLARY: Glucose-Capillary: 87 mg/dL (ref 65–99)

## 2017-03-26 MED ORDER — MORPHINE SULFATE (CONCENTRATE) 10 MG/0.5ML PO SOLN: 10.0000 mg | Freq: Four times a day (QID) | ORAL | 0 refills | Status: AC

## 2017-03-26 MED ORDER — HYDROCODONE-HOMATROPINE 5-1.5 MG/5ML PO SYRP: 5.0000 mL | ORAL_SOLUTION | ORAL | 0 refills | Status: AC

## 2017-03-26 MED ORDER — MORPHINE SULFATE (CONCENTRATE) 10 MG/0.5ML PO SOLN: 10.0000 mg | ORAL | 0 refills | Status: AC | PRN

## 2017-03-26 MED ORDER — ALPRAZOLAM 0.5 MG PO TABS: 0.5000 mg | ORAL_TABLET | Freq: Three times a day (TID) | ORAL | 0 refills | Status: AC | PRN

## 2017-03-26 NOTE — Consult Note (Signed)
[facility]: Met with pt and family spouse and son. There was also a son who lives in [place] that I spoke to on the phone-- Gerald Stabs. Explained services that hospice offered at hospcie home and family and pt in agreement that this is his wishes. Pt is alert and oriented and expresses his wishes to not gasp for air at end of life. Spoke to my Market researcher to review the case and she approved pt to come to hospice home. Offered the bed to the family and they accepted. Webb Silversmith RN [phone]

## 2017-03-26 NOTE — Discharge Summary (Signed)
Physician Discharge Summary  [name] [name] BJS:[id number] DOB: 16-Dec-1934 DOA: 03/15/2017  PCP: [name], MD  Admit date: 03/15/2017 Discharge date: 03/26/2017  Time spent: 35 minutes  Recommendations for Outpatient Follow-up:  1. Residential hospice for end-of-life care   Discharge Diagnoses:  Principal Problem:   Acute on chronic respiratory failure with hypoxia ([place]) Active Problems:   Essential hypertension   IPF (idiopathic pulmonary fibrosis) (HCC)   Hyponatremia   Chronic pain syndrome   Anxiety   Palliative care by specialist   Advance care planning   Shortness of breath   Discharge Condition: were  Diet recommendation: comfort feeds  Filed Weights   03/24/17 0611 03/25/17 0414 03/26/17 0449  Weight: 75.4 kg (166 lb 3.2 oz) 74.4 kg (164 lb 1.6 oz) 73.5 kg (162 lb)    History of present illness:  82 y/o with history of chronic respiratory failure secondary to advanced pulmonary fibrosis was admitted with acute on chronic hypoxic respiratory failure secondary to IPF flare   Hospital Course:  1. Acute on chronic hypoxic respiratory failure -Due to IPF flare 2. Advanced idiopathic pulmonary fibrosis 3. Chronic hyponatremia 4. Chronic pain 5. Anxiety -Seen by pulmonary this admission, hospice and comfort focused care was recommended -Palliative consulted, now DO NOT RESUSCITATE and comfort measures only -Plan for residential hospice for end of life care    Discharge Exam: Vitals:   03/26/17 0700 03/26/17 1100  BP: 130/67 99/72  Pulse: 81 99  Resp: (!) 21 (!) 22  Temp: 98.1 F (36.7 C) 97.9 F (36.6 C)  SpO2: 100% 100%    General: frail, ill male, uncomfortable appearing, mild respiratory distress Cardiovascular: s1-S2, tachycardic Respiratory: crackles at both bases  Discharge Instructions    Current Discharge Medication List    START taking these medications   Details  !! Morphine Sulfate (MORPHINE CONCENTRATE) 10 MG/0.5ML  SOLN concentrated solution Take 0.5 mLs (10 mg total) every 6 (six) hours by mouth. Qty: 20 mL, Refills: 0    !! Morphine Sulfate (MORPHINE CONCENTRATE) 10 MG/0.5ML SOLN concentrated solution Take 0.5 mLs (10 mg total) every 2 (two) hours as needed by mouth for moderate pain or severe pain (dyspnea). Qty: 20 mL, Refills: 0    senna (SENOKOT) 8.6 MG TABS tablet Take 1 tablet (8.6 mg total) daily by mouth. Qty: 120 each, Refills: 0    temazepam (RESTORIL) 15 MG capsule Take 1 capsule (15 mg total) at bedtime as needed by mouth for sleep. Qty: 10 capsule, Refills: 0     !! - Potential duplicate medications found. Please discuss with provider.    CONTINUE these medications which have CHANGED   Details  ALPRAZolam (XANAX) 0.5 MG tablet Take 1 tablet (0.5 mg total) 3 (three) times daily as needed by mouth for anxiety. Qty: 10 tablet, Refills: 0    HYDROcodone-homatropine (HYCODAN) 5-1.5 MG/5ML syrup Take 5 mLs See admin instructions by mouth. Every 6 hours as needed for cough. Take every evening before bed. Qty: 20 mL, Refills: 0      CONTINUE these medications which have NOT CHANGED   Details  feeding supplement, ENSURE ENLIVE, (ENSURE ENLIVE) LIQD Take 237 mLs by mouth 2 (two) times daily between meals. Qty: 237 mL, Refills: 12    omeprazole (PRILOSEC) 20 MG capsule Take 20 mg by mouth daily. 30 minutes before breakfast    tamsulosin (FLOMAX) 0.4 MG CAPS capsule Take 0.4 mg by mouth daily.      STOP taking these medications  aspirin 325 MG tablet      atenolol (TENORMIN) 25 MG tablet      atorvastatin (LIPITOR) 80 MG tablet      guaiFENesin (MUCINEX) 600 MG 12 hr tablet      KLOR-CON M10 10 MEQ tablet      morphine (MSIR) 15 MG tablet      Pirfenidone 267 MG CAPS      telmisartan-hydrochlorothiazide (MICARDIS HCT) 80-25 MG per tablet        No Known Allergies    The results of significant diagnostics from this hospitalization (including imaging, microbiology,  ancillary and laboratory) are listed below for reference.    Significant Diagnostic Studies: Dg Chest 2 View  Result Date: 03/15/2017 CLINICAL DATA:  Acute onset of shortness of breath. Current history of pulmonary fibrosis. Initial encounter. EXAM: CHEST  2 VIEW COMPARISON:  Chest radiograph performed 03/05/2016 FINDINGS: The lungs are mildly hypoexpanded. Chronic bilateral fibrotic changes are similar in appearance to the prior study. No definite superimposed focal airspace consolidation is seen. No pleural effusion or pneumothorax is identified. The heart is borderline normal in size. No acute osseous abnormalities are seen. IMPRESSION: Lungs mildly hypoexpanded. Stable appearance to chronic pulmonary fibrosis. No superimposed focal airspace consolidation seen. Electronically Signed   By: Garald Balding M.D.   On: 03/15/2017 21:41   Ct Angio Chest Pe W Or Wo Contrast  Result Date: 03/16/2017 CLINICAL DATA:  Sudden onset shortness of breath EXAM: CT ANGIOGRAPHY CHEST WITH CONTRAST TECHNIQUE: Multidetector CT imaging of the chest was performed using the standard protocol during bolus administration of intravenous contrast. Multiplanar CT image reconstructions and MIPs were obtained to evaluate the vascular anatomy. CONTRAST:  71 mL Isovue 370 intravenous COMPARISON:  CT 03/16/2017, 07/05/2008 FINDINGS: Cardiovascular: Satisfactory opacification of the pulmonary arteries to the segmental level. Limited evaluation for emboli within the lower lobes, due to significant respiratory motion artifact. Allowing for this, no definite acute filling defects to suggest pulmonary embolus. Prominent central pulmonary vessels suggesting pulmonary hypertension. No aneurysmal dilatation of the aorta. Atherosclerotic calcifications. Common origin of the right brachiocephalic and left common carotid artery. Minimal coronary calcification. Normal heart size. No pericardial effusion. Mediastinum/Nodes: Midline trachea. No  thyroid mass. Re- demonstrated slightly enlarged mediastinal lymph nodes, for example pretracheal lymph node measures 13 mm. Moderate hiatal hernia. Lungs/Pleura: Re- demonstrated extensive pulmonary fibrosis with honeycombing and bronchiectasis. Right middle lobe pulmonary nodule more difficult to visualize due to respiratory motion. No pleural effusion, consolidation or pneumothorax Upper Abdomen: No acute interval changes.  Atrophic left kidney. Musculoskeletal: No chest wall abnormality. No acute or significant osseous findings. Review of the MIP images confirms the above findings. IMPRESSION: 1. Limited evaluation for emboli in the lower lobes secondary to respiratory motion. Allowing for this, no definite acute pulmonary embolus is visualized. 2. Prominent central pulmonary arteries consistent with pulmonary hypertension 3. Extensive pulmonary fibrosis as detailed on chest CT 03/16/2017. 4. Re- demonstrated mild mediastinal adenopathy 5. Atrophic left kidney Aortic Atherosclerosis (ICD10-I70.0). Electronically Signed   By: Donavan Foil M.D.   On: 03/16/2017 19:15   Ct Chest High Resolution  Result Date: 03/16/2017 CLINICAL DATA:  Worsening shortness of breath over the last couple days. Cough. History of pulmonary fibrosis. EXAM: CT CHEST WITHOUT CONTRAST TECHNIQUE: Multidetector CT imaging of the chest was performed following the standard protocol without intravenous contrast. High resolution imaging of the lungs, as well as inspiratory and expiratory imaging, was performed. COMPARISON:  Radiographs 03/15/2017 and 03/05/2016. Chest CT 07/05/2008. A more  recent chest CT performed 10/12/2010 unavailable. FINDINGS: Cardiovascular: Mild atherosclerosis of aorta, great vessels and coronary arteries. There is central enlargement of the pulmonary arteries consistent with pulmonary arterial hypertension. No acute vascular findings are demonstrated on noncontrast imaging. The heart size is normal. There is no  pericardial effusion. Mediastinum/Nodes: There are several mildly enlarged mediastinal and hilar lymph nodes, including a 14 mm right paratracheal node on image 44, a 14 mm low right paratracheal node on image 53 and a 10 mm subcarinal node on image 76. Hilar assessment is limited without contrast. No axillary adenopathy. A moderate size hiatal hernia has enlarged compared with the prior study. Lungs/Pleura: No pleural effusion or pneumothorax. There are progressive changes of severe pulmonary fibrosis with subpleural reticulation, fissural thickening, traction bronchiectasis, architectural distortion and extensive honeycomb formation in both lungs. 4 mm right middle lobe nodule on image 74 is stable. There is a small calcified right upper lobe granuloma. There are no new or enlarging pulmonary nodules. There is no focal airspace disease or ground-glass opacity. Inspiration and expiration imaging demonstrates no significant air trapping. Upper abdomen: The visualized upper abdomen appears stable without acute findings. Musculoskeletal/Chest wall: There is no chest wall mass or suspicious osseous finding. IMPRESSION: 1. Progressive changes of severe pulmonary fibrosis (usual interstitial pneumonitis pattern) compared with available chest CT from 2010. 2. No definite acute superimposed findings or suspicious pulmonary nodules. 3. Mildly prominent mediastinal and hilar lymph nodes are nonspecific, likely reactive. 4. Central enlargement of the pulmonary arteries, consistent with pulmonary arterial hypertension. Aortic and coronary artery atherosclerosis. 5. Moderate size hiatal hernia. Electronically Signed   By: Richardean Sale M.D.   On: 03/16/2017 09:24    Microbiology: No results found for this or any previous visit (from the past 240 hour(s)).   Labs: Basic Metabolic Panel: No results for input(s): NA, K, CL, CO2, GLUCOSE, BUN, CREATININE, CALCIUM, MG, PHOS in the last 168 hours. Liver Function Tests: No  results for input(s): AST, ALT, ALKPHOS, BILITOT, PROT, ALBUMIN in the last 168 hours. No results for input(s): LIPASE, AMYLASE in the last 168 hours. No results for input(s): AMMONIA in the last 168 hours. CBC: No results for input(s): WBC, NEUTROABS, HGB, HCT, MCV, PLT in the last 168 hours. Cardiac Enzymes: No results for input(s): CKTOTAL, CKMB, CKMBINDEX, TROPONINI in the last 168 hours. BNP: BNP (last 3 results) Recent Labs    03/15/17 2257  BNP 21.8    ProBNP (last 3 results) No results for input(s): PROBNP in the last 8760 hours.  CBG: Recent Labs  Lab 03/22/17 0814 03/23/17 0752 03/24/17 0751 03/25/17 0734 03/26/17 0725  GLUCAP 98 103* 97 85 87       Signed:  Ersie Savino MD.  [facility] 03/26/2017, 1:25 PM

## 2017-03-26 NOTE — Clinical Social Work Note (Signed)
CSW facilitated patient discharge including contacting patient family and facility to confirm patient discharge plans. Clinical information faxed to facility and family agreeable with plan. CSW arranged ambulance transport via [facility] to [place]. RN to call report prior to discharge [phone]).  CSW will sign off for now as social work intervention is no longer needed. Please consult [place] again if new needs arise.  Dayton Scrape, [place]

## 2017-03-26 NOTE — Progress Notes (Signed)
[facility]  Continue to follow for family interest in [facility]. Unfortunately [facility] does not have a room to offer to Mr. [place] today. Message sent to CSW Estanislado Emms making her aware.   Thank you,  Erling Conte, LCSW [phone]

## 2017-03-26 NOTE — Progress Notes (Signed)
Report called to Haze Justin, RN [facility] receiving nurse. [facility] has been arranged. Awaiting to transfer out. Spouse Peggy and son Gerald Stabs at the bedside and made aware of this plan. Davione Lenker [place] Kippy Gohman. RN, BSN

## 2017-03-26 NOTE — Progress Notes (Addendum)
Daily Progress Note   Patient Name: [name] [name]       Date: 03/26/2017 DOB: Aug 07, 1934  Age: 82 y.o. MRN#: [id number] Attending Physician: Domenic Polite, MD Primary Care Physician: [name], MD Admit Date: 03/15/2017  Reason for Consultation/Follow-up: Disposition, Establishing goals of care, Hospice Evaluation, Inpatient hospice referral and Non pain symptom management  Subjective: Patient resting in bed, wife at bedside. Report a good night, slept well.   Length of Stay: 10  Current Medications: Scheduled Meds:  . ALPRAZolam  0.25 mg Oral TID  . feeding supplement (ENSURE ENLIVE)  237 mL Oral TID  . morphine CONCENTRATE  10 mg Oral Q6H  . pantoprazole  40 mg Oral Daily  . senna  1 tablet Oral Daily  . sodium chloride flush  3 mL Intravenous Q12H  . tamsulosin  0.4 mg Oral Daily    Continuous Infusions:   PRN Meds: acetaminophen **OR** acetaminophen, ALPRAZolam, alum & mag hydroxide-simeth, bisacodyl, HYDROcodone-homatropine, LORazepam, morphine injection, morphine CONCENTRATE, ondansetron **OR** ondansetron (ZOFRAN) IV, temazepam  Physical Exam  Constitutional: He is oriented to person, place, and time. He appears well-nourished. He appears lethargic. No distress.  HENT:  Head: Normocephalic and atraumatic.  Cardiovascular: Normal rate, regular rhythm and intact distal pulses.  Pulmonary/Chest: Effort normal. No accessory muscle usage. No tachypnea. No respiratory distress.  Musculoskeletal:       Left forearm: He exhibits edema.  Non pitting  Neurological: He is oriented to person, place, and time. He appears lethargic.  Skin: Skin is warm and dry. Capillary refill takes less than 2 seconds.  Psychiatric: His mood appears not anxious. His speech is delayed. He is  slowed. He is inattentive.            Vital Signs: BP 130/67 (BP Location: Right Arm)   Pulse 81   Temp 98.1 F (36.7 C) (Axillary)   Resp (!) 21   Ht 5\' 7"  (1.702 m)   Wt 73.5 kg (162 lb)   SpO2 100%   BMI 25.37 kg/m  SpO2: SpO2: 100 % O2 Device: O2 Device: High Flow Nasal Cannula O2 Flow Rate: O2 Flow Rate (L/min): 12 L/min  Intake/output summary:   Intake/Output Summary (Last 24 hours) at 03/26/2017 0856 Last data filed at 03/26/2017 0500 Gross per 24 hour  Intake 600 ml  Output 800 ml  Net -200 ml   LBM: Last BM Date: 03/25/17 Baseline Weight: Weight: 72.8 kg (160 lb 8 oz) Most recent weight: Weight: 73.5 kg (162 lb)       Palliative Assessment/Data:20%    Flowsheet Rows     Most Recent Value  Intake Tab  Referral Department  Critical care  Unit at Time of Referral  Med/Surg Unit  Palliative Care Primary Diagnosis  Pulmonary  Date Notified  03/17/17  Palliative Care Type  New Palliative care  Reason for referral  Non-pain Symptom, Clarify Goals of Care, Counsel Regarding Hospice, Psychosocial or Spiritual support  Date of Admission  03/15/17  Date first seen by Palliative Care  03/17/17  # of days Palliative referral response time  0 Day(s)  # of days IP prior to Palliative referral  2  Clinical Assessment  Palliative Performance Scale  Score  30%  Pain Max last 24 hours  Not able to report  Pain Min Last 24 hours  Not able to report  Dyspnea Max Last 24 Hours  Not able to report  Dyspnea Min Last 24 hours  Not able to report  Nausea Max Last 24 Hours  Not able to report  Nausea Min Last 24 Hours  Not able to report  Anxiety Max Last 24 Hours  Not able to report  Anxiety Min Last 24 Hours  Not able to report  Other Max Last 24 Hours  Not able to report  Psychosocial & Spiritual Assessment  Palliative Care Outcomes  Patient/Family meeting held?  Yes  Who was at the meeting?  pt, wife and 2 sons  Palliative Care Outcomes  Improved non-pain symptom  therapy, Clarified goals of care, Counseled regarding hospice, Provided advance care planning, Changed to focus on comfort  Patient/Family wishes: Interventions discontinued/not started   Mechanical Ventilation  Palliative Care follow-up planned  Yes, Facility      Patient Active Problem List   Diagnosis Date Noted  . Shortness of breath   . Advance care planning   . Palliative care by specialist   . Anxiety 03/16/2017  . Dyspnea on exertion   . Community acquired pneumonia   . Sepsis ([place])   . Acute renal failure superimposed on stage 3 chronic kidney disease ([place])   . Chronic pain syndrome 03/06/2016  . Influenza 03/05/2016  . Hyponatremia 03/05/2016  . CKD (chronic kidney disease) stage 3, GFR 30-59 ml/min (HCC) 03/05/2016  . Thrombocytopenia ([place]) 03/05/2016  . Acute on chronic respiratory failure with hypoxia ([place]) 03/05/2016  . Hx of bladder cancer 08/26/2014  . Patient in clinical research study 07/18/2014  . Chronic respiratory failure with hypoxia ([place]) 06/17/2014  . IPF (idiopathic pulmonary fibrosis) ([place]) 06/16/2014  . Femoral hernia, left 08/27/2011  . Bilateral inguinal herniae (BIH), R>L 06/11/2011  . BRONCHITIS, ACUTE 06/08/2010  . Hyperlipidemia 06/04/2007  . Essential hypertension 06/04/2007  . G E R D 06/04/2007  . BRONCHIECTASIS 03/06/2007  . Pulmonary fibrosis ([place]) 03/06/2007    Palliative Care Assessment & Plan   HPI: 82 y.o.malewith PMHof interstitial pulmonary fibrosis, HLD, HTN, andcough,admitted on 10/26/2018with worsening shortness of breath. Patient has been living with interstitial pulmonary fibrosis for approximately 9 years and has been included in clinical trials at [facility]. His disease has progressed to the point where he is afraid to fall asleep at night. He is unable to lie flat. He has been on morphine 15 mg at bedtime for approximately 1 year. Additionally he has taken Xanax 0.25 mg for approximately 6 months, typically twice daily.  Spouse shares that sometimes she has had to "cheat" and give him 1-1/2 tablets because of severe anxiety. Patient reports prior to admission that morphine 15 mg at bedtime was no longer managing his symptoms nor was itsedating.   Assessment: Patient and wife both relieved with patient's response to medications - decreased shortness of breath. Coughing spells lessened. Sleeping better. Wife reports decreased appetite, taking small bites and sips. Hasn't had a bowel movement in several days, no GI complaints. Taking scheduled senna. Does not want to increase medications. Patient is worried about condom catheter leaking and wife is worried he is too drowsy to urinate. Discussed indwelling catheter and patient and wife would like to try.  All questions and concerns addressed. Emotional support provided.   Recommendations/Plan:  Order indwelling foley for end of life care  Transition to  residential hospice when bed is available - will need doses of morphine/xanax prior to transfer for symptom control  Utilize prn morphine,xanax, and ativanfor dyspnea and anxiety, continue scheduled morphine and xanax  Continue hycodan for cough  Scheduled senna to prevent constipation, prn dulcolax  Goals of Care and Additional Recommendations:  Limitations on Scope of Treatment: Full Comfort Care, Minimize Medications, Initiate Comfort Feeding, No Artificial Feeding, No Blood Transfusions, No Diagnostics, No Glucose Monitoring, No IV Antibiotics, No IV Fluids and No Lab Draws  Code Status:  DNR  Prognosis:  Days  Discharge Planning:  Hospice facility vs hospital death   No beds available at [facility] or [facility]. Wife cannot provide level of care patient needs at home.   Care plan was discussed with patient, wife, bedside nurse  Thank you for allowing the Palliative Medicine Team to assist in the care of this patient.   Total Time 30 minutes Prolonged Time Billed  no        Greater than 50%  of this time was spent counseling and coordinating care related to the above assessment and plan.  Juel Burrow, DNP, AGNP-C Palliative Medicine Team Team Phone # [phone]

## 2017-03-26 NOTE — Clinical Social Work Note (Signed)
CSW left voicemail for referral coordinator for [facility] to check status of admission for today.  Dayton Scrape, [place]

## 2017-03-26 NOTE — Progress Notes (Addendum)
10:49 CSW and RNCM met with patient and daughter-in-law at bedside. RNCM issued HINN. CSW informed patient that [facility] continues to have no bed availability and advised to choose an alternate facility. Patient deferred to his wife for decision making.   CSW and RNCM called wife and discussed HINN and need to choose alternate hospice facility. CSW informed wife that [place] does have beds available, though [place] and [place] do not. Wife indicated she needs to discuss with family and will call CSW back with decision.   12:56 CSW met with patient, spouse, and son at bedside. Patient and family now agreeable to [place]. CSW made referral to [facility] and liaison will come meet with patient and family today. CSW to follow and support with discharge plan.  Estanislado Emms, [place]

## 2017-03-28 ENCOUNTER — Encounter (HOSPITAL_COMMUNITY): Payer: Self-pay

## 2017-04-02 ENCOUNTER — Encounter (HOSPITAL_COMMUNITY): Payer: Self-pay

## 2017-04-04 ENCOUNTER — Encounter (HOSPITAL_COMMUNITY): Payer: Self-pay

## 2017-04-09 ENCOUNTER — Encounter (HOSPITAL_COMMUNITY): Payer: Self-pay

## 2017-04-16 ENCOUNTER — Encounter (HOSPITAL_COMMUNITY): Payer: Self-pay

## 2017-04-20 DEATH — deceased

## 2019-02-18 IMAGING — CT CT ANGIO CHEST
2 of 8 series · 18 of 46 positions shown · IV contrast (isovue)
Comparison: CT 03/16/2017, 07/05/2008

CLINICAL DATA: Sudden onset shortness of breath

EXAM:
CT ANGIOGRAPHY CHEST WITH CONTRAST
TECHNIQUE: Multidetector CT imaging of the chest was performed using the
standard protocol during bolus administration of intravenous
contrast. Multiplanar CT image reconstructions and MIPs were
obtained to evaluate the vascular anatomy.
CONTRAST:  71 mL Isovue 370 intravenous

[Series 6: thins · axial · 0.92mm/px · z∈[+1349,+1618]mm · 15 of 297 slices shown]
[im 14/297  lung]
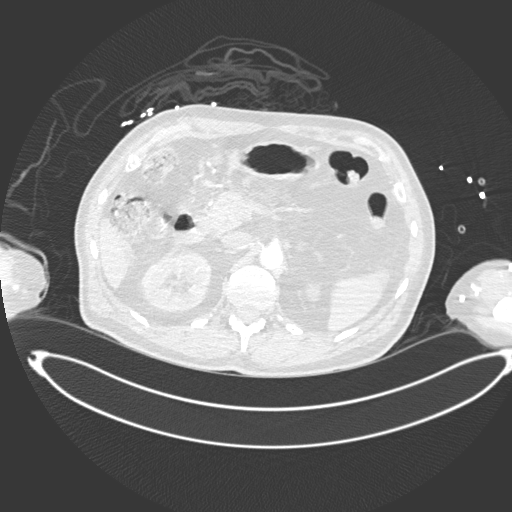
[im 41/297  soft-tissue]
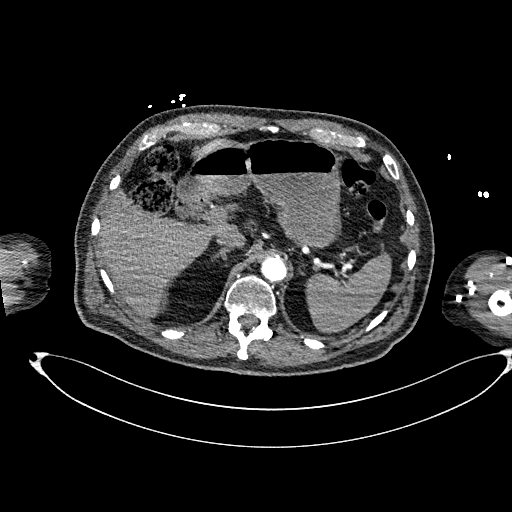
[im 54/297  lung]
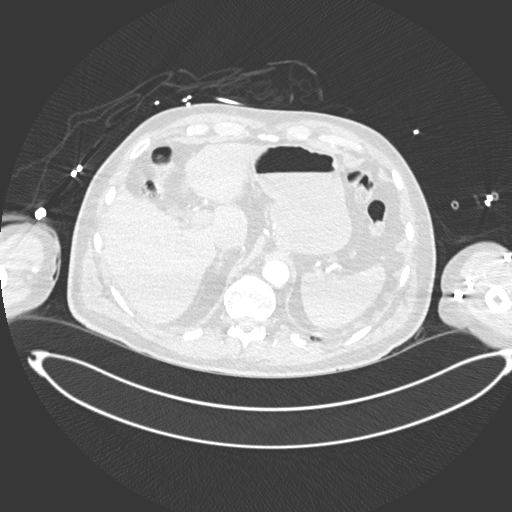
[im 68/297  soft-tissue]
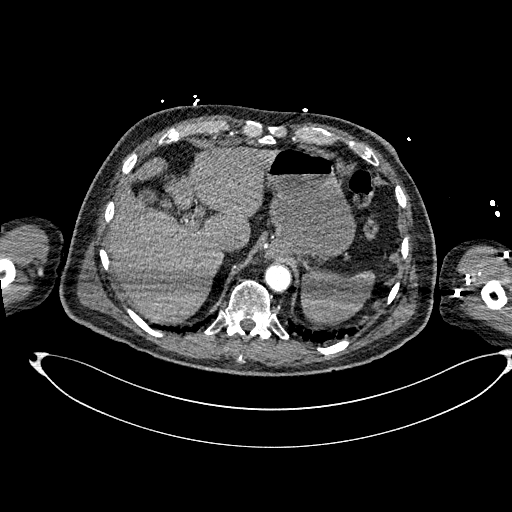
[im 95/297  lung]
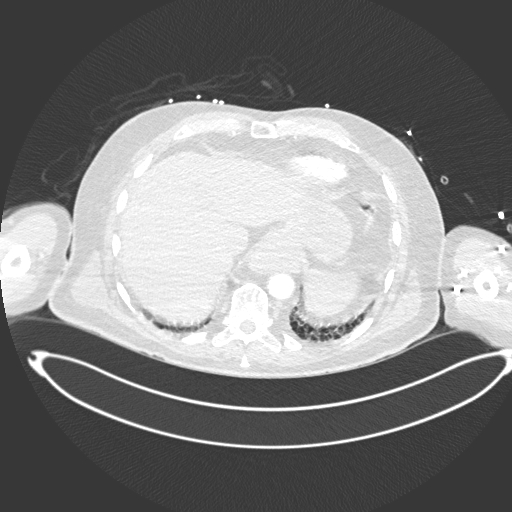
[im 108/297  soft-tissue]
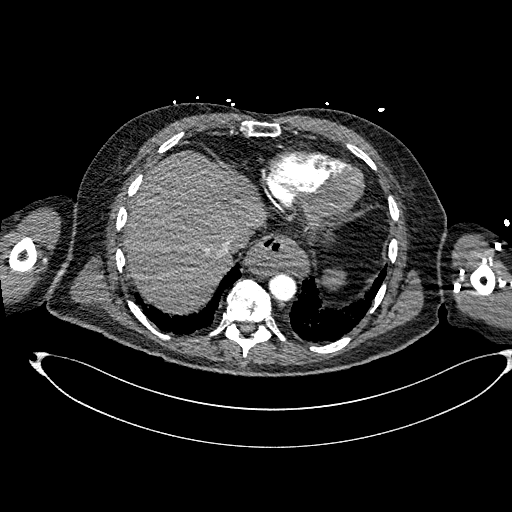
[im 135/297  lung]
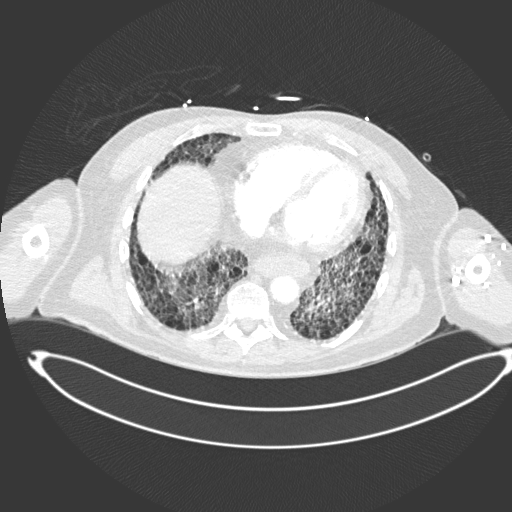
[im 149/297  soft-tissue]
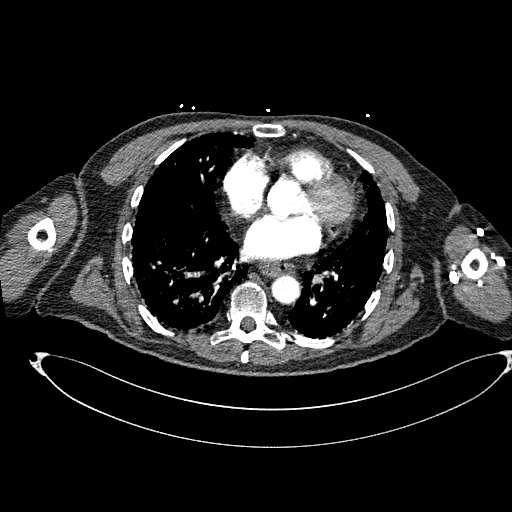
[im 162/297  lung]
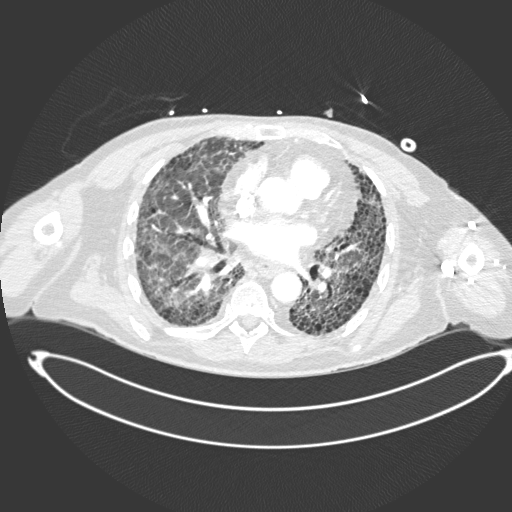
[im 189/297  soft-tissue]
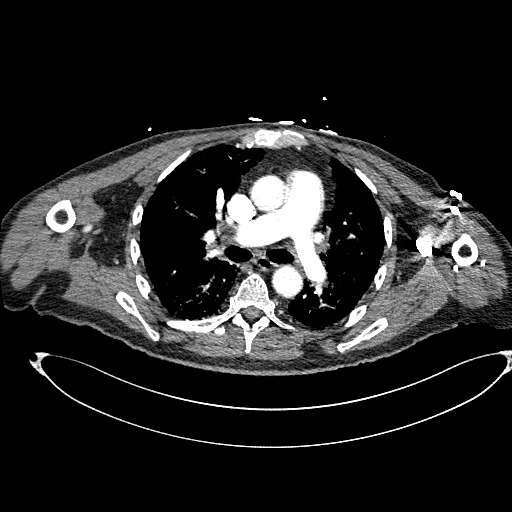
[im 202/297  lung]
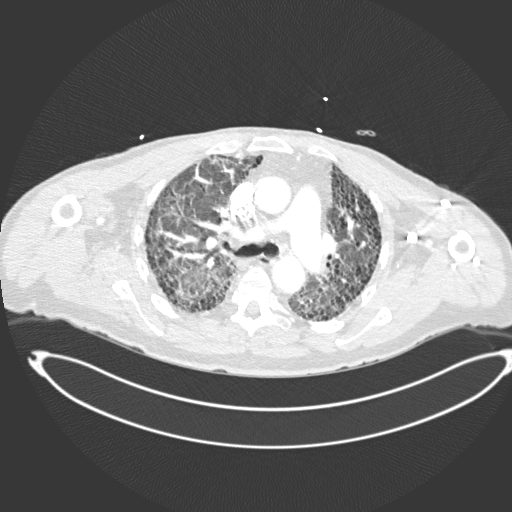
[im 229/297  soft-tissue]
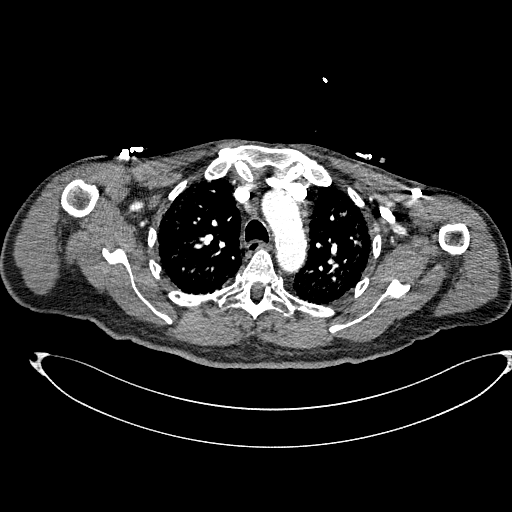
[im 243/297  lung]
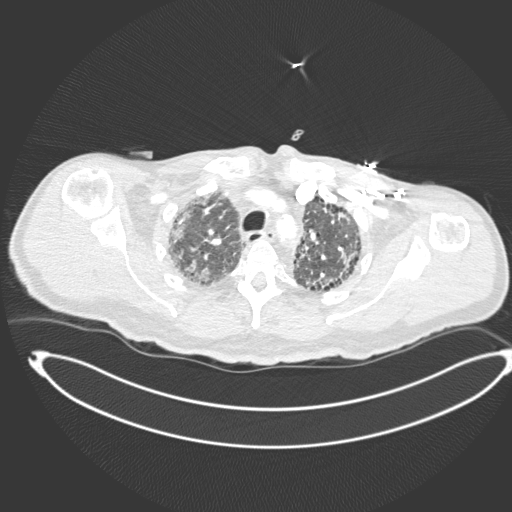
[im 256/297  soft-tissue]
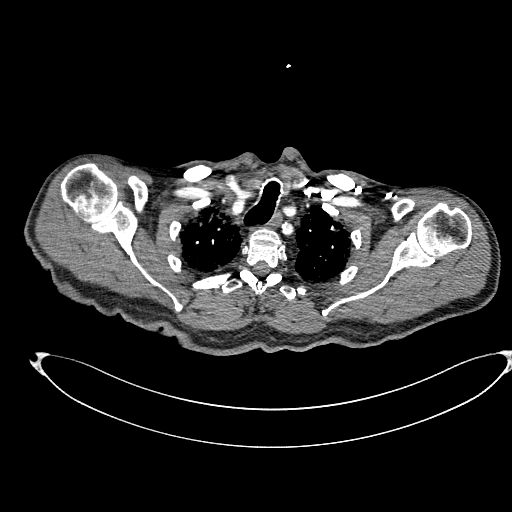
[im 283/297  lung]
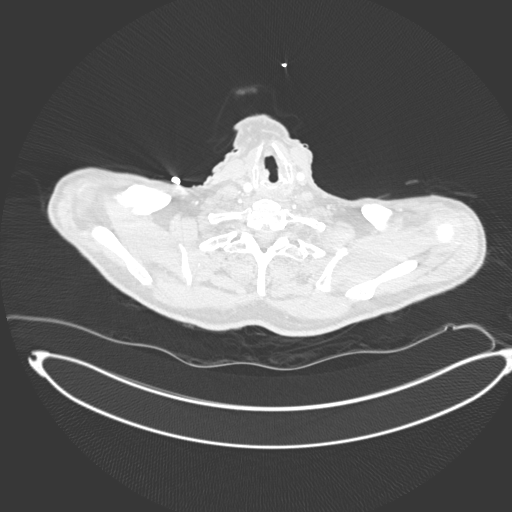

[Series 8: coronal mpr · coronal · 0.59mm/px · 3 of 151 slices shown]
[im 38/151  soft-tissue]
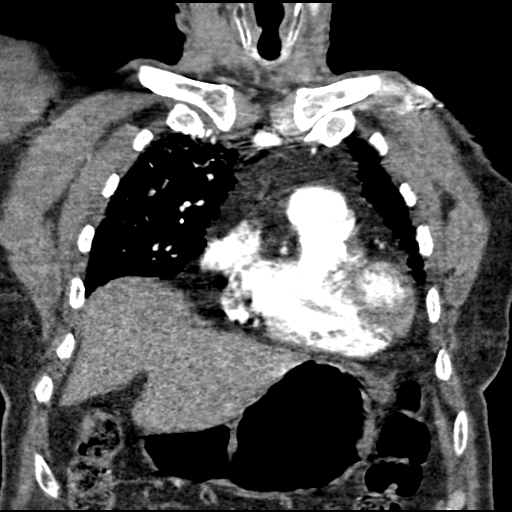
[im 76/151  soft-tissue]
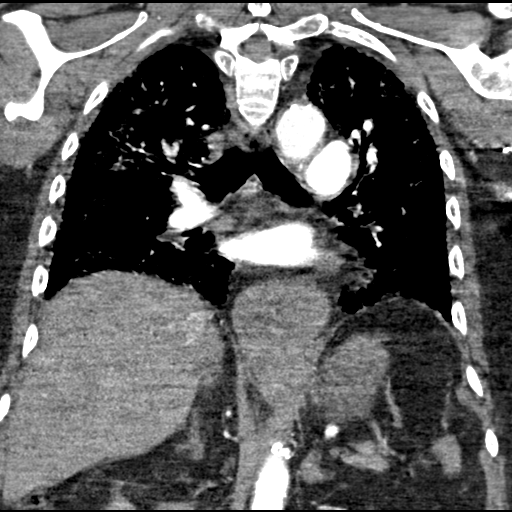
[im 113/151  soft-tissue]
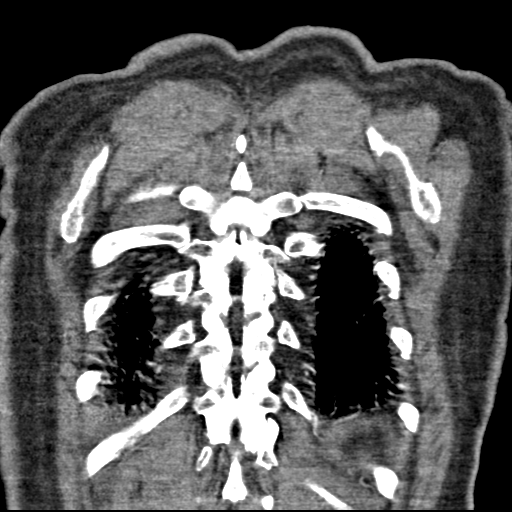

[18 of 46 positions shown; findings below may reference images not displayed]

FINDINGS: Cardiovascular: Satisfactory opacification of the pulmonary arteries
to the segmental level. Limited evaluation for emboli within the
lower lobes, due to significant respiratory motion artifact.
Allowing for this, no definite acute filling defects to suggest
pulmonary embolus. Prominent central pulmonary vessels suggesting
pulmonary hypertension. No aneurysmal dilatation of the aorta.
Atherosclerotic calcifications. Common origin of the right
brachiocephalic and left common carotid artery. Minimal coronary
calcification. Normal heart size. No pericardial effusion.

Mediastinum/Nodes: Midline trachea. No thyroid mass. Re-
demonstrated slightly enlarged mediastinal lymph nodes, for example
pretracheal lymph node measures 13 mm. Moderate hiatal hernia.

Lungs/Pleura: Re- demonstrated extensive pulmonary fibrosis with
honeycombing and bronchiectasis. Right middle lobe pulmonary nodule
more difficult to visualize due to respiratory motion. No pleural
effusion, consolidation or pneumothorax

Upper Abdomen: No acute interval changes.  Atrophic left kidney.

Musculoskeletal: No chest wall abnormality. No acute or significant
osseous findings.

Review of the MIP images confirms the above findings.
IMPRESSION: 1. Limited evaluation for emboli in the lower lobes secondary to
respiratory motion. Allowing for this, no definite acute pulmonary
embolus is visualized.
2. Prominent central pulmonary arteries consistent with pulmonary
hypertension
3. Extensive pulmonary fibrosis as detailed on chest CT 03/16/2017.
4. Re- demonstrated mild mediastinal adenopathy
5. Atrophic left kidney

Aortic Atherosclerosis ([id number]).

## 2019-02-18 IMAGING — CT CT CHEST HIGH RESOLUTION W/O CM
2 of 5 series · 15 of 36 positions shown, 18 images · non-contrast
Comparison: Radiographs 03/15/2017 and 03/05/2016. Chest CT
07/05/2008. A more recent chest CT performed 10/12/2010 unavailable.

CLINICAL DATA: Worsening shortness of breath over the last couple
days. Cough. History of pulmonary fibrosis.

EXAM:
CT CHEST WITHOUT CONTRAST
TECHNIQUE: Multidetector CT imaging of the chest was performed following the
standard protocol without intravenous contrast. High resolution
imaging of the lungs, as well as inspiratory and expiratory imaging,
was performed.

[Series 2: chest w/o 5mm st · axial · non-contrast · 0.68mm/px · z∈[+1084,+1370]mm · 12 of 159 slices shown, 15 images]
[im 8/159  mediastinal]
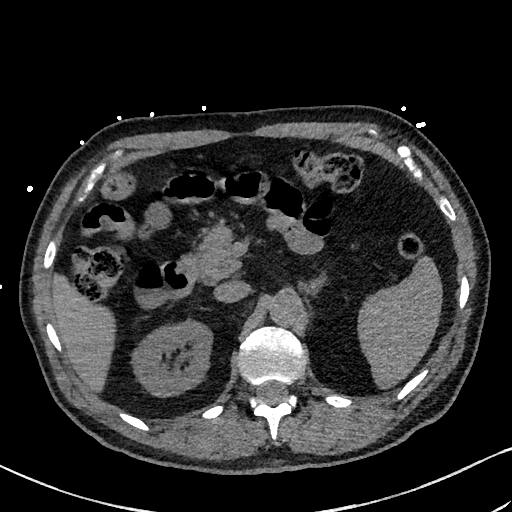
[im 8/159  lung]
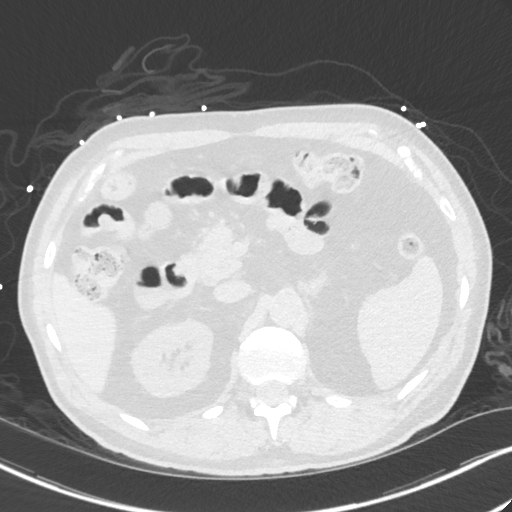
[im 23/159  lung]
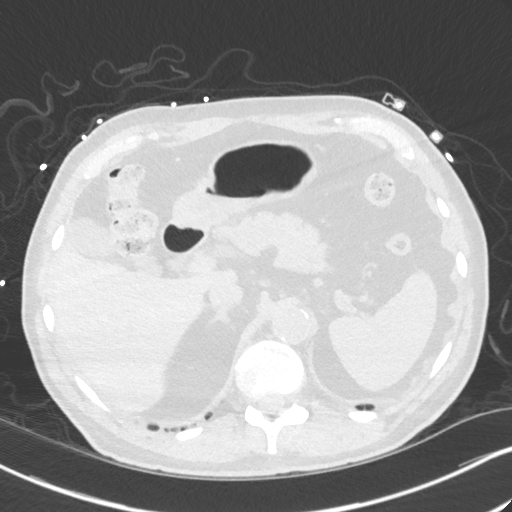
[im 38/159  lung]
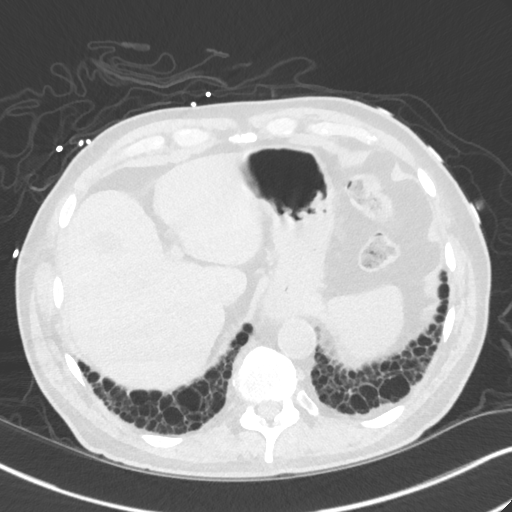
[im 46/159  lung]
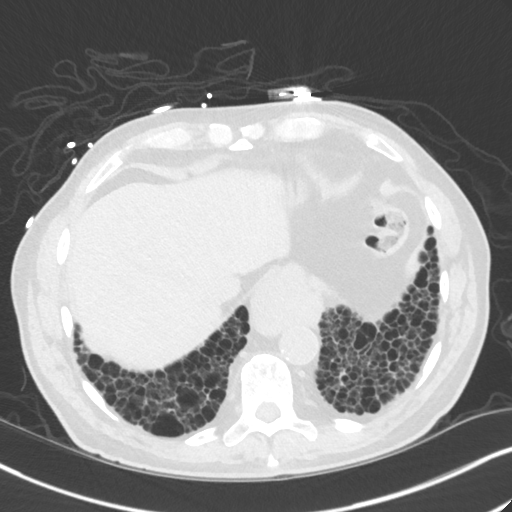
[im 61/159  mediastinal]
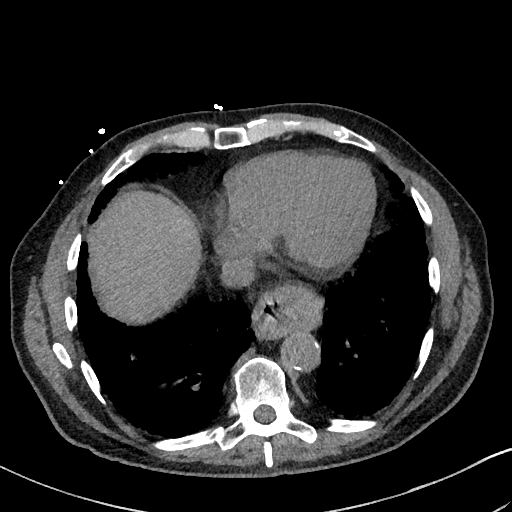
[im 61/159  lung]
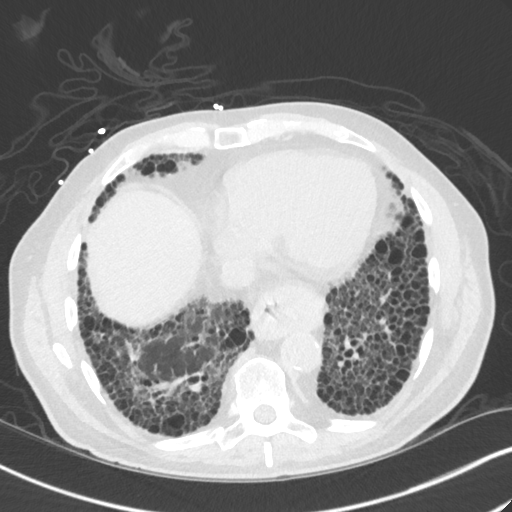
[im 76/159  lung]
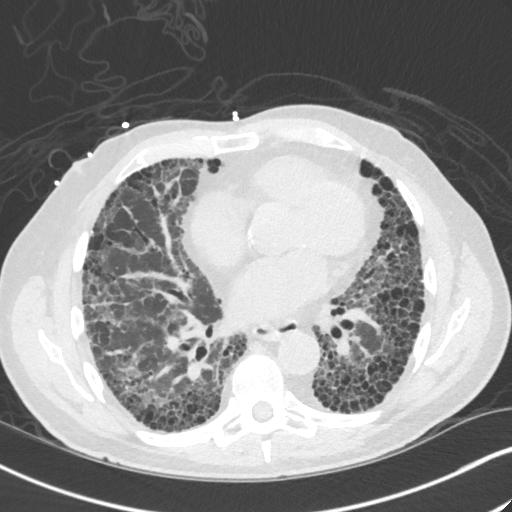
[im 83/159  lung]
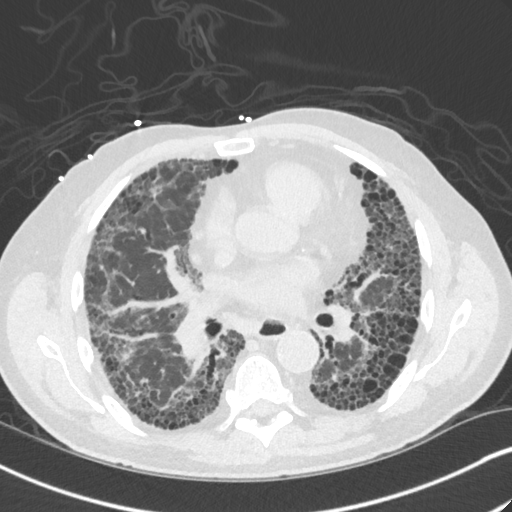
[im 98/159  lung]
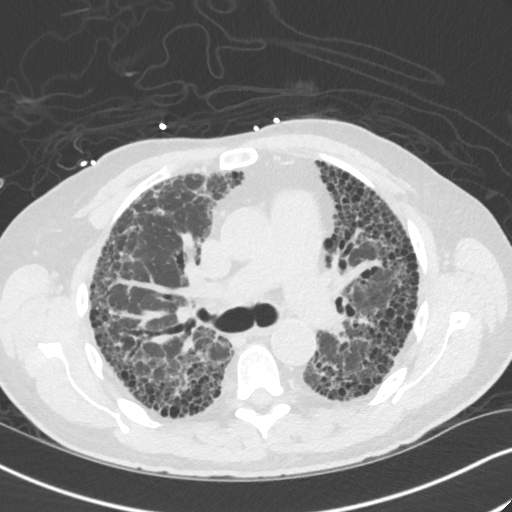
[im 113/159  mediastinal]
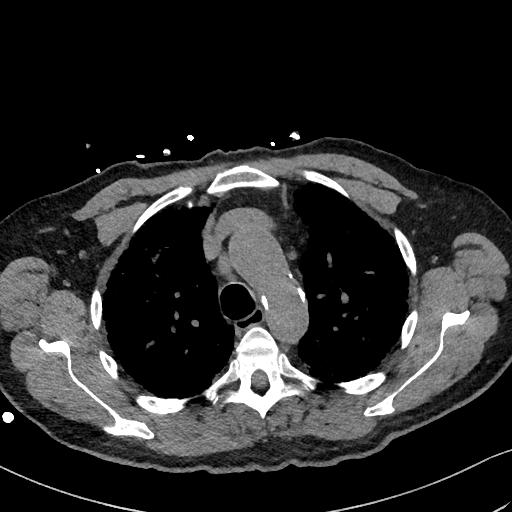
[im 113/159  lung]
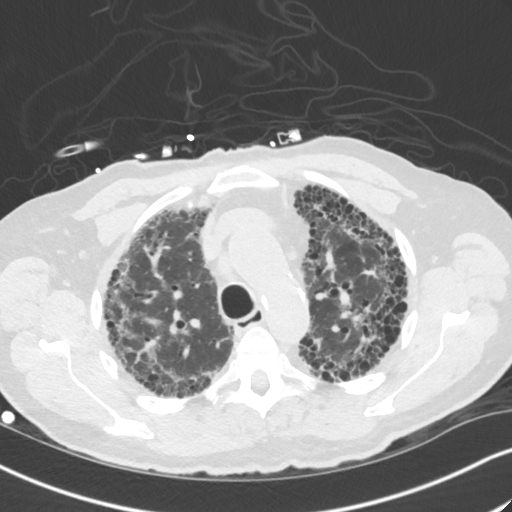
[im 121/159  lung]
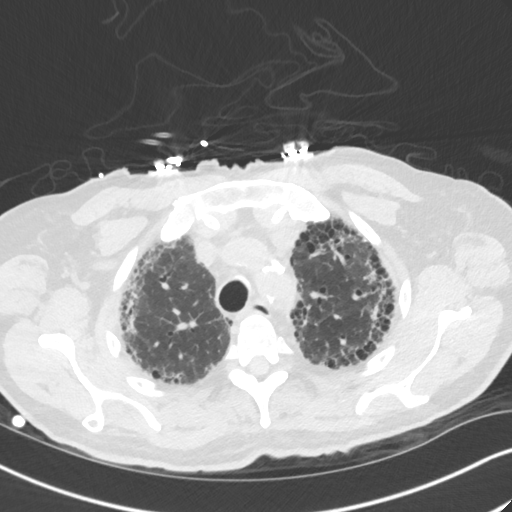
[im 136/159  lung]
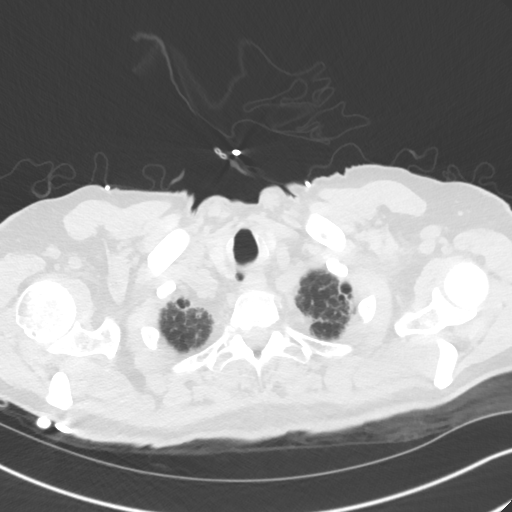
[im 151/159  lung]
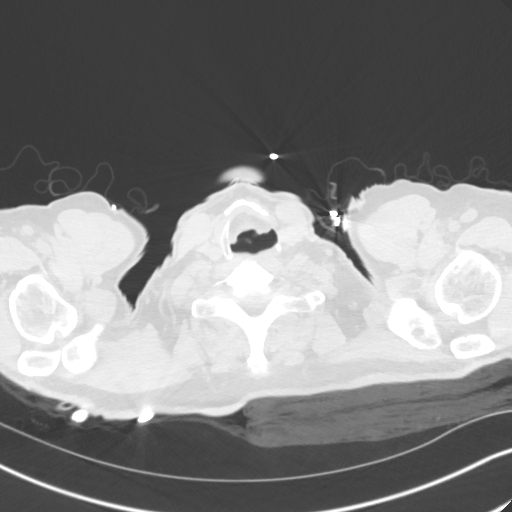

[Series 4: chest w/o 3mm st cor · coronal · non-contrast · 0.62mm/px · 3 of 86 slices shown]
[im 18/86  lung]
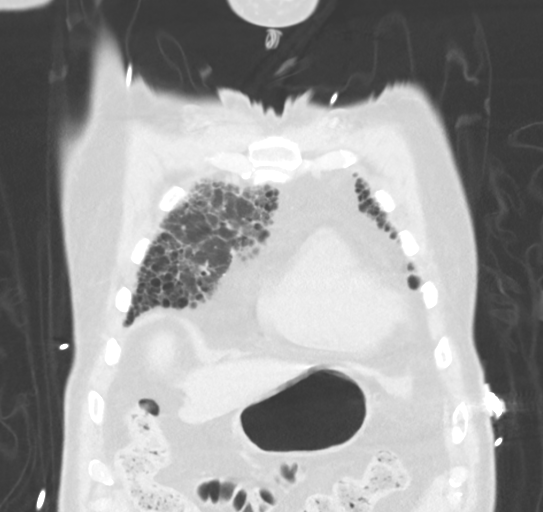
[im 35/86  lung]
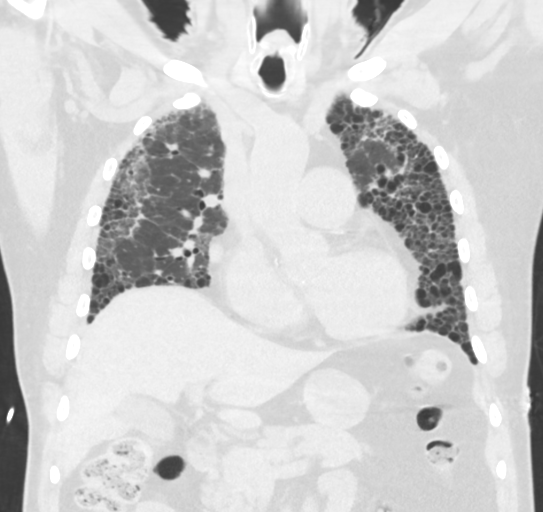
[im 52/86  lung]
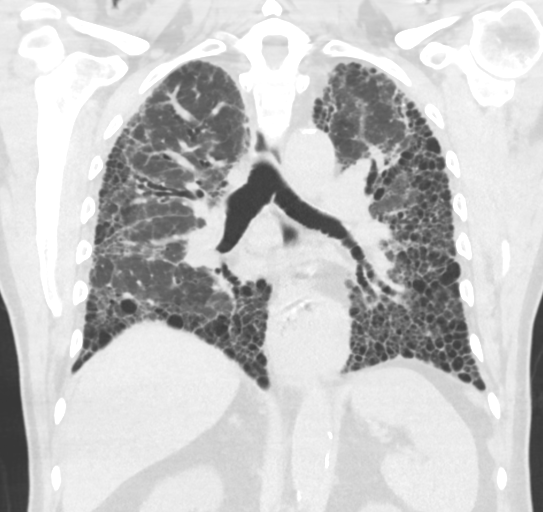

[15 of 36 positions shown; findings below may reference images not displayed]

FINDINGS: Cardiovascular: Mild atherosclerosis of aorta, great vessels and
coronary arteries. There is central enlargement of the pulmonary
arteries consistent with pulmonary arterial hypertension. No acute
vascular findings are demonstrated on noncontrast imaging. The heart
size is normal. There is no pericardial effusion.

Mediastinum/Nodes: There are several mildly enlarged mediastinal and
hilar lymph nodes, including a 14 mm right paratracheal node on
image 44, a 14 mm low right paratracheal node on image 53 and a 10
mm subcarinal node on image 76. Hilar assessment is limited without
contrast. No axillary adenopathy. A moderate size hiatal hernia has
enlarged compared with the prior study.

Lungs/Pleura: No pleural effusion or pneumothorax. There are
progressive changes of severe pulmonary fibrosis with subpleural
reticulation, fissural thickening, traction bronchiectasis,
architectural distortion and extensive honeycomb formation in both
lungs. 4 mm right middle lobe nodule on image 74 is stable. There is
a small calcified right upper lobe granuloma. There are no new or
enlarging pulmonary nodules. There is no focal airspace disease or
ground-glass opacity. Inspiration and expiration imaging
demonstrates no significant air trapping.

Upper abdomen: The visualized upper abdomen appears stable without
acute findings.

Musculoskeletal/Chest wall: There is no chest wall mass or
suspicious osseous finding.
IMPRESSION: 1. Progressive changes of severe pulmonary fibrosis (usual
interstitial pneumonitis pattern) compared with available chest CT
from 3656.
2. No definite acute superimposed findings or suspicious pulmonary
nodules.
3. Mildly prominent mediastinal and hilar lymph nodes are
nonspecific, likely reactive.
4. Central enlargement of the pulmonary arteries, consistent with
pulmonary arterial hypertension. Aortic and coronary artery
atherosclerosis.
5. Moderate size hiatal hernia.
